# Patient Record
Sex: Female | Born: 1965
Health system: Southern US, Community
[De-identification: ages and names within clinical notes are randomized; demographics above are authoritative.]

## PROBLEM LIST (undated history)

## (undated) DIAGNOSIS — F988 Other specified behavioral and emotional disorders with onset usually occurring in childhood and adolescence: Secondary | ICD-10-CM

## (undated) DIAGNOSIS — E538 Deficiency of other specified B group vitamins: Secondary | ICD-10-CM

## (undated) DIAGNOSIS — C801 Malignant (primary) neoplasm, unspecified: Secondary | ICD-10-CM

## (undated) DIAGNOSIS — E039 Hypothyroidism, unspecified: Secondary | ICD-10-CM

## (undated) HISTORY — DX: Other specified behavioral and emotional disorders with onset usually occurring in childhood and adolescence: F98.8

## (undated) HISTORY — DX: Deficiency of other specified B group vitamins: E53.8

## (undated) HISTORY — PX: ANAL FISSURE REPAIR: SHX2312

## (undated) HISTORY — DX: Malignant (primary) neoplasm, unspecified: C80.1

## (undated) HISTORY — DX: Hypothyroidism, unspecified: E03.9

## (undated) HISTORY — PX: OTHER SURGICAL HISTORY: SHX169

---

## 2003-12-08 ENCOUNTER — Encounter: Admission: RE | Admit: 2003-12-08 | Discharge: 2003-12-08 | Payer: Self-pay | Admitting: Family Medicine

## 2004-01-22 ENCOUNTER — Ambulatory Visit (HOSPITAL_COMMUNITY): Admission: RE | Admit: 2004-01-22 | Discharge: 2004-01-22 | Payer: Self-pay | Admitting: Family Medicine

## 2004-04-24 ENCOUNTER — Other Ambulatory Visit: Admission: RE | Admit: 2004-04-24 | Discharge: 2004-04-24 | Payer: Self-pay | Admitting: Obstetrics and Gynecology

## 2004-08-02 ENCOUNTER — Encounter: Admission: RE | Admit: 2004-08-02 | Discharge: 2004-08-02 | Payer: Self-pay | Admitting: Family Medicine

## 2004-09-01 ENCOUNTER — Ambulatory Visit: Payer: Self-pay | Admitting: Family Medicine

## 2004-12-27 ENCOUNTER — Ambulatory Visit: Payer: Self-pay | Admitting: Family Medicine

## 2005-07-10 ENCOUNTER — Ambulatory Visit: Payer: Self-pay | Admitting: Family Medicine

## 2005-07-13 ENCOUNTER — Ambulatory Visit: Payer: Self-pay | Admitting: Family Medicine

## 2005-07-18 ENCOUNTER — Ambulatory Visit: Payer: Self-pay | Admitting: Family Medicine

## 2005-08-01 ENCOUNTER — Other Ambulatory Visit: Admission: RE | Admit: 2005-08-01 | Discharge: 2005-08-01 | Payer: Self-pay | Admitting: Obstetrics and Gynecology

## 2005-12-27 ENCOUNTER — Ambulatory Visit: Payer: Self-pay | Admitting: Family Medicine

## 2006-01-11 ENCOUNTER — Ambulatory Visit: Payer: Self-pay | Admitting: Family Medicine

## 2006-01-31 ENCOUNTER — Ambulatory Visit (HOSPITAL_COMMUNITY): Admission: RE | Admit: 2006-01-31 | Discharge: 2006-01-31 | Payer: Self-pay | Admitting: General Surgery

## 2006-08-07 ENCOUNTER — Ambulatory Visit: Payer: Self-pay | Admitting: Family Medicine

## 2006-09-26 ENCOUNTER — Ambulatory Visit: Payer: Self-pay | Admitting: Internal Medicine

## 2007-01-21 DIAGNOSIS — E039 Hypothyroidism, unspecified: Secondary | ICD-10-CM | POA: Insufficient documentation

## 2007-01-21 DIAGNOSIS — F988 Other specified behavioral and emotional disorders with onset usually occurring in childhood and adolescence: Secondary | ICD-10-CM | POA: Insufficient documentation

## 2007-01-21 DIAGNOSIS — J302 Other seasonal allergic rhinitis: Secondary | ICD-10-CM | POA: Insufficient documentation

## 2007-01-21 DIAGNOSIS — J3089 Other allergic rhinitis: Secondary | ICD-10-CM

## 2007-01-21 DIAGNOSIS — Z9889 Other specified postprocedural states: Secondary | ICD-10-CM | POA: Insufficient documentation

## 2007-02-14 ENCOUNTER — Ambulatory Visit: Payer: Self-pay | Admitting: Family Medicine

## 2007-02-14 LAB — CONVERTED CEMR LAB
T3, Free: 2.7 pg/mL (ref 2.3–4.2)
T4, Total: 11.5 ug/dL (ref 5.0–12.5)
TSH: 0.997 microintl units/mL (ref 0.350–5.50)

## 2007-03-04 ENCOUNTER — Telehealth (INDEPENDENT_AMBULATORY_CARE_PROVIDER_SITE_OTHER): Payer: Self-pay | Admitting: *Deleted

## 2007-03-24 ENCOUNTER — Telehealth (INDEPENDENT_AMBULATORY_CARE_PROVIDER_SITE_OTHER): Payer: Self-pay | Admitting: *Deleted

## 2007-04-22 ENCOUNTER — Telehealth (INDEPENDENT_AMBULATORY_CARE_PROVIDER_SITE_OTHER): Payer: Self-pay | Admitting: *Deleted

## 2007-05-23 ENCOUNTER — Telehealth (INDEPENDENT_AMBULATORY_CARE_PROVIDER_SITE_OTHER): Payer: Self-pay | Admitting: *Deleted

## 2007-06-23 ENCOUNTER — Telehealth (INDEPENDENT_AMBULATORY_CARE_PROVIDER_SITE_OTHER): Payer: Self-pay | Admitting: *Deleted

## 2007-07-21 ENCOUNTER — Telehealth (INDEPENDENT_AMBULATORY_CARE_PROVIDER_SITE_OTHER): Payer: Self-pay | Admitting: *Deleted

## 2007-08-20 ENCOUNTER — Telehealth (INDEPENDENT_AMBULATORY_CARE_PROVIDER_SITE_OTHER): Payer: Self-pay | Admitting: *Deleted

## 2007-09-19 ENCOUNTER — Telehealth (INDEPENDENT_AMBULATORY_CARE_PROVIDER_SITE_OTHER): Payer: Self-pay | Admitting: *Deleted

## 2007-10-22 ENCOUNTER — Telehealth (INDEPENDENT_AMBULATORY_CARE_PROVIDER_SITE_OTHER): Payer: Self-pay | Admitting: *Deleted

## 2007-11-06 ENCOUNTER — Ambulatory Visit: Payer: Self-pay | Admitting: Family Medicine

## 2007-11-06 DIAGNOSIS — J4520 Mild intermittent asthma, uncomplicated: Secondary | ICD-10-CM | POA: Insufficient documentation

## 2007-11-20 ENCOUNTER — Telehealth (INDEPENDENT_AMBULATORY_CARE_PROVIDER_SITE_OTHER): Payer: Self-pay | Admitting: *Deleted

## 2007-12-18 ENCOUNTER — Telehealth (INDEPENDENT_AMBULATORY_CARE_PROVIDER_SITE_OTHER): Payer: Self-pay | Admitting: *Deleted

## 2008-01-19 ENCOUNTER — Telehealth (INDEPENDENT_AMBULATORY_CARE_PROVIDER_SITE_OTHER): Payer: Self-pay | Admitting: *Deleted

## 2008-02-17 ENCOUNTER — Telehealth (INDEPENDENT_AMBULATORY_CARE_PROVIDER_SITE_OTHER): Payer: Self-pay | Admitting: *Deleted

## 2008-03-11 ENCOUNTER — Ambulatory Visit: Payer: Self-pay | Admitting: Family Medicine

## 2008-03-12 ENCOUNTER — Encounter (INDEPENDENT_AMBULATORY_CARE_PROVIDER_SITE_OTHER): Payer: Self-pay | Admitting: *Deleted

## 2008-03-12 LAB — CONVERTED CEMR LAB: TSH: 0.31 microintl units/mL — ABNORMAL LOW (ref 0.35–5.50)

## 2008-03-24 ENCOUNTER — Telehealth (INDEPENDENT_AMBULATORY_CARE_PROVIDER_SITE_OTHER): Payer: Self-pay | Admitting: *Deleted

## 2008-03-24 DIAGNOSIS — K219 Gastro-esophageal reflux disease without esophagitis: Secondary | ICD-10-CM | POA: Insufficient documentation

## 2008-03-25 ENCOUNTER — Encounter (INDEPENDENT_AMBULATORY_CARE_PROVIDER_SITE_OTHER): Payer: Self-pay | Admitting: *Deleted

## 2008-04-19 ENCOUNTER — Telehealth (INDEPENDENT_AMBULATORY_CARE_PROVIDER_SITE_OTHER): Payer: Self-pay | Admitting: *Deleted

## 2008-04-20 ENCOUNTER — Ambulatory Visit: Payer: Self-pay | Admitting: Gastroenterology

## 2008-04-29 ENCOUNTER — Ambulatory Visit: Payer: Self-pay | Admitting: Gastroenterology

## 2008-05-07 ENCOUNTER — Ambulatory Visit: Payer: Self-pay | Admitting: Family Medicine

## 2008-05-07 DIAGNOSIS — J029 Acute pharyngitis, unspecified: Secondary | ICD-10-CM | POA: Insufficient documentation

## 2008-05-07 LAB — CONVERTED CEMR LAB: Rapid Strep: NEGATIVE

## 2008-05-08 ENCOUNTER — Encounter: Payer: Self-pay | Admitting: Family Medicine

## 2008-05-10 ENCOUNTER — Encounter (INDEPENDENT_AMBULATORY_CARE_PROVIDER_SITE_OTHER): Payer: Self-pay | Admitting: *Deleted

## 2008-05-10 ENCOUNTER — Telehealth (INDEPENDENT_AMBULATORY_CARE_PROVIDER_SITE_OTHER): Payer: Self-pay | Admitting: *Deleted

## 2008-05-10 ENCOUNTER — Encounter: Payer: Self-pay | Admitting: Family Medicine

## 2008-05-12 ENCOUNTER — Encounter: Payer: Self-pay | Admitting: Family Medicine

## 2008-05-18 ENCOUNTER — Telehealth (INDEPENDENT_AMBULATORY_CARE_PROVIDER_SITE_OTHER): Payer: Self-pay | Admitting: *Deleted

## 2008-06-15 ENCOUNTER — Telehealth (INDEPENDENT_AMBULATORY_CARE_PROVIDER_SITE_OTHER): Payer: Self-pay | Admitting: *Deleted

## 2008-06-16 ENCOUNTER — Ambulatory Visit: Payer: Self-pay | Admitting: Family Medicine

## 2008-06-16 ENCOUNTER — Telehealth (INDEPENDENT_AMBULATORY_CARE_PROVIDER_SITE_OTHER): Payer: Self-pay | Admitting: *Deleted

## 2008-06-16 LAB — CONVERTED CEMR LAB: TSH: 0.38 microintl units/mL (ref 0.35–5.50)

## 2008-06-17 ENCOUNTER — Encounter (INDEPENDENT_AMBULATORY_CARE_PROVIDER_SITE_OTHER): Payer: Self-pay | Admitting: *Deleted

## 2008-07-19 ENCOUNTER — Telehealth (INDEPENDENT_AMBULATORY_CARE_PROVIDER_SITE_OTHER): Payer: Self-pay | Admitting: *Deleted

## 2008-07-28 ENCOUNTER — Ambulatory Visit: Payer: Self-pay | Admitting: Family Medicine

## 2008-08-17 ENCOUNTER — Telehealth (INDEPENDENT_AMBULATORY_CARE_PROVIDER_SITE_OTHER): Payer: Self-pay | Admitting: *Deleted

## 2008-09-16 ENCOUNTER — Telehealth (INDEPENDENT_AMBULATORY_CARE_PROVIDER_SITE_OTHER): Payer: Self-pay | Admitting: *Deleted

## 2008-10-18 ENCOUNTER — Telehealth (INDEPENDENT_AMBULATORY_CARE_PROVIDER_SITE_OTHER): Payer: Self-pay | Admitting: *Deleted

## 2008-11-17 ENCOUNTER — Telehealth (INDEPENDENT_AMBULATORY_CARE_PROVIDER_SITE_OTHER): Payer: Self-pay | Admitting: *Deleted

## 2008-11-29 ENCOUNTER — Ambulatory Visit: Payer: Self-pay | Admitting: Family Medicine

## 2008-12-16 ENCOUNTER — Telehealth (INDEPENDENT_AMBULATORY_CARE_PROVIDER_SITE_OTHER): Payer: Self-pay | Admitting: *Deleted

## 2009-01-17 ENCOUNTER — Telehealth (INDEPENDENT_AMBULATORY_CARE_PROVIDER_SITE_OTHER): Payer: Self-pay | Admitting: *Deleted

## 2009-02-15 ENCOUNTER — Telehealth (INDEPENDENT_AMBULATORY_CARE_PROVIDER_SITE_OTHER): Payer: Self-pay | Admitting: *Deleted

## 2009-03-14 ENCOUNTER — Telehealth (INDEPENDENT_AMBULATORY_CARE_PROVIDER_SITE_OTHER): Payer: Self-pay | Admitting: *Deleted

## 2009-04-14 ENCOUNTER — Telehealth (INDEPENDENT_AMBULATORY_CARE_PROVIDER_SITE_OTHER): Payer: Self-pay | Admitting: *Deleted

## 2009-05-18 ENCOUNTER — Telehealth (INDEPENDENT_AMBULATORY_CARE_PROVIDER_SITE_OTHER): Payer: Self-pay | Admitting: *Deleted

## 2009-06-14 ENCOUNTER — Telehealth (INDEPENDENT_AMBULATORY_CARE_PROVIDER_SITE_OTHER): Payer: Self-pay | Admitting: *Deleted

## 2009-07-11 ENCOUNTER — Ambulatory Visit: Payer: Self-pay | Admitting: Family Medicine

## 2009-07-12 ENCOUNTER — Encounter: Payer: Self-pay | Admitting: Family Medicine

## 2009-07-12 LAB — CONVERTED CEMR LAB
Free T4: 1.2 ng/dL (ref 0.6–1.6)
T3, Free: 2.3 pg/mL (ref 2.3–4.2)
TSH: 0.41 microintl units/mL (ref 0.35–5.50)

## 2009-07-13 ENCOUNTER — Encounter: Payer: Self-pay | Admitting: Family Medicine

## 2009-08-09 ENCOUNTER — Telehealth (INDEPENDENT_AMBULATORY_CARE_PROVIDER_SITE_OTHER): Payer: Self-pay | Admitting: *Deleted

## 2009-08-15 ENCOUNTER — Telehealth (INDEPENDENT_AMBULATORY_CARE_PROVIDER_SITE_OTHER): Payer: Self-pay | Admitting: *Deleted

## 2009-09-12 ENCOUNTER — Telehealth (INDEPENDENT_AMBULATORY_CARE_PROVIDER_SITE_OTHER): Payer: Self-pay | Admitting: *Deleted

## 2009-10-12 ENCOUNTER — Telehealth (INDEPENDENT_AMBULATORY_CARE_PROVIDER_SITE_OTHER): Payer: Self-pay | Admitting: *Deleted

## 2009-11-14 ENCOUNTER — Telehealth (INDEPENDENT_AMBULATORY_CARE_PROVIDER_SITE_OTHER): Payer: Self-pay | Admitting: *Deleted

## 2009-12-12 ENCOUNTER — Telehealth (INDEPENDENT_AMBULATORY_CARE_PROVIDER_SITE_OTHER): Payer: Self-pay | Admitting: *Deleted

## 2010-01-11 ENCOUNTER — Telehealth (INDEPENDENT_AMBULATORY_CARE_PROVIDER_SITE_OTHER): Payer: Self-pay | Admitting: *Deleted

## 2010-02-09 ENCOUNTER — Telehealth (INDEPENDENT_AMBULATORY_CARE_PROVIDER_SITE_OTHER): Payer: Self-pay | Admitting: *Deleted

## 2010-02-14 ENCOUNTER — Ambulatory Visit: Payer: Self-pay | Admitting: Family Medicine

## 2010-03-13 ENCOUNTER — Telehealth (INDEPENDENT_AMBULATORY_CARE_PROVIDER_SITE_OTHER): Payer: Self-pay | Admitting: *Deleted

## 2010-04-05 ENCOUNTER — Telehealth (INDEPENDENT_AMBULATORY_CARE_PROVIDER_SITE_OTHER): Payer: Self-pay | Admitting: *Deleted

## 2010-05-11 ENCOUNTER — Telehealth (INDEPENDENT_AMBULATORY_CARE_PROVIDER_SITE_OTHER): Payer: Self-pay | Admitting: *Deleted

## 2010-06-12 ENCOUNTER — Telehealth (INDEPENDENT_AMBULATORY_CARE_PROVIDER_SITE_OTHER): Payer: Self-pay | Admitting: *Deleted

## 2010-07-11 ENCOUNTER — Telehealth (INDEPENDENT_AMBULATORY_CARE_PROVIDER_SITE_OTHER): Payer: Self-pay | Admitting: *Deleted

## 2010-08-10 ENCOUNTER — Telehealth (INDEPENDENT_AMBULATORY_CARE_PROVIDER_SITE_OTHER): Payer: Self-pay | Admitting: *Deleted

## 2010-08-18 ENCOUNTER — Ambulatory Visit: Payer: Self-pay | Admitting: Family Medicine

## 2010-08-21 ENCOUNTER — Telehealth (INDEPENDENT_AMBULATORY_CARE_PROVIDER_SITE_OTHER): Payer: Self-pay | Admitting: *Deleted

## 2010-08-21 LAB — CONVERTED CEMR LAB: TSH: 0.06 microintl units/mL — ABNORMAL LOW (ref 0.35–5.50)

## 2010-09-08 ENCOUNTER — Telehealth (INDEPENDENT_AMBULATORY_CARE_PROVIDER_SITE_OTHER): Payer: Self-pay | Admitting: *Deleted

## 2010-10-10 ENCOUNTER — Telehealth (INDEPENDENT_AMBULATORY_CARE_PROVIDER_SITE_OTHER): Payer: Self-pay | Admitting: *Deleted

## 2010-10-31 NOTE — Progress Notes (Signed)
Summary: refill  Phone Note Refill Request   Refills Requested: Medication #1:  RITALIN 10 MG  TABS 1 by mouth three times a day  Follow-up for Phone Call        left message rx was ready for pick up. Army Fossa CMA  March 13, 2010 1:21 PM     Prescriptions: RITALIN 10 MG  TABS (METHYLPHENIDATE HCL) 1 by mouth three times a day  #90 x 0   Entered by:   Army Fossa CMA   Authorized by:   Loreen Freud DO   Signed by:   Army Fossa CMA on 03/13/2010   Method used:   Print then Give to Patient   RxID:   [id number]

## 2010-10-31 NOTE — Progress Notes (Signed)
  Phone Note Refill Request   Refills Requested: Medication #1:  RITALIN 10 MG  TABS 1 by mouth three times a day  Follow-up for Phone Call        Pt is aware , rx is ready for pick up. Army Fossa CMA  November 14, 2009 11:58 AM     Prescriptions: RITALIN 10 MG  TABS (METHYLPHENIDATE HCL) 1 by mouth three times a day  #90 x 0   Entered by:   Army Fossa CMA   Authorized by:   Loreen Freud DO   Signed by:   Army Fossa CMA on 11/14/2009   Method used:   Print then Give to Patient   RxID:   [id number]

## 2010-10-31 NOTE — Assessment & Plan Note (Signed)
Summary: rto 6 months/cbs   Vital Signs:  Patient profile:   45 year old female Height:      67 inches Weight:      160 pounds BMI:     25.15 Temp:     98.1 degrees F Pulse rate:   76 / minute BP sitting:   110 / 78  (left arm)  Vitals Entered By: Jeremy Johann CMA (August 18, 2010 12:56 PM) CC: 6 month f/u, labs not fasting   Current Medications (verified): 1)  Albuterol-Ipratropium 2.5-0.5 Mg/71ml Soln (Albuterol-Ipratropium) .Marland Kitchen.. 1 Nebulizer Every 6 Hours As Needed 2)  Albuterol 90 Mcg/act  Aers (Albuterol) .... 2 Inh Every4-6 Hours As Needed 3)  Ritalin 10 Mg  Tabs (Methylphenidate Hcl) .Marland Kitchen.. 1 By Mouth Three Times A Day 4)  Omeprazole 20 Mg Cpdr (Omeprazole) .Marland Kitchen.. 1 By Mouth Daily. 5)  Zyrtec Allergy 10 Mg Caps (Cetirizine Hcl) 6)  Synthroid 137 Mcg Tabs (Levothyroxine Sodium) .Marland Kitchen.. 1 By Mouth Once Daily  Allergies (verified): 1)  ! Pcn  Past History:  Past medical, surgical, family and social histories (including risk factors) reviewed for relevance to current acute and chronic problems.  Past Medical History: Reviewed history from 11/06/2007 and no changes required. Allergic rhinitis Hypothyroidism ADD Asthma  Past Surgical History: Reviewed history from 04/20/2008 and no changes required. Surgical procedure anal fissure Surgery on spincture muscle lasic eye surgery  Family History: Reviewed history from 04/20/2008 and no changes required. Family History of Breast Cancer:Paternal grandmother Family History of Diabetes: Maternal Grandfather  Social History: Reviewed history from 04/20/2008 and no changes required. Homemaker Patient has never smoked.  Alcohol Use - yes Daily Caffeine Use 1-2 cups coffee Illicit Drug Use - no Patient gets regular exercise.  Review of Systems      See HPI  Physical Exam  General:  Well-developed,well-nourished,in no acute distress; alert,appropriate and cooperative throughout examination Neck:  No deformities,  masses, or tenderness noted. Lungs:  Normal respiratory effort, chest expands symmetrically. Lungs are clear to auscultation, no crackles or wheezes. Heart:  normal rate and no murmur.  normal rate and no murmur.   Psych:  Cognition and judgment appear intact. Alert and cooperative with normal attention span and concentration. No apparent delusions, illusions, hallucinations   Impression & Recommendations:  Problem # 1:  ADD (ICD-314.00) con't ritalin  rto 6 months  Problem # 2:  HYPOTHYROIDISM (ICD-244.9)  Her updated medication list for this problem includes:    Synthroid 137 Mcg Tabs (Levothyroxine sodium) .Marland Kitchen... 1 by mouth once daily  Orders: Venipuncture (04540) TLB-TSH (Thyroid Stimulating Hormone) (84443-TSH) Specimen Handling (98119)  Labs Reviewed: TSH: 0.41 (07/11/2009)   Free T4: 1.2 (07/11/2009)     Complete Medication List: 1)  Albuterol-ipratropium 2.5-0.5 Mg/4ml Soln (Albuterol-ipratropium) .Marland Kitchen.. 1 nebulizer every 6 hours as needed 2)  Albuterol 90 Mcg/act Aers (Albuterol) .... 2 inh every4-6 hours as needed 3)  Ritalin 10 Mg Tabs (Methylphenidate hcl) .Marland Kitchen.. 1 by mouth three times a day 4)  Omeprazole 20 Mg Cpdr (Omeprazole) .Marland Kitchen.. 1 by mouth daily. 5)  Zyrtec Allergy 10 Mg Caps (Cetirizine hcl) 6)  Synthroid 137 Mcg Tabs (Levothyroxine sodium) .Marland Kitchen.. 1 by mouth once daily  Patient Instructions: 1)  schedule cpe 6 months with fasting labs Prescriptions: SYNTHROID 137 MCG TABS (LEVOTHYROXINE SODIUM) 1 by mouth once daily  #30 Tablet x 11   Entered and Authorized by:   Loreen Freud DO   Signed by:   Loreen Freud DO on 08/18/2010  Method used:   Print then Give to Patient   RxID:   [id number]    Orders Added: 1)  Venipuncture [36415] 2)  TLB-TSH (Thyroid Stimulating Hormone) [84443-TSH] 3)  Specimen Handling [99000] 4)  Est. Patient Level III [14782]

## 2010-10-31 NOTE — Progress Notes (Signed)
Summary: refill  Phone Note Refill Request Call back at Home Phone [phone] Message from:  Patient  Refills Requested: Medication #1:  RITALIN 10 MG  TABS 1 by mouth three times a day patient goiing out of town [address] - will be gone over a week - will pick up 098119 before noon  Initial call taken by: Okey Regal Spring,  April 05, 2010 9:15 AM    Prescriptions: RITALIN 10 MG  TABS (METHYLPHENIDATE HCL) 1 by mouth three times a day  #90 x 0   Entered by:   Jeremy Johann CMA   Authorized by:   Loreen Freud DO   Signed by:   Jeremy Johann CMA on 04/06/2010   Method used:   Print then Give to Patient   RxID:   [id number]

## 2010-10-31 NOTE — Progress Notes (Signed)
Summary: refill  Phone Note Refill Request Call back at Home Phone [phone] Message from:  Patient on May 11, 2010 8:47 AM  Refills Requested: Medication #1:  RITALIN 10 MG  TABS 1 by mouth three times a day call patient when ready - wants to pick up [id number]  Initial call taken by: Okey Regal Spring,  May 11, 2010 8:49 AM    Prescriptions: RITALIN 10 MG  TABS (METHYLPHENIDATE HCL) 1 by mouth three times a day  #90 x 0   Entered by:   Jeremy Johann CMA   Authorized by:   Marga Melnick MD   Signed by:   Jeremy Johann CMA on 05/11/2010   Method used:   Print then Give to Patient   RxID:   [id number]

## 2010-10-31 NOTE — Progress Notes (Signed)
Summary: Ritalin refill--needs by end of day  Phone Note Refill Request Call back at Home Phone [phone] Message from:  Patient on September 08, 2010 9:37 AM  Refills Requested: Medication #1:  RITALIN 10 MG  TABS 1 by mouth three times a day will run out of meds by Sunday night---needs to pickup prescription late this afternoon.  thanks  Next Appointment Scheduled: none Initial call taken by: Jerolyn Shin,  September 08, 2010 9:38 AM  Follow-up for Phone Call        pt aware Rx ready for pick up Follow-up by: Almeta Monas CMA Duncan Dull),  September 08, 2010 10:19 AM    Prescriptions: RITALIN 10 MG  TABS (METHYLPHENIDATE HCL) 1 by mouth three times a day  #90 x 0   Entered by:   Almeta Monas CMA (AAMA)   Authorized by:   Loreen Freud DO   Signed by:   Almeta Monas CMA (AAMA) on 09/08/2010   Method used:   Print then Give to Patient   RxID:   [id number]

## 2010-10-31 NOTE — Progress Notes (Signed)
  Phone Note Refill Request   Refills Requested: Medication #1:  RITALIN 10 MG  TABS 1 by mouth three times a day  Follow-up for Phone Call        pt has appt 02/14/10. Army Fossa CMA  Feb 09, 2010 9:55 AM     Prescriptions: RITALIN 10 MG  TABS (METHYLPHENIDATE HCL) 1 by mouth three times a day  #90 x 0   Entered by:   Army Fossa CMA   Authorized by:   Loreen Freud DO   Signed by:   Army Fossa CMA on 02/09/2010   Method used:   Print then Give to Patient   RxID:   [id number]

## 2010-10-31 NOTE — Progress Notes (Signed)
Summary: 11-21--lab results  Phone Note Outgoing Call   Call placed by: [facility] CMA,  August 21, 2010 3:07 PM Details for Reason: Pt is hyperthyroid----decrease synthroid to 125 micrograms  #0  1 by mouth once daily ,  2 refills---recheck 2 months 244.9  TSH Summary of Call: left message to call office .............Marland KitchenFelecia Deloach CMA  August 21, 2010 3:07 PM   Follow-up for Phone Call        spoke w/ patient aware of labs and prescription sent to pharmacy........Marland KitchenDoristine Devoid CMA  August 21, 2010 3:18 PM     New/Updated Medications: SYNTHROID 125 MCG TABS (LEVOTHYROXINE SODIUM) take one tablet daily Prescriptions: SYNTHROID 125 MCG TABS (LEVOTHYROXINE SODIUM) take one tablet daily  #30 x 2   Entered by:   Doristine Devoid CMA   Authorized by:   Loreen Freud DO   Signed by:   Doristine Devoid CMA on 08/21/2010   Method used:   Electronically to        [facility]  [place] [address] (retail)       [address]       [place], [place]  [address]       Ph: [phone] or [phone]       Fax: [phone]   RxID:   [id number]

## 2010-10-31 NOTE — Progress Notes (Signed)
Summary: RETALIN REFILL  Phone Note Call from Patient Call back at Home Phone [phone]   Caller: Patient Summary of Call: NEEDS PRESCRIPTION FOR RITALIN 10MG  -----TAKES  3X A DAY----ADVISED PT IT WOULD BE READY AFTER 1PM TOMORROW UNLESS WE CALLED HER Initial call taken by: Jerolyn Shin,  July 11, 2010 1:07 PM    Prescriptions: RITALIN 10 MG  TABS (METHYLPHENIDATE HCL) 1 by mouth three times a day  #90 x 0   Entered by:   Almeta Monas CMA (AAMA)   Authorized by:   Loreen Freud DO   Signed by:   Almeta Monas CMA (AAMA) on 07/11/2010   Method used:   Print then Give to Patient   RxID:   [id number]

## 2010-10-31 NOTE — Progress Notes (Signed)
Summary: refill  Phone Note Refill Request Message from:  Patient  Refills Requested: Medication #1:  RITALIN 10 MG  TABS 1 by mouth three times a day patient will pick up tuesday  [id number] around noon  Initial call taken by: Okey Regal Spring,  June 12, 2010 11:30 AM    Prescriptions: RITALIN 10 MG  TABS (METHYLPHENIDATE HCL) 1 by mouth three times a day  #90 x 0   Entered by:   Almeta Monas CMA (AAMA)   Authorized by:   Loreen Freud DO   Signed by:   Almeta Monas CMA (AAMA) on 06/12/2010   Method used:   Print then Give to Patient   RxID:   [id number]

## 2010-10-31 NOTE — Assessment & Plan Note (Signed)
Summary: follow up on meds/drb   Vital Signs:  Patient profile:   45 year old female Weight:      158 pounds Pulse rate:   82 / minute Pulse rhythm:   regular BP sitting:   116 / 76  (left arm) Cuff size:   regular  Vitals Entered By: Army Fossa CMA (Feb 14, 2010 12:43 PM) CC: Pt here for follow up on Ritalin   History of Present Illness: Pt here for f/u ritalin.  No complaints.    Current Medications (verified): 1)  Albuterol-Ipratropium 2.5-0.5 Mg/24ml Soln (Albuterol-Ipratropium) .Marland Kitchen.. 1 Nebulizer Every 6 Hours As Needed 2)  Albuterol 90 Mcg/act  Aers (Albuterol) .... 2 Inh Every4-6 Hours As Needed 3)  Ritalin 10 Mg  Tabs (Methylphenidate Hcl) .Marland Kitchen.. 1 By Mouth Three Times A Day 4)  Nexium 40 Mg Cpdr (Esomeprazole Magnesium) .... Take One Capsule Daily Before Breakfast 5)  Omeprazole 20 Mg Cpdr (Omeprazole) .Marland Kitchen.. 1 By Mouth Daily. 6)  Zyrtec Allergy 10 Mg Caps (Cetirizine Hcl) 7)  Synthroid 137 Mcg Tabs (Levothyroxine Sodium) .Marland Kitchen.. 1 By Mouth Once Daily  Allergies: 1)  ! Pcn  Past History:  Past medical, surgical, family and social histories (including risk factors) reviewed for relevance to current acute and chronic problems.  Past Medical History: Reviewed history from 11/06/2007 and no changes required. Allergic rhinitis Hypothyroidism ADD Asthma  Past Surgical History: Reviewed history from 04/20/2008 and no changes required. Surgical procedure anal fissure Surgery on spincture muscle lasic eye surgery  Family History: Reviewed history from 04/20/2008 and no changes required. Family History of Breast Cancer:Paternal grandmother Family History of Diabetes: Maternal Grandfather  Social History: Reviewed history from 04/20/2008 and no changes required. Homemaker Patient has never smoked.  Alcohol Use - yes Daily Caffeine Use 1-2 cups coffee Illicit Drug Use - no Patient gets regular exercise.  Review of Systems      See HPI  Physical  Exam  General:  Well-developed,well-nourished,in no acute distress; alert,appropriate and cooperative throughout examination Lungs:  Normal respiratory effort, chest expands symmetrically. Lungs are clear to auscultation, no crackles or wheezes. Heart:  normal rate and no murmur.   Psych:  Oriented X3 and normally interactive.     Impression & Recommendations:  Problem # 1:  ADD (ICD-314.00) con't ritalin  rto 6 months  Complete Medication List: 1)  Albuterol-ipratropium 2.5-0.5 Mg/44ml Soln (Albuterol-ipratropium) .Marland Kitchen.. 1 nebulizer every 6 hours as needed 2)  Albuterol 90 Mcg/act Aers (Albuterol) .... 2 inh every4-6 hours as needed 3)  Ritalin 10 Mg Tabs (Methylphenidate hcl) .Marland Kitchen.. 1 by mouth three times a day 4)  Nexium 40 Mg Cpdr (Esomeprazole magnesium) .... Take one capsule daily before breakfast 5)  Omeprazole 20 Mg Cpdr (Omeprazole) .Marland Kitchen.. 1 by mouth daily. 6)  Zyrtec Allergy 10 Mg Caps (Cetirizine hcl) 7)  Synthroid 137 Mcg Tabs (Levothyroxine sodium) .Marland Kitchen.. 1 by mouth once daily  Patient Instructions: 1)  Please schedule a follow-up appointment in 6 months .

## 2010-10-31 NOTE — Progress Notes (Signed)
Summary: Refill  Phone Note Refill Request Call back at Work Phone [phone] Message from:  Patient on October 12, 2009 12:00 PM  Refills Requested: Medication #1:  RITALIN 10 MG  TABS 1 by mouth three times a day  Method Requested: Pick up at Office Next Appointment Scheduled: No future Appointments Initial call taken by: Barnie Mort,  October 12, 2009 12:00 PM  Follow-up for Phone Call        pt aware rx is ready. Army Fossa CMA  October 12, 2009 12:21 PM    Prescriptions: RITALIN 10 MG  TABS (METHYLPHENIDATE HCL) 1 by mouth three times a day  #90 x 0   Entered by:   Army Fossa CMA   Authorized by:   Loreen Freud DO   Signed by:   Army Fossa CMA on 10/12/2009   Method used:   Print then Give to Patient   RxID:   [id number]

## 2010-10-31 NOTE — Progress Notes (Signed)
  Phone Note Refill Request   Refills Requested: Medication #1:  RITALIN 10 MG  TABS 1 by mouth three times a day Pt called requesting refill, refilled this month- will need OV for further refills. Rx will be ready 01/12/10 a.m.Army Fossa CMA  January 11, 2010 1:47 PM      Prescriptions: RITALIN 10 MG  TABS (METHYLPHENIDATE HCL) 1 by mouth three times a day  #90 x 0   Entered by:   Army Fossa CMA   Authorized by:   Loreen Freud DO   Signed by:   Army Fossa CMA on 01/11/2010   Method used:   Print then Give to Patient   RxID:   [id number]

## 2010-10-31 NOTE — Progress Notes (Signed)
Summary: refill  Phone Note Refill Request Message from:  Patient  Refills Requested: Medication #1:  RITALIN 10 MG  TABS 1 by mouth three times a day patient will pick up 111111  Initial call taken by: Okey Regal Spring,  August 10, 2010 11:21 AM    Prescriptions: RITALIN 10 MG  TABS (METHYLPHENIDATE HCL) 1 by mouth three times a day  #90 x 0   Entered by:   Almeta Monas CMA (AAMA)   Authorized by:   Loreen Freud DO   Signed by:   Almeta Monas CMA (AAMA) on 08/10/2010   Method used:   Print then Give to Patient   RxID:   [id number]

## 2010-10-31 NOTE — Progress Notes (Signed)
  Phone Note Refill Request   Refills Requested: Medication #1:  RITALIN 10 MG  TABS 1 by mouth three times a day   Last Refilled: 11/14/2009  Follow-up for Phone Call        Pt is aware rx ready for pick up. Army Fossa CMA  December 12, 2009 3:19 PM     Prescriptions: RITALIN 10 MG  TABS (METHYLPHENIDATE HCL) 1 by mouth three times a day  #90 x 0   Entered by:   Army Fossa CMA   Authorized by:   Loreen Freud DO   Signed by:   Army Fossa CMA on 12/12/2009   Method used:   Print then Give to Patient   RxID:   [id number]

## 2010-11-02 NOTE — Progress Notes (Signed)
Summary: Refill Request  Phone Note Refill Request Call back at Home Phone [phone] Message from:  Pharmacy on October 10, 2010 2:30 PM  Refills Requested: Medication #1:  RITALIN 10 MG  TABS 1 by mouth three times a day   Dosage confirmed as above?Dosage Confirmed   Supply Requested: 1 month   Last Refilled: 09/08/2010 Please call when ready   Method Requested: Pick up at Office Next Appointment Scheduled: none Initial call taken by: Harold Barban,  October 10, 2010 2:30 PM  Follow-up for Phone Call        pt aware Rx ready for pick up.... Almeta Monas CMA Duncan D[place])  October 10, 2010 3:43 PM     Prescriptions: RITALIN 10 MG  TABS (METHYLPHENIDATE HCL) 1 by mouth three times a day  #90 x 0   Entered by:   Almeta Monas CMA (AAMA)   Authorized by:   Loreen Freud DO   Signed by:   Almeta Monas CMA (AAMA) on 10/10/2010   Method used:   Print then Give to Patient   RxID:   [id number]

## 2010-11-10 ENCOUNTER — Telehealth (INDEPENDENT_AMBULATORY_CARE_PROVIDER_SITE_OTHER): Payer: Self-pay | Admitting: *Deleted

## 2010-11-16 NOTE — Progress Notes (Signed)
Summary: RX  Phone Note Call from Patient Call back at Home Phone [phone]   Caller: Patient Reason for Call: Refill Medication Summary of Call: RITALIN 10 MG 3 TIME A DAY. Initial call taken by: Freddy Jaksch,  November 10, 2010 8:12 AM  Follow-up for Phone Call        pt aware Rx ready for pick up..... Almeta Monas CMA Duncan D[place])  November 10, 2010 11:03 AM  Follow-up by: Almeta Monas CMA Duncan [place]),  November 10, 2010 11:03 AM    Prescriptions: RITALIN 10 MG  TABS (METHYLPHENIDATE HCL) 1 by mouth three times a day  #90 x 0   Entered by:   Almeta Monas CMA (AAMA)   Authorized by:   Loreen Freud DO   Signed by:   Almeta Monas CMA (AAMA) on 11/10/2010   Method used:   Print then Give to Patient   RxID:   [id number]

## 2010-12-11 ENCOUNTER — Telehealth (INDEPENDENT_AMBULATORY_CARE_PROVIDER_SITE_OTHER): Payer: Self-pay | Admitting: *Deleted

## 2010-12-18 ENCOUNTER — Encounter (INDEPENDENT_AMBULATORY_CARE_PROVIDER_SITE_OTHER): Payer: Self-pay | Admitting: *Deleted

## 2010-12-19 NOTE — Progress Notes (Signed)
Summary: refill  Phone Note Refill Request Call back at Home Phone [phone] Message from:  Patient  Refills Requested: Medication #1:  RITALIN 10 MG  TABS 1 by mouth three times a day Pt aware Rx ready for pick up  Initial call taken by: Jeremy Johann CMA,  December 11, 2010 8:20 AM    Prescriptions: RITALIN 10 MG  TABS (METHYLPHENIDATE HCL) 1 by mouth three times a day  #90 x 0   Entered by:   Jeremy Johann CMA   Authorized by:   Loreen Freud DO   Signed by:   Jeremy Johann CMA on 12/11/2010   Method used:   Print then Give to Patient   RxID:   [id number]

## 2010-12-28 NOTE — Letter (Signed)
Summary: Primary Care Appointment Letter  [facility] [place]  [address] [place], [place] [address]   Phone: [phone]  Fax: [phone]    12/18/2010 MRN: [id number]     [facility] [place] [address] [place], [place]  [address]      Dear Ms. Mcmannis,   Your Primary Care Physician Loreen Freud DO has indicated that:    ___X____it is time to schedule an appointment for labs---244.9  TSH.    _______you missed your appointment on______ and need to call and          reschedule.    _______you need to have lab work done.    _______you need to schedule an appointment discuss lab or test results.    _______you need to call to reschedule your appointment that is                       scheduled on _________.     Please call our office as soon as possible. Our phone number is [phone]. Please press option 1. Our office is open 8a-5p, Monday through Friday.     Thank you, [place] [facility] Scheduler

## 2011-01-09 ENCOUNTER — Other Ambulatory Visit: Payer: Self-pay | Admitting: *Deleted

## 2011-01-09 MED ORDER — METHYLPHENIDATE HCL 10 MG PO TABS: 10.0000 mg | ORAL_TABLET | Freq: Three times a day (TID) | ORAL | Status: DC

## 2011-01-09 NOTE — Telephone Encounter (Signed)
Left Pt detail message Rx ready for pickup 

## 2011-01-17 ENCOUNTER — Other Ambulatory Visit: Payer: Self-pay | Admitting: Family Medicine

## 2011-01-24 ENCOUNTER — Other Ambulatory Visit: Payer: Self-pay

## 2011-01-24 MED ORDER — OMEPRAZOLE 20 MG PO CPDR: 20.0000 mg | DELAYED_RELEASE_CAPSULE | Freq: Every day | ORAL | Status: DC

## 2011-01-31 ENCOUNTER — Other Ambulatory Visit: Payer: Self-pay

## 2011-01-31 ENCOUNTER — Other Ambulatory Visit (INDEPENDENT_AMBULATORY_CARE_PROVIDER_SITE_OTHER): Payer: 59

## 2011-01-31 DIAGNOSIS — E039 Hypothyroidism, unspecified: Secondary | ICD-10-CM

## 2011-01-31 MED ORDER — LEVOTHYROXINE SODIUM 125 MCG PO TABS: 125.0000 ug | ORAL_TABLET | Freq: Every day | ORAL | Status: DC

## 2011-02-09 ENCOUNTER — Other Ambulatory Visit: Payer: Self-pay | Admitting: *Deleted

## 2011-02-09 MED ORDER — METHYLPHENIDATE HCL 10 MG PO TABS: 10.0000 mg | ORAL_TABLET | Freq: Three times a day (TID) | ORAL | Status: DC

## 2011-02-09 NOTE — Telephone Encounter (Signed)
Pt aware Rx ready for pick up 
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
## 2011-03-03 ENCOUNTER — Other Ambulatory Visit: Payer: Self-pay | Admitting: Family Medicine

## 2011-03-12 ENCOUNTER — Other Ambulatory Visit: Payer: Self-pay | Admitting: *Deleted

## 2011-03-12 MED ORDER — METHYLPHENIDATE HCL 10 MG PO TABS: 10.0000 mg | ORAL_TABLET | Freq: Three times a day (TID) | ORAL | Status: DC

## 2011-03-12 NOTE — Telephone Encounter (Signed)
Last OV 08-18-10, last filled 02-09-11 #90, pt aware Rx ready for pick-up.

## 2011-04-03 ENCOUNTER — Encounter: Payer: Self-pay | Admitting: Family Medicine

## 2011-04-05 ENCOUNTER — Ambulatory Visit (INDEPENDENT_AMBULATORY_CARE_PROVIDER_SITE_OTHER): Payer: 59 | Admitting: Family Medicine

## 2011-04-05 ENCOUNTER — Encounter: Payer: Self-pay | Admitting: Family Medicine

## 2011-04-05 VITALS — BP 112/68 | HR 75 | Temp 98.8°F | Wt 159.2 lb

## 2011-04-05 DIAGNOSIS — F988 Other specified behavioral and emotional disorders with onset usually occurring in childhood and adolescence: Secondary | ICD-10-CM

## 2011-04-05 MED ORDER — METHYLPHENIDATE HCL 10 MG PO TABS: 10.0000 mg | ORAL_TABLET | Freq: Every day | ORAL | Status: DC

## 2011-04-05 MED ORDER — METHYLPHENIDATE HCL 10 MG PO TABS: 10.0000 mg | ORAL_TABLET | Freq: Three times a day (TID) | ORAL | Status: DC

## 2011-04-05 NOTE — Assessment & Plan Note (Signed)
Refill ritalin rto 6 months or sooner prn 

## 2011-04-05 NOTE — Patient Instructions (Signed)
Attention Deficit-Hyperactivity Disorder ADHD Attention deficit-hyperactivity disorder (ADHD) is a problem with behavior issues based on the way the brain functions (neurobehavioral disorder). It is a common reason for behavior and academic problems in school. CAUSES The cause of ADHD is unknown in most cases. It may run in families. It sometimes can be associated with learning disabilities and other behavioral problems. SYMPTOMS There are three types of ADHD. Some of the symptoms include:  Inattentive   Gets bored or distracted easily   Loses or forgets things. Forgets to hand in homework.   Has trouble organizing or completing tasks.   Difficulty staying on task.   An inability to organize daily tasks and school work.   Leaving projects, chores and homework unfinished.   Trouble paying attention or responding to details. Careless mistakes.   Difficulty following directions. Often seems like is not listening.   Dislikes activities that require sustained attention (like chores or homework).   Hyperactive-impulsive   Feels like it is impossible to sit still or stay in a seat. Fidgeting with hands and feet.   Trouble waiting turn.   Talking too much or out of turn. Interruptive.   Speaks or acts impulsively   Aggressive, disruptive behavior   Constantly busy or on the go, noisy.   Combined   Has symptoms of both of the above.  Often children with ADHD feel discouraged about themselves and with school. They often perform well below their abilities in school. These symptoms can cause problems in home, school, and in relationships with peers. As children get older, the excess motor activities can calm down, but the problems with paying attention and staying organized persist. Most children do not outgrow ADHD but with good treatment can learn to cope with the symptoms. DIAGNOSIS When ADHD is suspected, the diagnosis should be made by professionals trained in ADHD.    Diagnosis will include:  Ruling out other reasons for the child's behavior.   The caregivers will check with the child's school and check their medical records.   They will talk to teachers and parents.   Behavior rating scales for the child will be filled out by those dealing with the child on a daily basis.  A diagnosis is made only after all information has been considered. TREATMENT Treatment usually includes behavioral treatment often along with medicines. It may include stimulant medicines. The stimulant medicines decrease impulsivity and hyperactivity and increase attention. Other medicines used include antidepressants and certain blood pressure medicines. Most experts agree that treatment for ADHD should address all aspects of the child's functioning. Treatment should not be limited to the use of medicines alone. Treatment should include structured classroom management. The parents must receive education to address rewarding good behavior, discipline and limit-setting. Tutoring and/or behavioral therapy should be available for the child. If untreated, the disorder can have long term serious effects into adolescence and adulthood. HOMECARE INSTRUCTIONS   Often with ADHD there is a lot of frustration among the family in dealing with the illness. There is often blame and anger that is not warranted. This is a life long illness. There is no way to prevent ADHD. In many cases, because the problem affects the family as a whole, the entire family may need help. A therapist can help the family find better ways to handle the disruptive behaviors and promote change. If the child is young, most of the therapist's work is with the parents. Parents will learn techniques for coping with and improving their child's behavior.   Sometimes only the child with the ADHD needs counseling. Your caregivers can help you make these decisions.   Children with ADHD may need help in organizing. Here are some helpful  tips:   Keep routines the same every day from wake-up time to bedtime. Schedule everything. This includes homework and playtime. This should include outdoor and indoor recreation. Keep the schedule on the refrigerator or a bulletin board where it is frequently seen. Mark schedule changes as far in advance as possible.   Have a place for everything and keep everything in its place. This includes clothing, backpacks, and school supplies.   Encourage writing down assignments and bringing home needed books.   Offer your child a well-balanced diet. Breakfast is especially important for school performance. Children should avoid drinks with caffeine including:   Soft drinks.   Coffee.   Tea.   However, some older children (adolescents) may find these drinks helpful in improving their attention.   Children with ADHD need consistent rules that they can understand and follow. If rules are followed, give small rewards. Children with ADHD often receive, and expect, criticism. Look for good behavior and praise it. Set realistic goals. Give clear instructions. Look for activities that can foster success and self-esteem. Make time for pleasant activities with your child. Give lots of affection.   Parents are their children's greatest advocates. Learn as much as possible about ADHD. This helps you become a stronger and better advocate for your child. It also helps you educate your child's teachers and instructors if they feel inadequate in these areas. Parent support groups are often helpful. A national group with local chapters is called CHADD (Children and Adults with Attention Deficit/Hyperactivity Disorder).  PROGNOSIS  There is no cure for ADHD. Children with the disorder seldom outgrow it. Many find adaptive ways to accommodate the ADHD as they mature. SEEK MEDICAL CARE IF YOUR CHILD HAS:  Repeated muscle twitches, cough or speech outbursts.   Sleep problems.   Marked loss of appetite.    Depression.   New or worsening behavioral problems.   Dizziness.   Racing heart.   Stomach pains.   Headaches.  Document Released: 09/07/2002 Document Re-Released: 06/26/2008 [employer] Patient Information 2011 [employer]. 

## 2011-04-05 NOTE — Progress Notes (Signed)
  Subjective:    Patient ID: Shelby Warren, female    DOB: Jan 04, 1966, 45 y.o.   MRN: [id number]  HPI Pt here to f/u ADD.  No complaints.  Pt doing well with dose of ritalin.    Review of Systems    as above Objective:   Physical Exam  Constitutional: She is oriented to person, place, and time. She appears well-developed and well-nourished.  Cardiovascular: Normal rate and regular rhythm.   No murmur heard. Pulmonary/Chest: Effort normal and breath sounds normal.  Neurological: She is alert and oriented to person, place, and time.  Psychiatric: She has a normal mood and affect. Her behavior is normal. Judgment and thought content normal.          Assessment & Plan:

## 2011-04-16 ENCOUNTER — Telehealth: Payer: Self-pay | Admitting: *Deleted

## 2011-04-16 DIAGNOSIS — F988 Other specified behavioral and emotional disorders with onset usually occurring in childhood and adolescence: Secondary | ICD-10-CM

## 2011-04-16 MED ORDER — METHYLPHENIDATE HCL 10 MG PO TABS: 10.0000 mg | ORAL_TABLET | Freq: Three times a day (TID) | ORAL | Status: DC

## 2011-04-16 NOTE — Telephone Encounter (Signed)
Pt called to report that she received 3 month supply of Rx but direction and quantity was incorrect. Pt advise to bring in Rx for August and July Rx will print the different to make month supply since Pt has already taking Rx to pharmacy. Pt aware Rx will be ready for pick up this afternoon.

## 2011-04-30 ENCOUNTER — Other Ambulatory Visit: Payer: Self-pay | Admitting: Family Medicine

## 2011-06-05 ENCOUNTER — Encounter: Payer: Self-pay | Admitting: Internal Medicine

## 2011-06-05 ENCOUNTER — Ambulatory Visit (INDEPENDENT_AMBULATORY_CARE_PROVIDER_SITE_OTHER): Payer: 59 | Admitting: Internal Medicine

## 2011-06-05 DIAGNOSIS — A491 Streptococcal infection, unspecified site: Secondary | ICD-10-CM

## 2011-06-05 DIAGNOSIS — J039 Acute tonsillitis, unspecified: Secondary | ICD-10-CM

## 2011-06-05 MED ORDER — CLINDAMYCIN HCL 300 MG PO CAPS: 300.0000 mg | ORAL_CAPSULE | Freq: Four times a day (QID) | ORAL | Status: AC

## 2011-06-05 NOTE — Progress Notes (Signed)
  Subjective:    Patient ID: Shelby Warren, female    DOB: 02/04/1966, 45 y.o.   MRN: [id number]  HPI Left sided sore throat for 3 days, worse yesterday. She has noted some white discharge in the left tonsil as well.  Past Medical History  Diagnosis Date  . Allergic rhinitis   . Hypothyroidism   . ADD (attention deficit disorder)   . Asthma      Review of Systems Has not had low-grade fever, yesterday was 100.2. Some chills and fatigue. No cough or chest congestion although has used her inhaler in the last couple of days. No sinus discharge or congestion.     Objective:   Physical Exam  Constitutional: She appears well-developed and well-nourished. No distress.  HENT:  Head: Normocephalic and atraumatic.  Right Ear: External ear normal.  Left Ear: External ear normal.       Both tonsils are normal in size, they are both slightly red, the left side has a few white patches. Uvula midline, throat walls  symmetric. No drooling.  Neck:       Symmetric submandibular lymph nodes, the left one is a slightly tender, no fluctuance   Cardiovascular: Normal rate, regular rhythm and normal heart sounds.   Pulmonary/Chest: Effort normal and breath sounds normal. No respiratory distress. She has no wheezes. She has no rales.  Skin: She is not diaphoretic.          Assessment & Plan:  Tonsillitis: Based on symptoms and physical findings the patient has tonsillitis. She is allergic to amoxicillin, does not recall ever taking Keflex. Will treat with clindamycin, if she is intolerant to it, will go for either Zithromax or Cefzil. See  Instructions.

## 2011-06-05 NOTE — Patient Instructions (Signed)
Rest, fluids , tylenol Take the antibiotic as prescribed: clindamycin Call if side effects Call if no better in few days Call anytime if the symptoms are severe, or if  Persistent white patches or tender glands

## 2011-06-08 ENCOUNTER — Telehealth: Payer: Self-pay | Admitting: *Deleted

## 2011-06-08 NOTE — Telephone Encounter (Signed)
Pt left VM that throat is better but the med Rx is now causing her nausea and now feels as if she is getting congestion in her chest. Pt seen on 06-05-11 and given clindamycin.   Left message to call office to clarify symptoms.

## 2011-06-11 NOTE — Telephone Encounter (Signed)
We can switch her from clindamycin to a Z-Pak, call a prescription

## 2011-06-11 NOTE — Telephone Encounter (Signed)
Discussed with patient and she stated she felt better and she did not need the ABX right now     KP

## 2011-07-11 ENCOUNTER — Other Ambulatory Visit: Payer: Self-pay

## 2011-07-11 DIAGNOSIS — F988 Other specified behavioral and emotional disorders with onset usually occurring in childhood and adolescence: Secondary | ICD-10-CM

## 2011-07-11 MED ORDER — METHYLPHENIDATE HCL 10 MG PO TABS: 10.0000 mg | ORAL_TABLET | Freq: Three times a day (TID) | ORAL | Status: DC

## 2011-07-11 NOTE — Telephone Encounter (Signed)
Patient called and request 3 mo supply of Ritalin.    Rx printed and left at check in patient aware to pick it up tomorrow   KP

## 2011-07-25 ENCOUNTER — Other Ambulatory Visit: Payer: Self-pay | Admitting: Family Medicine

## 2011-10-09 ENCOUNTER — Other Ambulatory Visit: Payer: Self-pay | Admitting: Family Medicine

## 2011-10-09 DIAGNOSIS — F988 Other specified behavioral and emotional disorders with onset usually occurring in childhood and adolescence: Secondary | ICD-10-CM

## 2011-10-09 MED ORDER — METHYLPHENIDATE HCL 10 MG PO TABS: 10.0000 mg | ORAL_TABLET | Freq: Three times a day (TID) | ORAL | Status: DC

## 2011-10-09 NOTE — Telephone Encounter (Signed)
Patient aware Rx ready for pick up.      KP 

## 2011-10-23 ENCOUNTER — Other Ambulatory Visit: Payer: Self-pay | Admitting: Family Medicine

## 2011-10-23 MED ORDER — OMEPRAZOLE 20 MG PO CPDR: 20.0000 mg | DELAYED_RELEASE_CAPSULE | Freq: Every day | ORAL | Status: DC

## 2011-10-23 NOTE — Telephone Encounter (Signed)
Faxed.   KP 

## 2011-10-25 ENCOUNTER — Ambulatory Visit (INDEPENDENT_AMBULATORY_CARE_PROVIDER_SITE_OTHER): Payer: 59 | Admitting: Family Medicine

## 2011-10-25 ENCOUNTER — Ambulatory Visit (HOSPITAL_BASED_OUTPATIENT_CLINIC_OR_DEPARTMENT_OTHER)
Admission: RE | Admit: 2011-10-25 | Discharge: 2011-10-25 | Disposition: A | Discharge status: Home / Self Care | Payer: 59 | Source: Ambulatory Visit | Attending: Family Medicine | Admitting: Family Medicine

## 2011-10-25 ENCOUNTER — Other Ambulatory Visit: Payer: Self-pay | Admitting: Family Medicine

## 2011-10-25 ENCOUNTER — Encounter: Payer: Self-pay | Admitting: Family Medicine

## 2011-10-25 VITALS — BP 124/82 | HR 97 | Temp 99.1°F | Wt 169.4 lb

## 2011-10-25 DIAGNOSIS — R109 Unspecified abdominal pain: Secondary | ICD-10-CM

## 2011-10-25 DIAGNOSIS — M549 Dorsalgia, unspecified: Secondary | ICD-10-CM

## 2011-10-25 DIAGNOSIS — R319 Hematuria, unspecified: Secondary | ICD-10-CM

## 2011-10-25 LAB — POCT URINALYSIS DIPSTICK
Bilirubin, UA: NEGATIVE
Glucose, UA: NEGATIVE
Leukocytes, UA: NEGATIVE
Protein, UA: NEGATIVE
Spec Grav, UA: 1.015
Urobilinogen, UA: 0.2
pH, UA: 6

## 2011-10-25 MED ORDER — CYCLOBENZAPRINE HCL 10 MG PO TABS: 10.0000 mg | ORAL_TABLET | Freq: Three times a day (TID) | ORAL | Status: AC | PRN

## 2011-10-25 MED ORDER — HYDROCODONE-ACETAMINOPHEN 7.5-750 MG PO TABS: 1.0000 | ORAL_TABLET | Freq: Three times a day (TID) | ORAL | Status: AC | PRN

## 2011-10-25 MED ORDER — CIPROFLOXACIN HCL 500 MG PO TABS: 500.0000 mg | ORAL_TABLET | Freq: Two times a day (BID) | ORAL | Status: AC

## 2011-10-25 NOTE — Progress Notes (Signed)
  Subjective:    Shelby Warren is a 46 y.o. female who presents for evaluation of low back pain. The patient has had no prior back problems. Symptoms have been present for 6 days and are gradually worsening.  Onset was related to / precipitated by no known injury. The pain is located in the across the lower back and radiates to the l groin. The pain is described as aching and sharp and occurs all day. She rates her pain as excruciating. Symptoms are exacerbated by nothing in particular. Symptoms are improved by nothing. She has also tried acetaminophen which provided no symptom relief. She has no other symptoms associated with the back pain. The patient has no "red flag" history indicative of complicated back pain.  The following portions of the patient's history were reviewed and updated as appropriate: allergies, current medications, past family history, past medical history, past social history, past surgical history and problem list.  Review of Systems Pertinent items are noted in HPI.    Objective:   Inspection and palpation: inspection of back is normal. Muscle tone and ROM exam: antalgic gait. Straight leg raise: positive at 45 degrees bilaterally. Neurological: normal DTRs, muscle strength and reflexes.   ua-- + blood Assessment:    Nonspecific acute low back pain ----? Renal colic   Plan:    Natural history and expected course discussed. Questions answered. Agricultural engineer distributed. CT scan of the affected area due to presence of hematuria--r/o stones.  cipro  vicodin Strain urine

## 2011-10-25 NOTE — Patient Instructions (Signed)

## 2011-10-26 ENCOUNTER — Telehealth: Payer: Self-pay | Admitting: *Deleted

## 2011-10-26 NOTE — Telephone Encounter (Signed)
Office Message from Date: 10/25/2011 12:00:00 AM Time of Call: [phone] [facility]axed To: [place] [facility] Fax Number: [phone] Facility: [facility] Patient: Shelby Warren, Shelby Warren DOB: 04-28-1966 Phone: [phone] Provider: Message: Jasmine December is calling with results for CT Scan without contrast ordered by Dr. Laury Axon. The results are no renal ureteral or bladder calculi, no hydronephrosis, no evidence of bowel obstruction, normal appendix, no CT findings to account for pt's abdominal pain and were read by Dr. Rito Ehrlich.

## 2011-10-27 LAB — URINE CULTURE: Colony Count: 25000

## 2011-11-08 ENCOUNTER — Other Ambulatory Visit: Payer: Self-pay | Admitting: *Deleted

## 2011-11-08 DIAGNOSIS — F988 Other specified behavioral and emotional disorders with onset usually occurring in childhood and adolescence: Secondary | ICD-10-CM

## 2011-11-08 MED ORDER — METHYLPHENIDATE HCL 10 MG PO TABS: 10.0000 mg | ORAL_TABLET | Freq: Three times a day (TID) | ORAL | Status: DC

## 2011-11-08 NOTE — Telephone Encounter (Signed)
Pt aware Rx ready for pick up 

## 2011-12-06 ENCOUNTER — Encounter: Payer: Self-pay | Admitting: Family Medicine

## 2011-12-06 ENCOUNTER — Ambulatory Visit (INDEPENDENT_AMBULATORY_CARE_PROVIDER_SITE_OTHER): Payer: 59 | Admitting: Family Medicine

## 2011-12-06 VITALS — BP 114/76 | HR 72 | Temp 98.7°F | Wt 170.4 lb

## 2011-12-06 DIAGNOSIS — E039 Hypothyroidism, unspecified: Secondary | ICD-10-CM

## 2011-12-06 DIAGNOSIS — F988 Other specified behavioral and emotional disorders with onset usually occurring in childhood and adolescence: Secondary | ICD-10-CM

## 2011-12-06 LAB — T3, FREE: T3, Free: 2.9 pg/mL (ref 2.3–4.2)

## 2011-12-06 LAB — TSH: TSH: 1.33 u[IU]/mL (ref 0.35–5.50)

## 2011-12-06 LAB — T4, FREE: Free T4: 1.34 ng/dL (ref 0.60–1.60)

## 2011-12-06 MED ORDER — METHYLPHENIDATE HCL 10 MG PO TABS: ORAL_TABLET | ORAL | Status: DC

## 2011-12-06 MED ORDER — METHYLPHENIDATE HCL 10 MG PO TABS: 10.0000 mg | ORAL_TABLET | Freq: Three times a day (TID) | ORAL | Status: DC

## 2011-12-06 NOTE — Assessment & Plan Note (Signed)
Controlled  Refill meds for 3 months

## 2011-12-06 NOTE — Assessment & Plan Note (Signed)
Check labs con't synthroid 

## 2011-12-06 NOTE — Patient Instructions (Signed)
Attention Deficit Hyperactivity Disorder Attention deficit hyperactivity disorder (ADHD) is a problem with behavior issues based on the way the brain functions (neurobehavioral disorder). It is a common reason for behavior and academic problems in school. CAUSES  The cause of ADHD is unknown in most cases. It may run in families. It sometimes can be associated with learning disabilities and other behavioral problems. SYMPTOMS  There are 3 types of ADHD. The 3 types and some of the symptoms include:  Inattentive   Gets bored or distracted easily.   Loses or forgets things. Forgets to hand in homework.   Has trouble organizing or completing tasks.   Difficulty staying on task.   An inability to organize daily tasks and school work.   Leaving projects, chores, or homework unfinished.   Trouble paying attention or responding to details. Careless mistakes.   Difficulty following directions. Often seems like is not listening.   Dislikes activities that require sustained attention (like chores or homework).   Hyperactive-impulsive   Feels like it is impossible to sit still or stay in a seat. Fidgeting with hands and feet.   Trouble waiting turn.   Talking too much or out of turn. Interruptive.   Speaks or acts impulsively.   Aggressive, disruptive behavior.   Constantly busy or on the go, noisy.   Combined   Has symptoms of both of the above.  Often children with ADHD feel discouraged about themselves and with school. They often perform well below their abilities in school. These symptoms can cause problems in home, school, and in relationships with peers. As children get older, the excess motor activities can calm down, but the problems with paying attention and staying organized persist. Most children do not outgrow ADHD but with good treatment can learn to cope with the symptoms. DIAGNOSIS  When ADHD is suspected, the diagnosis should be made by professionals trained in  ADHD.  Diagnosis will include:  Ruling out other reasons for the child's behavior.   The caregivers will check with the child's school and check their medical records.   They will talk to teachers and parents.   Behavior rating scales for the child will be filled out by those dealing with the child on a daily basis.  A diagnosis is made only after all information has been considered. TREATMENT  Treatment usually includes behavioral treatment often along with medicines. It may include stimulant medicines. The stimulant medicines decrease impulsivity and hyperactivity and increase attention. Other medicines used include antidepressants and certain blood pressure medicines. Most experts agree that treatment for ADHD should address all aspects of the child's functioning. Treatment should not be limited to the use of medicines alone. Treatment should include structured classroom management. The parents must receive education to address rewarding good behavior, discipline, and limit-setting. Tutoring or behavioral therapy or both should be available for the child. If untreated, the disorder can have long-term serious effects into adolescence and adulthood. HOME CARE INSTRUCTIONS   Often with ADHD there is a lot of frustration among the family in dealing with the illness. There is often blame and anger that is not warranted. This is a life long illness. There is no way to prevent ADHD. In many cases, because the problem affects the family as a whole, the entire family may need help. A therapist can help the family find better ways to handle the disruptive behaviors and promote change. If the child is young, most of the therapist's work is with the parents. Parents will   learn techniques for coping with and improving their child's behavior. Sometimes only the child with the ADHD needs counseling. Your caregivers can help you make these decisions.   Children with ADHD may need help in organizing. Some  helpful tips include:   Keep routines the same every day from wake-up time to bedtime. Schedule everything. This includes homework and playtime. This should include outdoor and indoor recreation. Keep the schedule on the refrigerator or a bulletin board where it is frequently seen. Mark schedule changes as far in advance as possible.   Have a place for everything and keep everything in its place. This includes clothing, backpacks, and school supplies.   Encourage writing down assignments and bringing home needed books.   Offer your child a well-balanced diet. Breakfast is especially important for school performance. Children should avoid drinks with caffeine including:   Soft drinks.   Coffee.   Tea.   However, some older children (adolescents) may find these drinks helpful in improving their attention.   Children with ADHD need consistent rules that they can understand and follow. If rules are followed, give small rewards. Children with ADHD often receive, and expect, criticism. Look for good behavior and praise it. Set realistic goals. Give clear instructions. Look for activities that can foster success and self-esteem. Make time for pleasant activities with your child. Give lots of affection.   Parents are their children's greatest advocates. Learn as much as possible about ADHD. This helps you become a stronger and better advocate for your child. It also helps you educate your child's teachers and instructors if they feel inadequate in these areas. Parent support groups are often helpful. A national group with local chapters is called CHADD (Children and Adults with Attention Deficit Hyperactivity Disorder).  PROGNOSIS  There is no cure for ADHD. Children with the disorder seldom outgrow it. Many find adaptive ways to accommodate the ADHD as they mature. SEEK MEDICAL CARE IF:  Your child has repeated muscle twitches, cough or speech outbursts.   Your child has sleep problems.   Your  child has a marked loss of appetite.   Your child develops depression.   Your child has new or worsening behavioral problems.   Your child develops dizziness.   Your child has a racing heart.   Your child has stomach pains.   Your child develops headaches.  Document Released: 09/07/2002 Document Revised: 09/06/2011 Document Reviewed: 04/19/2008 [employer] Patient Information 2012 [employer]. 

## 2011-12-06 NOTE — Progress Notes (Signed)
  Subjective:    Patient ID: Shelby Warren, female    DOB: 02-17-1966, 46 y.o.   MRN: [id number]  HPI Pt here for f/u  ADD and check labs for thyroid----no complaints.  Review of Systems As above    Objective:   Physical Exam  Constitutional: She is oriented to person, place, and time. She appears well-developed and well-nourished.  Cardiovascular: Normal rate, regular rhythm and intact distal pulses.   No murmur heard. Pulmonary/Chest: Effort normal and breath sounds normal. No respiratory distress. She has no wheezes. She has no rales. She exhibits no tenderness.  Neurological: She is alert and oriented to person, place, and time.  Psychiatric: She has a normal mood and affect. Her behavior is normal. Judgment and thought content normal.          Assessment & Plan:

## 2011-12-12 ENCOUNTER — Other Ambulatory Visit: Payer: Self-pay | Admitting: Family Medicine

## 2012-01-27 ENCOUNTER — Other Ambulatory Visit: Payer: Self-pay | Admitting: Family Medicine

## 2012-02-21 ENCOUNTER — Telehealth: Payer: Self-pay | Admitting: Family Medicine

## 2012-02-21 MED ORDER — LEVOTHYROXINE SODIUM 125 MCG PO TABS: ORAL_TABLET | ORAL | Status: DC

## 2012-02-21 NOTE — Telephone Encounter (Signed)
Rx sent 

## 2012-02-21 NOTE — Telephone Encounter (Signed)
Refill: Levothyroxine tablet. Take 1 tablet by mouth every day. Qty 30. Last fill 01-23-12

## 2012-03-06 ENCOUNTER — Other Ambulatory Visit: Payer: Self-pay

## 2012-03-06 DIAGNOSIS — F988 Other specified behavioral and emotional disorders with onset usually occurring in childhood and adolescence: Secondary | ICD-10-CM

## 2012-03-06 MED ORDER — METHYLPHENIDATE HCL 10 MG PO TABS: ORAL_TABLET | ORAL | Status: DC

## 2012-03-06 MED ORDER — METHYLPHENIDATE HCL 10 MG PO TABS: 10.0000 mg | ORAL_TABLET | Freq: Three times a day (TID) | ORAL | Status: DC

## 2012-03-06 NOTE — Telephone Encounter (Signed)
Patient aware Rx will be ready for pick up tomorrow.      KP 

## 2012-04-02 ENCOUNTER — Telehealth: Payer: Self-pay | Admitting: *Deleted

## 2012-04-02 NOTE — Telephone Encounter (Signed)
No we can not with controlled substance

## 2012-04-02 NOTE — Telephone Encounter (Signed)
Patient aware and she voiced understanding.     KP 

## 2012-04-02 NOTE — Telephone Encounter (Signed)
Pt states that she lost Rx for July and august. Pt notes that she filled the one for June but is unable to find other Rx. Pt would like to know if these Rx can be replaced. Pt given 3 month supply on 03-06-12, last OV 12-06-11 .Please advise

## 2012-04-04 ENCOUNTER — Telehealth: Payer: Self-pay | Admitting: Family Medicine

## 2012-04-04 DIAGNOSIS — F988 Other specified behavioral and emotional disorders with onset usually occurring in childhood and adolescence: Secondary | ICD-10-CM

## 2012-04-04 NOTE — Telephone Encounter (Signed)
Patient understands this was addressed already, however she stated she went to the pharmacy and they stated there should be no reason we could not void the lost rx for July and give her a new one. She is requesting to speak with Dr. Laury Axon only if possible. I advised patient nothing could happen today it would be Monday afternoon before she would get a return call back patient call back # [phone]

## 2012-04-07 MED ORDER — METHYLPHENIDATE HCL 10 MG PO TABS: 10.0000 mg | ORAL_TABLET | Freq: Three times a day (TID) | ORAL | Status: DC

## 2012-04-07 NOTE — Telephone Encounter (Signed)
Patient Rx will be ready for pick up tomorrow and she voiced understanding.    KP

## 2012-04-07 NOTE — Telephone Encounter (Signed)
We will print 1 month this time and go back to 1 a month unless she can do mail order.   I am aware I CAN print a new rx but it is office policy not to.   We will do it this once. We can void old rx but I was not aware that that meant it could not be filled if found by someone else.

## 2012-04-07 NOTE — Telephone Encounter (Signed)
msg left to call the office     KP 

## 2012-04-25 ENCOUNTER — Telehealth: Payer: Self-pay | Admitting: Family Medicine

## 2012-04-25 MED ORDER — OMEPRAZOLE 20 MG PO CPDR: DELAYED_RELEASE_CAPSULE | ORAL | Status: DC

## 2012-04-25 NOTE — Telephone Encounter (Signed)
Rx sent 

## 2012-04-25 NOTE — Telephone Encounter (Signed)
Refill: Omeprazole dr 20mg  capsule. Take one capsule by mouth every day. Qty 30. Last fill 03-27-12

## 2012-05-12 ENCOUNTER — Other Ambulatory Visit: Payer: Self-pay

## 2012-05-12 DIAGNOSIS — F988 Other specified behavioral and emotional disorders with onset usually occurring in childhood and adolescence: Secondary | ICD-10-CM

## 2012-05-12 MED ORDER — METHYLPHENIDATE HCL 10 MG PO TABS: ORAL_TABLET | ORAL | Status: DC

## 2012-05-12 NOTE — Telephone Encounter (Signed)
VM left advising patient Rx ready for pick up.     KP 

## 2012-06-12 ENCOUNTER — Other Ambulatory Visit: Payer: Self-pay | Admitting: *Deleted

## 2012-06-12 DIAGNOSIS — F988 Other specified behavioral and emotional disorders with onset usually occurring in childhood and adolescence: Secondary | ICD-10-CM

## 2012-06-12 MED ORDER — METHYLPHENIDATE HCL 10 MG PO TABS: 10.0000 mg | ORAL_TABLET | Freq: Three times a day (TID) | ORAL | Status: DC

## 2012-06-12 NOTE — Telephone Encounter (Signed)
Patient aware Rx ready for pick up.      KP 

## 2012-06-12 NOTE — Telephone Encounter (Signed)
Pt called to request refill on her ritalin 10mg  TID per will run out tomorrow, last OV 12-06-11 for ADD, last refill 05-12-12 #90 no refills

## 2012-06-12 NOTE — Telephone Encounter (Signed)
Refill x1---due for ov 

## 2012-06-21 ENCOUNTER — Other Ambulatory Visit: Payer: Self-pay | Admitting: Family Medicine

## 2012-06-23 NOTE — Telephone Encounter (Signed)
Will need labs prior to more refills.

## 2012-07-04 ENCOUNTER — Ambulatory Visit (INDEPENDENT_AMBULATORY_CARE_PROVIDER_SITE_OTHER): Payer: BC Managed Care – PPO | Admitting: Family Medicine

## 2012-07-04 ENCOUNTER — Encounter: Payer: Self-pay | Admitting: Family Medicine

## 2012-07-04 VITALS — BP 110/74 | HR 93 | Temp 97.9°F | Wt 166.8 lb

## 2012-07-04 DIAGNOSIS — Z9109 Other allergy status, other than to drugs and biological substances: Secondary | ICD-10-CM

## 2012-07-04 DIAGNOSIS — R079 Chest pain, unspecified: Secondary | ICD-10-CM

## 2012-07-04 DIAGNOSIS — F438 Other reactions to severe stress: Secondary | ICD-10-CM

## 2012-07-04 DIAGNOSIS — K3189 Other diseases of stomach and duodenum: Secondary | ICD-10-CM

## 2012-07-04 DIAGNOSIS — R1013 Epigastric pain: Secondary | ICD-10-CM

## 2012-07-04 DIAGNOSIS — R82998 Other abnormal findings in urine: Secondary | ICD-10-CM

## 2012-07-04 DIAGNOSIS — J309 Allergic rhinitis, unspecified: Secondary | ICD-10-CM

## 2012-07-04 DIAGNOSIS — F430 Acute stress reaction: Secondary | ICD-10-CM

## 2012-07-04 DIAGNOSIS — L509 Urticaria, unspecified: Secondary | ICD-10-CM

## 2012-07-04 DIAGNOSIS — E039 Hypothyroidism, unspecified: Secondary | ICD-10-CM

## 2012-07-04 LAB — HEPATIC FUNCTION PANEL
ALT: 15 U/L (ref 0–35)
AST: 17 U/L (ref 0–37)
Alkaline Phosphatase: 26 U/L — ABNORMAL LOW (ref 39–117)
Bilirubin, Direct: 0.1 mg/dL (ref 0.0–0.3)
Total Bilirubin: 1 mg/dL (ref 0.3–1.2)

## 2012-07-04 LAB — POCT URINALYSIS DIPSTICK
Bilirubin, UA: NEGATIVE
Glucose, UA: NEGATIVE
Ketones, UA: NEGATIVE
Nitrite, UA: NEGATIVE

## 2012-07-04 LAB — CBC WITH DIFFERENTIAL/PLATELET
Eosinophils Relative: 2.5 % (ref 0.0–5.0)
HCT: 40.9 % (ref 36.0–46.0)
Lymphs Abs: 1.6 10*3/uL (ref 0.7–4.0)
Monocytes Relative: 8.5 % (ref 3.0–12.0)
Platelets: 247 10*3/uL (ref 150.0–400.0)
WBC: 4.3 10*3/uL — ABNORMAL LOW (ref 4.5–10.5)

## 2012-07-04 LAB — BASIC METABOLIC PANEL
BUN: 13 mg/dL (ref 6–23)
GFR: 84.68 mL/min (ref >60.00)
Potassium: 3.8 mEq/L (ref 3.5–5.1)
Sodium: 137 mEq/L (ref 135–145)

## 2012-07-04 LAB — LIPID PANEL
Cholesterol: 200 mg/dL (ref 0–200)
LDL Cholesterol: 123 mg/dL — ABNORMAL HIGH (ref 0–99)
Total CHOL/HDL Ratio: 3

## 2012-07-04 LAB — H. PYLORI ANTIBODY, IGG: H Pylori IgG: NEGATIVE

## 2012-07-04 MED ORDER — LEVOCETIRIZINE DIHYDROCHLORIDE 5 MG PO TABS: 5.0000 mg | ORAL_TABLET | Freq: Every evening | ORAL | Status: DC

## 2012-07-04 MED ORDER — ESOMEPRAZOLE MAGNESIUM 40 MG PO CPDR: 40.0000 mg | DELAYED_RELEASE_CAPSULE | Freq: Every day | ORAL | Status: DC

## 2012-07-04 MED ORDER — LEVOTHYROXINE SODIUM 125 MCG PO TABS: ORAL_TABLET | ORAL | Status: DC

## 2012-07-04 NOTE — Progress Notes (Signed)
  Subjective:    Shelby Warren is a 46 y.o. female who presents for evaluation of chest pain. Onset was several  days ago. Symptoms have been unchanged since that time. The patient describes the pain as burning, sharp and does not radiate. Patient rates pain as a 5/10 in intensity. Associated symptoms are: chest pain and increased pain with stress. Aggravating factors are: emotional stress and large meals. Alleviating factors are: none. Patient's cardiac risk factors are: sedentary lifestyle. Patient's risk factors for DVT/PE: none. Previous cardiac testing: none.  The following portions of the patient's history were reviewed and updated as appropriate: allergies, current medications, past family history, past medical history, past social history, past surgical history and problem list.  Review of Systems Pertinent items are noted in HPI.    Objective:    BP 110/74  Pulse 93  Temp 97.9 F (36.6 C) (Oral)  Wt 166 lb 12.8 oz (75.66 kg)  SpO2 98% General appearance: alert, cooperative, appears stated age and no distress Throat: lips, mucosa, and tongue normal; teeth and gums normal Neck: no adenopathy, supple, symmetrical, trachea midline and thyroid not enlarged, symmetric, no tenderness/mass/nodules Lungs: clear to auscultation bilaterally Heart: S1, S2 normal Extremities: extremities normal, atraumatic, no cyanosis or edema  Cardiographics ECG: normal sinus rhythm, no blocks or conduction defects, no ischemic changes  Imaging Chest x-ray: not indicated    Assessment:    Chest pain, suspected etiology: GERD and possible stricture   hypothyroidism---  Check labs Stress reaction--- consider counseling if things don't settle down Plan:    Patient history and exam consistent with non-cardiac cause of chest pain. Worsening signs and symptoms discussed and patient verbalized understanding. d/c omeprazole -- start nexium -- if no improvement f/u GI

## 2012-07-04 NOTE — Patient Instructions (Addendum)
Diet for Gastroesophageal Reflux Disease, Adult  Reflux (acid reflux) is when acid from your stomach flows up into the esophagus. When acid comes in contact with the esophagus, the acid causes irritation and soreness (inflammation) in the esophagus. When reflux happens often or so severely that it causes damage to the esophagus, it is called gastroesophageal reflux disease (GERD). Nutrition therapy can help ease the discomfort of GERD.  FOODS OR DRINKS TO AVOID OR LIMIT   Smoking or chewing tobacco. Nicotine is one of the most potent stimulants to acid production in the gastrointestinal tract.   Caffeinated and decaffeinated coffee and black tea.   Regular or low-calorie carbonated beverages or energy drinks (caffeine-free carbonated beverages are allowed).    Strong spices, such as black pepper, white pepper, red pepper, cayenne, curry powder, and chili powder.   Peppermint or spearmint.   Chocolate.   High-fat foods, including meats and fried foods. Extra added fats including oils, butter, salad dressings, and nuts. Limit these to less than 8 tsp per day.   Fruits and vegetables if they are not tolerated, such as citrus fruits or tomatoes.   Alcohol.   Any food that seems to aggravate your condition.  If you have questions regarding your diet, call your caregiver or a registered dietitian.  OTHER THINGS THAT MAY HELP GERD INCLUDE:    Eating your meals slowly, in a relaxed setting.   Eating 5 to 6 small meals per day instead of 3 large meals.   Eliminating food for a period of time if it causes distress.   Not lying down until 3 hours after eating a meal.   Keeping the head of your bed raised 6 to 9 inches (15 to 23 cm) by using a foam wedge or blocks under the legs of the bed. Lying flat may make symptoms worse.   Being physically active. Weight loss may be helpful in reducing reflux in overweight or obese adults.   Wear loose fitting clothing  EXAMPLE MEAL PLAN  This meal plan is approximately  2,000 calories based on ChooseMyPlate.gov meal planning guidelines.  Breakfast    cup cooked oatmeal.   1 cup strawberries.   1 cup low-fat milk.   1 oz almonds.  Snack   1 cup cucumber slices.   6 oz yogurt (made from low-fat or fat-free milk).  Lunch   2 slice whole-wheat bread.   2 oz sliced turkey.   2 tsp mayonnaise.   1 cup blueberries.   1 cup snap peas.  Snack   6 whole-wheat crackers.   1 oz string cheese.  Dinner    cup brown rice.   1 cup mixed veggies.   1 tsp olive oil.   3 oz grilled fish.  Document Released: 09/17/2005 Document Revised: 12/10/2011 Document Reviewed: 08/03/2011  Exit[employer] Patient Information 2013 [employer].

## 2012-07-06 LAB — URINE CULTURE: Colony Count: >=100000

## 2012-07-10 ENCOUNTER — Other Ambulatory Visit: Payer: Self-pay | Admitting: Family Medicine

## 2012-07-10 DIAGNOSIS — F988 Other specified behavioral and emotional disorders with onset usually occurring in childhood and adolescence: Secondary | ICD-10-CM

## 2012-07-10 MED ORDER — METHYLPHENIDATE HCL 10 MG PO TABS: 10.0000 mg | ORAL_TABLET | Freq: Three times a day (TID) | ORAL | Status: DC

## 2012-07-11 ENCOUNTER — Ambulatory Visit (INDEPENDENT_AMBULATORY_CARE_PROVIDER_SITE_OTHER): Payer: BC Managed Care – PPO | Admitting: *Deleted

## 2012-07-11 DIAGNOSIS — E538 Deficiency of other specified B group vitamins: Secondary | ICD-10-CM

## 2012-07-11 MED ORDER — CYANOCOBALAMIN 1000 MCG/ML IJ SOLN
1000.0000 ug | Freq: Once | INTRAMUSCULAR | Status: AC
  Administered 2012-07-11: 1000 ug via INTRAMUSCULAR

## 2012-07-16 ENCOUNTER — Telehealth: Payer: Self-pay

## 2012-07-16 NOTE — Telephone Encounter (Signed)
Pt called in and left message on triage line stating pt of Lowne, OV 07/04/12, lab results show needed B12 injections. Pt also states last night hives on face and torso, hives mostly come out at night. Pt states Zyrtec not helping and all this is affecting pt asthma. Pt asked what should she do or is there another med she can get for this problem. Called left a message letting pt know I received her message and would call her back as soon as I get info. Plz advise   MW

## 2012-07-16 NOTE — Telephone Encounter (Signed)
1. Her B12 was  low, her PCP recommended to start injections 2.As far as hives if she is taking a new medication she needs to stop it. Continue with Zyrtec Start over-the-counter ranitidine 75 mg 2 tablets daily If symptoms become severe, lip- tongue swelling---> go to the ER Needs office visit for assessment of hives .

## 2012-07-16 NOTE — Telephone Encounter (Signed)
Pt states she feels funny  Her heart has been racing all day. Pt states she has been taking Nexium, do you still advise to take Ranitidine? Pt states how can she get hives assessed when they come bad at night and go in AM? Plz advise     MW

## 2012-07-17 ENCOUNTER — Encounter: Payer: Self-pay | Admitting: Family Medicine

## 2012-07-17 ENCOUNTER — Ambulatory Visit (INDEPENDENT_AMBULATORY_CARE_PROVIDER_SITE_OTHER): Payer: BC Managed Care – PPO | Admitting: Family Medicine

## 2012-07-17 VITALS — BP 117/74 | HR 85 | Temp 97.8°F | Ht 67.0 in | Wt 170.4 lb

## 2012-07-17 DIAGNOSIS — F411 Generalized anxiety disorder: Secondary | ICD-10-CM

## 2012-07-17 DIAGNOSIS — F419 Anxiety disorder, unspecified: Secondary | ICD-10-CM

## 2012-07-17 DIAGNOSIS — E538 Deficiency of other specified B group vitamins: Secondary | ICD-10-CM

## 2012-07-17 DIAGNOSIS — L509 Urticaria, unspecified: Secondary | ICD-10-CM

## 2012-07-17 MED ORDER — CLONAZEPAM 0.5 MG PO TABS
0.5000 mg | ORAL_TABLET | Freq: Two times a day (BID) | ORAL | Status: DC | PRN
Start: 2012-07-17 — End: 2012-09-02

## 2012-07-17 MED ORDER — MONTELUKAST SODIUM 10 MG PO TABS: 10.0000 mg | ORAL_TABLET | Freq: Every day | ORAL | Status: DC

## 2012-07-17 MED ORDER — CYANOCOBALAMIN 1000 MCG/ML IJ SOLN
1000.0000 ug | Freq: Once | INTRAMUSCULAR | Status: AC
  Administered 2012-07-17: 1000 ug via INTRAMUSCULAR

## 2012-07-17 NOTE — Telephone Encounter (Signed)
Discussed with pt. She is coming in today to see Dr. Beverely Low @ 230p.

## 2012-07-17 NOTE — Assessment & Plan Note (Signed)
New.  Suspect this is contributing to pt's nightly hives.  Start Klonopin prn for sxs relief.  Will need close PCP f/u.

## 2012-07-17 NOTE — Progress Notes (Signed)
  Subjective:    Patient ID: Shelby Warren, female    DOB: 1966/02/04, 46 y.o.   MRN: [id number]  HPI Hives- told PCP that she was breaking out in hives at visit on 10/4. Was switched to Nexium which improved GERD.  Started B12 injxns 6 days ago.  Hives typically appear at night and improve w/ Zyrtec.  Last night hives were on lower back, buttocks, legs.  Having worsening asthma sxs- requiring increased inhaler use.  Pt admits to high stress level.  No hives during the day.  No change in soaps/detergents.  Has not had allergy testing for 'years'.    Review of Systems For ROS see HPI     Objective:   Physical Exam  Vitals reviewed. Constitutional: She appears well-developed and well-nourished. No distress.  Skin: Skin is warm and dry. No rash (no hives or urticarial lesions seen) noted. No erythema.  Psychiatric:       anxious          Assessment & Plan:

## 2012-07-17 NOTE — Assessment & Plan Note (Signed)
New to provider.  Get 2nd shot today.

## 2012-07-17 NOTE — Patient Instructions (Addendum)
Follow up in 1 month to recheck hives/anxiety Start the Singulair daily to control asthma and allergy Continue Zyrtec daily Use the Klonopin as needed for anxiety We'll notify you of your lab results Call with any questions or concerns Hang in there!!

## 2012-07-17 NOTE — Telephone Encounter (Signed)
Needs to be seen---->  UC? At our office in [place]? Unfortunately, my schedule is full

## 2012-07-17 NOTE — Assessment & Plan Note (Signed)
New.  Since these are occuring nightly suspect this is either contact, food trigger, or stress.  Check allergy panel.  Add Singulair to pt's allergy/asthma regimen.  Reviewed supportive care and red flags that should prompt return.  Pt expressed understanding and is in agreement w/ plan.

## 2012-07-18 LAB — ~~LOC~~ ALLERGY PANEL
Bahia Grass: <0.1 kU/L
Bermuda Grass: <0.1 kU/L
Box Elder IgE: <0.1 kU/L
Cockroach: <0.1 kU/L
Dog Dander: 1.35 kU/L — ABNORMAL HIGH
Elm IgE: <0.1 kU/L
Johnson Grass: <0.1 kU/L
Mugwort: <0.1 kU/L
Nettle: <0.1 kU/L
Oak: <0.1 kU/L
Rough Pigweed  IgE: <0.1 kU/L
Sheep Sorrel IgE: <0.1 kU/L
Stemphylium Botryosum: <0.1 kU/L

## 2012-07-21 ENCOUNTER — Ambulatory Visit: Payer: BC Managed Care – PPO

## 2012-07-22 ENCOUNTER — Encounter: Payer: Self-pay | Admitting: Family Medicine

## 2012-07-22 DIAGNOSIS — Z91011 Allergy to milk products: Secondary | ICD-10-CM

## 2012-07-22 MED ORDER — PREDNISONE 10 MG PO TABS: ORAL_TABLET | ORAL | Status: DC

## 2012-07-22 NOTE — Telephone Encounter (Signed)
Please advise 

## 2012-07-22 NOTE — Telephone Encounter (Addendum)
Please advise prednisone dosage and note pt response please Noted MD Tabori directions in MY chart message Prednisone 10 mg- 3 x3 days, 2 x3 days, 1 x3 days, #18 Sent both referral and new RX to pharmacy, pt made aware via my chart message

## 2012-07-28 ENCOUNTER — Ambulatory Visit (INDEPENDENT_AMBULATORY_CARE_PROVIDER_SITE_OTHER): Payer: BC Managed Care – PPO

## 2012-07-28 DIAGNOSIS — E538 Deficiency of other specified B group vitamins: Secondary | ICD-10-CM

## 2012-07-28 MED ORDER — CYANOCOBALAMIN 1000 MCG/ML IJ SOLN
1000.0000 ug | Freq: Once | INTRAMUSCULAR | Status: AC
  Administered 2012-07-28: 1000 ug via INTRAMUSCULAR

## 2012-08-04 ENCOUNTER — Ambulatory Visit (INDEPENDENT_AMBULATORY_CARE_PROVIDER_SITE_OTHER): Payer: BC Managed Care – PPO

## 2012-08-04 DIAGNOSIS — E538 Deficiency of other specified B group vitamins: Secondary | ICD-10-CM

## 2012-08-04 MED ORDER — CYANOCOBALAMIN 1000 MCG/ML IJ SOLN
1000.0000 ug | Freq: Once | INTRAMUSCULAR | Status: AC
  Administered 2012-08-04: 1000 ug via INTRAMUSCULAR

## 2012-08-06 LAB — IGG FOOD PANEL
Beef, IgG: 5.5 ug/mL — ABNORMAL HIGH (ref <2.0)
Corn, IgG: <0.15 ug/mL (ref <0.15)
Egg white, IgG: 2.3
Peanut, IgG: 0.17 ug/mL — ABNORMAL HIGH (ref <0.15)

## 2012-08-08 ENCOUNTER — Encounter: Payer: Self-pay | Admitting: Family Medicine

## 2012-08-08 ENCOUNTER — Ambulatory Visit (INDEPENDENT_AMBULATORY_CARE_PROVIDER_SITE_OTHER): Payer: BC Managed Care – PPO | Admitting: Family Medicine

## 2012-08-08 ENCOUNTER — Other Ambulatory Visit: Payer: BC Managed Care – PPO

## 2012-08-08 VITALS — BP 100/64 | HR 82 | Temp 98.2°F | Wt 165.8 lb

## 2012-08-08 DIAGNOSIS — L509 Urticaria, unspecified: Secondary | ICD-10-CM

## 2012-08-08 DIAGNOSIS — Z889 Allergy status to unspecified drugs, medicaments and biological substances status: Secondary | ICD-10-CM

## 2012-08-08 DIAGNOSIS — Z888 Allergy status to other drugs, medicaments and biological substances status: Secondary | ICD-10-CM

## 2012-08-08 DIAGNOSIS — J45909 Unspecified asthma, uncomplicated: Secondary | ICD-10-CM

## 2012-08-08 DIAGNOSIS — E538 Deficiency of other specified B group vitamins: Secondary | ICD-10-CM

## 2012-08-08 LAB — CBC WITH DIFFERENTIAL/PLATELET
Basophils Relative: 0 % (ref 0.0–3.0)
Eosinophils Absolute: 0.1 10*3/uL (ref 0.0–0.7)
Eosinophils Relative: 1.2 % (ref 0.0–5.0)
Lymphocytes Relative: 25.1 % (ref 12.0–46.0)
MCHC: 33.2 g/dL (ref 30.0–36.0)
MCV: 92.1 fl (ref 78.0–100.0)
Monocytes Absolute: 0.3 10*3/uL (ref 0.1–1.0)
Neutrophils Relative %: 67.1 % (ref 43.0–77.0)
Platelets: 257 10*3/uL (ref 150.0–400.0)
RBC: 4.27 Mil/uL (ref 3.87–5.11)
WBC: 5.1 10*3/uL (ref 4.5–10.5)

## 2012-08-08 NOTE — Assessment & Plan Note (Signed)
Recheck labs 

## 2012-08-08 NOTE — Assessment & Plan Note (Signed)
Pt states they con't to come and go-- she is unable to take 2 zyrtec, 2 allegra and up to 2 hydroxyzine tid ----  Per  Allergy.  It makes her too tired and she can not function She would like a second opinion Refer to [facility]-- Dr Maple Hudson

## 2012-08-08 NOTE — Progress Notes (Signed)
  Subjective:    Patient ID: Shelby Warren, female    DOB: 07-22-1966, 46 y.o.   MRN: [id number]  HPI Pt here c/o hives that con't despite all meds allergist wanted her to take.  Allergy test done there were neg---- but when she eats foods mentioned in allergy test done by Dr Beverely Low she breaks out in hives and she was on a vent as a child after eating fish.    Pt is asking for a second opinion.      Review of Systems As above    Objective:   Physical Exam BP 100/64  Pulse 82  Temp 98.2 F (36.8 C) (Oral)  Wt 165 lb 12.8 oz (75.206 kg)  SpO2 99%  LMP 07/13/2012 General appearance: alert, cooperative, appears stated age and no distress Skin: + hives R knee and R cheek       Assessment & Plan:

## 2012-08-08 NOTE — Patient Instructions (Addendum)

## 2012-08-09 ENCOUNTER — Other Ambulatory Visit: Payer: Self-pay | Admitting: Family Medicine

## 2012-08-09 DIAGNOSIS — E538 Deficiency of other specified B group vitamins: Secondary | ICD-10-CM

## 2012-08-12 ENCOUNTER — Other Ambulatory Visit: Payer: Self-pay

## 2012-08-12 DIAGNOSIS — F988 Other specified behavioral and emotional disorders with onset usually occurring in childhood and adolescence: Secondary | ICD-10-CM

## 2012-08-12 MED ORDER — METHYLPHENIDATE HCL 10 MG PO TABS: 10.0000 mg | ORAL_TABLET | Freq: Three times a day (TID) | ORAL | Status: DC

## 2012-08-12 NOTE — Telephone Encounter (Signed)
OV 08/08/12 Last filled 07/10/12 # 90 no refills   Plz advise    MW

## 2012-09-02 ENCOUNTER — Encounter: Payer: Self-pay | Admitting: Family

## 2012-09-02 ENCOUNTER — Ambulatory Visit (INDEPENDENT_AMBULATORY_CARE_PROVIDER_SITE_OTHER): Payer: BC Managed Care – PPO | Admitting: Family

## 2012-09-02 ENCOUNTER — Ambulatory Visit (INDEPENDENT_AMBULATORY_CARE_PROVIDER_SITE_OTHER): Payer: BC Managed Care – PPO

## 2012-09-02 VITALS — BP 110/78 | HR 74 | Temp 98.0°F | Resp 16 | Ht 67.0 in | Wt 167.1 lb

## 2012-09-02 DIAGNOSIS — K219 Gastro-esophageal reflux disease without esophagitis: Secondary | ICD-10-CM

## 2012-09-02 DIAGNOSIS — L501 Idiopathic urticaria: Secondary | ICD-10-CM

## 2012-09-02 DIAGNOSIS — E538 Deficiency of other specified B group vitamins: Secondary | ICD-10-CM

## 2012-09-02 MED ORDER — CYANOCOBALAMIN 1000 MCG/ML IJ SOLN
1000.0000 ug | Freq: Once | INTRAMUSCULAR | Status: AC
  Administered 2012-09-02: 1000 ug via INTRAMUSCULAR

## 2012-09-02 MED ORDER — METHYLPREDNISOLONE (PAK) 4 MG PO TABS: ORAL_TABLET | ORAL | Status: DC

## 2012-09-02 MED ORDER — FAMOTIDINE 40 MG PO TABS: 40.0000 mg | ORAL_TABLET | Freq: Two times a day (BID) | ORAL | Status: DC

## 2012-09-02 NOTE — Assessment & Plan Note (Signed)
Stop nexium, trial of pepcid for both GERD and possible antihistamine effects in setting of urticaria.

## 2012-09-02 NOTE — Patient Instructions (Addendum)
Please call if symptoms worsen or do not improve.  Go to ER if you develop tongue/lip swelling. Keep upcoming appointment with allergist.  Follow up with Dr. Laury Axon in 6 weeks.

## 2012-09-02 NOTE — Assessment & Plan Note (Addendum)
She is unable to tolerate further upward titration of anti-histamines.  She has GERD hx.  Symptoms started around the time that she was switched from omeprazole to nexium. It is possible that nexium allergy could be culprit.  Will stop nexium, start PPI.  Medrol dose pak.  Keep upcoming appointment with Allergist.

## 2012-09-02 NOTE — Progress Notes (Signed)
Subjective:    Patient ID: Shelby Warren, female    DOB: 1965/11/26, 46 y.o.   MRN: [id number]  HPI  [name] is a 46 yr old female who presents today for follow up of her Urticaria. She reports that she was initially evaluated by Dr. Laury Axon for this in early October. Later in October she was re-evaluated by Dr. Beverely Low. She has met with Dr. Irena Cords who did further allergy testing which was reportedly normal. Since that time, he has been increasing her antihistamine dosages. As a result she feels very drowsy and finds that this is interfering with her quality of life. She is scheduled for an apt for a second opinion with a new allergist on 12/24.    She reports that she continues to have hives.  They are generally located on her trunk and legs and are intermittent. Generally flare up at night.  Intensely pruritic.  Not associated with SOB, tongue/lip swelling.   Only slightly improved since she started antihistamines.  She is very frustrated at this point.   Review of Systems    see HPI  Past Medical History  Diagnosis Date  . Allergic rhinitis   . Hypothyroidism   . ADD (attention deficit disorder)   . Asthma     History   Social History  . Marital Status: Married    Spouse Name: N/A    Number of Children: N/A  . Years of Education: N/A   Occupational History  .  [employer]  . Homemaker    Social History Main Topics  . Smoking status: Never Smoker   . Smokeless tobacco: Not on file  . Alcohol Use: Yes  . Drug Use: No  . Sexually Active: Not on file   Other Topics Concern  . Not on file   Social History Narrative  . No narrative on file    Past Surgical History  Procedure Date  . Anal fissure repair   . Spincture muscle   . Lasic eye surgery     Family History  Problem Relation Age of Onset  . Breast cancer Paternal Grandmother   . Diabetes Maternal Grandfather     Allergies  Allergen Reactions  . Penicillins     Current Outpatient  Prescriptions on File Prior to Visit  Medication Sig Dispense Refill  . Cetirizine HCl (ZYRTEC ALLERGY) 10 MG CAPS Take 20 mg by mouth daily.       . fexofenadine (ALLEGRA) 180 MG tablet Take 2 tablets every morning.      . hydrOXYzine (ATARAX/VISTARIL) 10 MG tablet Take 20 mg by mouth at bedtime.       Marland Kitchen levothyroxine (SYNTHROID, LEVOTHROID) 125 MCG tablet 1 po qd  90 tablet  0  . methylphenidate (RITALIN) 10 MG tablet Take 1 tablet (10 mg total) by mouth 3 (three) times daily.  90 tablet  0  . montelukast (SINGULAIR) 10 MG tablet Take 1 tablet (10 mg total) by mouth at bedtime.  30 tablet  3  . Nutritional Supplements (JUICE PLUS FIBRE PO) Take 5 each by mouth daily. Gummies.       . VENTOLIN HFA 108 (90 BASE) MCG/ACT inhaler USE 2 PUFFS BY MOUTH EVERY 4 TO 6 HOURS AS NEEDED  1 Inhaler  2  . famotidine (PEPCID) 40 MG tablet Take 1 tablet (40 mg total) by mouth 2 (two) times daily.  60 tablet  1  . ipratropium-albuterol (DUONEB) 0.5-2.5 (3) MG/3ML SOLN Take 3 mLs by nebulization  every 6 (six) hours as needed.       . [DISCONTINUED] methylphenidate (RITALIN) 10 MG tablet 1 po tid  90 tablet  0   Current Facility-Administered Medications on File Prior to Visit  Medication Dose Route Frequency Provider Last Rate Last Dose  . [COMPLETED] cyanocobalamin ((VITAMIN B-12)) injection 1,000 mcg  1,000 mcg Intramuscular Once Grayling Congress Lowne, DO   1,000 mcg at 09/02/12 1002    BP 110/78  Pulse 74  Temp 98 F (36.7 C) (Oral)  Resp 16  Ht 5\' 7"  (1.702 m)  Wt 167 lb 1.3 oz (75.787 kg)  BMI 26.17 kg/m2  SpO2 99%    Objective:   Physical Exam  Constitutional: She appears well-developed and well-nourished. No distress.  HENT:  Head: Normocephalic and atraumatic.  Cardiovascular: Normal rate and regular rhythm.   No murmur heard. Pulmonary/Chest: Effort normal and breath sounds normal. No respiratory distress. She has no wheezes. She has no rales. She exhibits no tenderness.  Skin: Skin is warm  and dry.       Urticaria noted on abdomen and back.   Psychiatric: She has a normal mood and affect. Her behavior is normal. Judgment and thought content normal.          Assessment & Plan:  25 minutes spent with pt today.  >50% of this time was spent counseling pt on her urticaria and treatment.

## 2012-09-09 ENCOUNTER — Other Ambulatory Visit: Payer: Self-pay

## 2012-09-09 DIAGNOSIS — F988 Other specified behavioral and emotional disorders with onset usually occurring in childhood and adolescence: Secondary | ICD-10-CM

## 2012-09-09 MED ORDER — METHYLPHENIDATE HCL 10 MG PO TABS: 10.0000 mg | ORAL_TABLET | Freq: Three times a day (TID) | ORAL | Status: DC

## 2012-09-09 NOTE — Telephone Encounter (Signed)
Patient aware Rx ready for pick up.      KP 

## 2012-09-09 NOTE — Telephone Encounter (Signed)
Message copied by Arnette Norris on Tue Sep 09, 2012  5:22 PM ------      Message from: Baldwin [place]      Created: Tue Sep 09, 2012 11:40 AM      Regarding: Ritalin Refill       Pt called need refill for Ritalin, takes 3 tabs qd CB # [phone]

## 2012-09-23 ENCOUNTER — Encounter: Payer: Self-pay | Admitting: Internal Medicine

## 2012-09-23 ENCOUNTER — Other Ambulatory Visit (INDEPENDENT_AMBULATORY_CARE_PROVIDER_SITE_OTHER): Payer: BC Managed Care – PPO

## 2012-09-23 ENCOUNTER — Ambulatory Visit (INDEPENDENT_AMBULATORY_CARE_PROVIDER_SITE_OTHER): Payer: BC Managed Care – PPO | Admitting: Internal Medicine

## 2012-09-23 VITALS — BP 118/80 | HR 79 | Ht 66.5 in | Wt 168.6 lb

## 2012-09-23 DIAGNOSIS — L501 Idiopathic urticaria: Secondary | ICD-10-CM

## 2012-09-23 DIAGNOSIS — L509 Urticaria, unspecified: Secondary | ICD-10-CM

## 2012-09-23 LAB — SEDIMENTATION RATE: Sed Rate: 6 mm/hr (ref 0–22)

## 2012-09-23 NOTE — Progress Notes (Deleted)
  Subjective:    Patient ID: Shelby Warren, female    DOB: 02-Nov-1965, 46 y.o.   MRN: [id number]  HPI    Review of Systems  Constitutional: Negative for fever and unexpected weight change.  HENT: Negative for ear pain, nosebleeds, congestion, sore throat, rhinorrhea, sneezing, trouble swallowing, dental problem, postnasal drip and sinus pressure.   Eyes: Negative for redness and itching.  Respiratory: Positive for shortness of breath. Negative for cough, chest tightness and wheezing.   Cardiovascular: Negative for palpitations and leg swelling.  Gastrointestinal: Negative for nausea and vomiting.  Genitourinary: Negative for dysuria.  Musculoskeletal: Negative for joint swelling.  Skin: Negative for rash.  Neurological: Negative for headaches.  Hematological: Does not bruise/bleed easily.  Psychiatric/Behavioral: Negative for dysphoric mood. The patient is nervous/anxious.        Objective:   Physical Exam        Assessment & Plan:

## 2012-09-23 NOTE — Progress Notes (Signed)
09/23/12- 46 yo F never smoker referred for allergy evaluation courtesy of Dr Laury Axon Ref by Dr. Pecola Lawless hives reaction unsure of what allergen is, has been occuring 2-2 1/2 months since onset in October of 2013. At that time she was under job stress and she had just changed to omeprazole to Nexium. Hives persisted. Nexium was then changed to Pepcid a.c. The hives seem to resolve until 2 days before this visit. She had allergy evaluation with Dr. Madie Reno in October. She has been taking Allegra 180 mg x2 daily, Pepcid AC, Zyrtec and Singulair. At bedtime she has tried hydroxyzine 20 mg. Her last dose of prednisone was completed 3 weeks ago. Today she woke with nasal congestion but nasal symptoms and chest tightness or wheezing have not really been part of this illness up to now. She has known history of seafood allergy triggering hives and angioedema with asthma 20 years ago. She has not recognized problems from cosmetics, soaps or other exposures but is using hypoallergenic cleansers. Her job for [employer] requires in-state travel, but no suspect exposures. Social alcohol. No recognized environmental exposure triggers. Son gets hives from Z-Pak  Lab- thyroid function, chemistry profile and food IgE profiles were negative. CBC from October 31 showed WBC 10,900 without elevated eosinophils. Seafood allergy panel negative. Gen. [place] allergy IgE panel on 07/18/2012 showed elevation to cat and dog. Food IgG elevation for egg white, milk, peanut and wheat.  Prior to Admission medications   Medication Sig Start Date End Date Taking? Authorizing Provider  Cetirizine HCl (ZYRTEC ALLERGY) 10 MG CAPS Take 20 mg by mouth daily.    Yes Historical Provider, MD  famotidine (PEPCID) 40 MG tablet Take 1 tablet (40 mg total) by mouth 2 (two) times daily. 09/02/12  Yes Sandford Craze, NP  fexofenadine (ALLEGRA) 180 MG tablet Take 2 tablets every morning.   Yes Historical Provider, MD  hydrOXYzine  (ATARAX/VISTARIL) 10 MG tablet Take 20 mg by mouth at bedtime.  07/30/12  Yes Historical Provider, MD  levothyroxine (SYNTHROID, LEVOTHROID) 125 MCG tablet 1 po qd 07/04/12  Yes Lelon Perla, DO  methylphenidate (RITALIN) 10 MG tablet Take 1 tablet (10 mg total) by mouth 3 (three) times daily. 09/09/12 10/09/12 Yes Yvonne R Lowne, DO  montelukast (SINGULAIR) 10 MG tablet Take 1 tablet (10 mg total) by mouth at bedtime. 07/17/12  Yes Sheliah Hatch, MD  Nutritional Supplements (JUICE PLUS FIBRE PO) Take 5 each by mouth daily. Gummies.    Yes Historical Provider, MD  UNABLE TO FIND Med Name: Vitamin B 12 injection   Yes Historical Provider, MD  VENTOLIN HFA 108 (90 BASE) MCG/ACT inhaler USE 2 PUFFS BY MOUTH EVERY 4 TO 6 HOURS AS NEEDED 12/12/11  Yes Lelon Perla, DO   Past Medical History  Diagnosis Date  . Allergic rhinitis   . Hypothyroidism   . ADD (attention deficit disorder)   . Asthma    Past Surgical History  Procedure Date  . Anal fissure repair   . Spincture muscle   . Lasic eye surgery    Family History  Problem Relation Age of Onset  . Breast cancer Paternal Grandmother   . Diabetes Maternal Grandfather    History   Social History  . Marital Status: Married    Spouse Name: N/A    Number of Children: 2  . Years of Education: N/A   Occupational History  .  [employer]  . Homemaker    Social History Main  Topics  . Smoking status: Never Smoker   . Smokeless tobacco: Never Used  . Alcohol Use: Yes     Comment: occasional   . Drug Use: No  . Sexually Active: Not on file   Other Topics Concern  . Not on file   Social History Narrative  . No narrative on file   ROS-see HPI Constitutional:   No-   weight loss, night sweats, fevers, chills, fatigue, lassitude. HEENT:   No-  headaches, difficulty swallowing, tooth/dental problems, sore throat,       No-  sneezing, itching, ear ache, nasal congestion, post nasal drip,  CV:  No-   chest pain, orthopnea,  PND, swelling in lower extremities, anasarca, dizziness, palpitations Resp: No-   shortness of breath with exertion or at rest.              No-   productive cough,  No non-productive cough,  No- coughing up of blood.              No-   change in color of mucus.  No- wheezing.   Skin: No- GI:  No-   heartburn, indigestion, abdominal pain, nausea, vomiting, diarrhea,                 change in bowel habits, loss of appetite GU: No-   dysuria, change in color of urine, no urgency or frequency.  No- flank pain. MS:  No-   joint pain or swelling.  No- decreased range of motion.  No- back pain. Neuro-     nothing unusual Psych:  No- change in mood or affect. No depression or anxiety( " not currently").  No memory loss.  OBJ- Physical Exam General- Alert, Oriented, Affect-appropriate, Distress- none acute. Appears well Skin- very minor residual hives on arm Lymphadenopathy- none Head- atraumatic            Eyes- Gross vision intact, PERRLA, conjunctivae and secretions clear            Ears- Hearing, canals-normal            Nose- Clear, no-Septal dev, mucus, polyps, erosion, perforation             Throat- Mallampati II , mucosa clear , drainage- none, tonsils- atrophic Neck- flexible , trachea midline, no stridor , thyroid nl, carotid no bruit Chest - symmetrical excursion , unlabored           Heart/CV- RRR , no murmur , no gallop  , no rub, nl s1 s2                           - JVD- none , edema- none, stasis changes- none, varices- none           Lung- clear to P&A, wheeze- none, cough- none , dullness-none, rub- none           Chest wall-  Abd- tender-no, distended-no, bowel sounds-present, HSM- no Br/ Gen/ Rectal- Not done, not indicated Extrem- cyanosis- none, clubbing, none, atrophy- none, strength- nl Neuro- grossly intact to observation

## 2012-09-23 NOTE — Patient Instructions (Addendum)
Order- lab- ANA, sed rate, Allergy profile    Dx urticaria  Reduce hydroxyzine to one at bedtime

## 2012-09-24 LAB — ALLERGY FULL PROFILE
Alternaria Alternata: <0.1 kU/L
Bahia Grass: <0.1 kU/L
Box Elder IgE: <0.1 kU/L
Candida Albicans: <0.1 kU/L
Cat Dander: 6.44 kU/L — ABNORMAL HIGH
Curvularia lunata: <0.1 kU/L
Elm IgE: <0.1 kU/L
Fescue: <0.1 kU/L
G009 Red Top: <0.1 kU/L
Lamb's Quarters: <0.1 kU/L
Oak: <0.1 kU/L
Sycamore Tree: <0.1 kU/L
Timothy Grass: <0.1 kU/L

## 2012-09-24 LAB — ANA: Anti Nuclear Antibody(ANA): NEGATIVE

## 2012-10-02 NOTE — Progress Notes (Signed)
Quick Note:  Pt aware of results and next OV with CY. ______

## 2012-10-05 NOTE — Assessment & Plan Note (Signed)
She has been heavily treated with antihistamines and is concerned about staying on these doses. Plan-look for systemic inflammation with ANA, sedimentation rate. Try reducing hydroxyzine dose at night since that is the drug that concerns her the most. Allergy profile.

## 2012-10-06 ENCOUNTER — Ambulatory Visit (INDEPENDENT_AMBULATORY_CARE_PROVIDER_SITE_OTHER): Payer: BC Managed Care – PPO

## 2012-10-06 ENCOUNTER — Telehealth: Payer: Self-pay | Admitting: *Deleted

## 2012-10-06 DIAGNOSIS — F988 Other specified behavioral and emotional disorders with onset usually occurring in childhood and adolescence: Secondary | ICD-10-CM

## 2012-10-06 DIAGNOSIS — E538 Deficiency of other specified B group vitamins: Secondary | ICD-10-CM

## 2012-10-06 MED ORDER — METHYLPHENIDATE HCL 10 MG PO TABS: 10.0000 mg | ORAL_TABLET | Freq: Three times a day (TID) | ORAL | Status: DC

## 2012-10-06 MED ORDER — CYANOCOBALAMIN 1000 MCG/ML IJ SOLN: 1000.0000 ug | Freq: Once | INTRAMUSCULAR | Status: DC

## 2012-10-06 NOTE — Telephone Encounter (Signed)
Ok to refill if seen in last 6 months----can get 3 months (3 sep rx) if pt would like.

## 2012-10-06 NOTE — Telephone Encounter (Signed)
VM left advising patient Rx ready for pick up.     KP

## 2012-10-06 NOTE — Addendum Note (Signed)
Addended by: Arnette Norris on: 10/06/2012 01:28 PM   Modules accepted: Orders

## 2012-10-06 NOTE — Telephone Encounter (Signed)
Patient wants refill on Ritalin, last OV 09/02/12 @[place] , last refill 09/09/12 #90. Please advise.

## 2012-10-28 ENCOUNTER — Other Ambulatory Visit: Payer: Self-pay | Admitting: Family Medicine

## 2012-10-30 ENCOUNTER — Ambulatory Visit: Payer: BC Managed Care – PPO | Admitting: Internal Medicine

## 2012-10-31 ENCOUNTER — Other Ambulatory Visit: Payer: Self-pay | Admitting: Family Medicine

## 2012-11-04 ENCOUNTER — Ambulatory Visit (INDEPENDENT_AMBULATORY_CARE_PROVIDER_SITE_OTHER): Payer: BC Managed Care – PPO | Admitting: *Deleted

## 2012-11-04 DIAGNOSIS — E538 Deficiency of other specified B group vitamins: Secondary | ICD-10-CM

## 2012-11-04 MED ORDER — CYANOCOBALAMIN 1000 MCG/ML IJ SOLN
1000.0000 ug | Freq: Once | INTRAMUSCULAR | Status: AC
  Administered 2012-11-04: 1000 ug via INTRAMUSCULAR

## 2012-11-05 ENCOUNTER — Other Ambulatory Visit: Payer: Self-pay | Admitting: Family

## 2012-11-10 ENCOUNTER — Other Ambulatory Visit: Payer: Self-pay | Admitting: *Deleted

## 2012-11-10 ENCOUNTER — Telehealth: Payer: Self-pay | Admitting: *Deleted

## 2012-11-10 DIAGNOSIS — F988 Other specified behavioral and emotional disorders with onset usually occurring in childhood and adolescence: Secondary | ICD-10-CM

## 2012-11-10 MED ORDER — METHYLPHENIDATE HCL 10 MG PO TABS: 10.0000 mg | ORAL_TABLET | Freq: Three times a day (TID) | ORAL | Status: DC

## 2012-11-10 NOTE — Telephone Encounter (Signed)
Message copied by Verdene Rio on Mon Nov 10, 2012  2:24 PM ------      Message from: MCDANIELS, [place] R      Created: Mon Nov 10, 2012  1:28 PM      Contact: [name]       Pt states she returned your call from Friday--pt stated she did get message about RX being ready but was returning your call about something      Cb# [id number] ------

## 2012-11-10 NOTE — Telephone Encounter (Signed)
Ok to take robitussin dm

## 2012-11-10 NOTE — Telephone Encounter (Signed)
Rx ready for pick up left Pt detail message.

## 2012-11-10 NOTE — Telephone Encounter (Signed)
Spoken with patient advised that it is okay to take Robitussin DM for cough per Dr. Laury Axon

## 2012-11-10 NOTE — Telephone Encounter (Signed)
Pt c/o dry cough. Pt would like to know what she can take or if something can be Rx. Pt would like to know if it is ok to do robitussin DM. Pt notes that she take 6 antihistamine daily. Pt denies any other symptoms.Please advise

## 2012-11-18 ENCOUNTER — Other Ambulatory Visit: Payer: BC Managed Care – PPO

## 2012-11-24 ENCOUNTER — Other Ambulatory Visit (INDEPENDENT_AMBULATORY_CARE_PROVIDER_SITE_OTHER): Payer: BC Managed Care – PPO

## 2012-11-24 DIAGNOSIS — E538 Deficiency of other specified B group vitamins: Secondary | ICD-10-CM

## 2012-11-24 LAB — VITAMIN B12: Vitamin B-12: 387 pg/mL (ref 211–911)

## 2012-12-10 ENCOUNTER — Telehealth: Payer: Self-pay | Admitting: Family Medicine

## 2012-12-10 DIAGNOSIS — F988 Other specified behavioral and emotional disorders with onset usually occurring in childhood and adolescence: Secondary | ICD-10-CM

## 2012-12-10 MED ORDER — METHYLPHENIDATE HCL 10 MG PO TABS: 10.0000 mg | ORAL_TABLET | Freq: Three times a day (TID) | ORAL | Status: DC

## 2012-12-10 NOTE — Telephone Encounter (Signed)
msg left making the patient aware Rx ready for pick up.    KP

## 2012-12-10 NOTE — Telephone Encounter (Signed)
refill Methylphenidate HCl (Tab) RITALIN 10 MG Take 1 tablet (10 mg total) by mouth 3 (three) times daily. #90 last fill 2.11.14--Hand wrt notes says please call patient when ready for pick up   NOTE pt has mychart

## 2012-12-11 ENCOUNTER — Encounter: Payer: Self-pay | Admitting: Family Medicine

## 2012-12-12 ENCOUNTER — Ambulatory Visit (INDEPENDENT_AMBULATORY_CARE_PROVIDER_SITE_OTHER): Payer: BC Managed Care – PPO | Admitting: *Deleted

## 2012-12-12 DIAGNOSIS — E538 Deficiency of other specified B group vitamins: Secondary | ICD-10-CM

## 2012-12-12 MED ORDER — CYANOCOBALAMIN 1000 MCG/ML IJ SOLN: 1000.0000 ug | Freq: Once | INTRAMUSCULAR | Status: DC

## 2012-12-19 ENCOUNTER — Other Ambulatory Visit: Payer: Self-pay | Admitting: Family Medicine

## 2012-12-29 ENCOUNTER — Telehealth: Payer: Self-pay | Admitting: Family Medicine

## 2012-12-29 ENCOUNTER — Other Ambulatory Visit: Payer: Self-pay | Admitting: Family Medicine

## 2012-12-29 MED ORDER — FAMOTIDINE 40 MG PO TABS: ORAL_TABLET | ORAL | Status: DC

## 2012-12-29 NOTE — Telephone Encounter (Signed)
Refill: Famotidine 40 mg tablet. Take 1 tablet by mouth twice a day. Qty 60. Last fill 12-02-12

## 2013-01-07 ENCOUNTER — Telehealth: Payer: Self-pay | Admitting: Family Medicine

## 2013-01-07 DIAGNOSIS — F988 Other specified behavioral and emotional disorders with onset usually occurring in childhood and adolescence: Secondary | ICD-10-CM

## 2013-01-07 MED ORDER — METHYLPHENIDATE HCL 10 MG PO TABS: 10.0000 mg | ORAL_TABLET | Freq: Three times a day (TID) | ORAL | Status: DC

## 2013-01-07 NOTE — Telephone Encounter (Signed)
Msg left advising Rx ready for pick up tomorrow     KP

## 2013-01-07 NOTE — Telephone Encounter (Signed)
[facility] called on behalf of the patient to request rx for Ritalin. Call pt at [phone] when ready for pick up.

## 2013-01-09 ENCOUNTER — Ambulatory Visit (INDEPENDENT_AMBULATORY_CARE_PROVIDER_SITE_OTHER): Payer: BC Managed Care – PPO | Admitting: *Deleted

## 2013-01-09 DIAGNOSIS — E538 Deficiency of other specified B group vitamins: Secondary | ICD-10-CM

## 2013-01-09 MED ORDER — CYANOCOBALAMIN 1000 MCG/ML IJ SOLN
1000.0000 ug | Freq: Once | INTRAMUSCULAR | Status: AC
  Administered 2013-01-09: 1000 ug via INTRAMUSCULAR

## 2013-02-06 ENCOUNTER — Ambulatory Visit (INDEPENDENT_AMBULATORY_CARE_PROVIDER_SITE_OTHER): Payer: BC Managed Care – PPO | Admitting: *Deleted

## 2013-02-06 DIAGNOSIS — E538 Deficiency of other specified B group vitamins: Secondary | ICD-10-CM

## 2013-02-06 MED ORDER — CYANOCOBALAMIN 1000 MCG/ML IJ SOLN
1000.0000 ug | Freq: Once | INTRAMUSCULAR | Status: AC
  Administered 2013-02-06: 1000 ug via INTRAMUSCULAR

## 2013-02-09 ENCOUNTER — Telehealth: Payer: Self-pay | Admitting: *Deleted

## 2013-02-09 DIAGNOSIS — F988 Other specified behavioral and emotional disorders with onset usually occurring in childhood and adolescence: Secondary | ICD-10-CM

## 2013-02-09 MED ORDER — METHYLPHENIDATE HCL 10 MG PO TABS: 10.0000 mg | ORAL_TABLET | Freq: Three times a day (TID) | ORAL | Status: DC

## 2013-02-09 NOTE — Telephone Encounter (Signed)
Refill x1 ---pt due for ov

## 2013-02-09 NOTE — Telephone Encounter (Signed)
Last OV 08-08-12, last filled 01-07-13 #90

## 2013-02-09 NOTE — Telephone Encounter (Signed)
VM left advising Office visit due now and Rx ready for pick up.     KP

## 2013-02-25 ENCOUNTER — Encounter: Payer: Self-pay | Admitting: Lab

## 2013-02-26 ENCOUNTER — Ambulatory Visit (INDEPENDENT_AMBULATORY_CARE_PROVIDER_SITE_OTHER): Payer: BC Managed Care – PPO | Admitting: Family Medicine

## 2013-02-26 ENCOUNTER — Encounter: Payer: Self-pay | Admitting: Family Medicine

## 2013-02-26 VITALS — BP 116/74 | HR 74 | Temp 99.2°F | Wt 176.2 lb

## 2013-02-26 DIAGNOSIS — E538 Deficiency of other specified B group vitamins: Secondary | ICD-10-CM

## 2013-02-26 DIAGNOSIS — F988 Other specified behavioral and emotional disorders with onset usually occurring in childhood and adolescence: Secondary | ICD-10-CM

## 2013-02-26 NOTE — Assessment & Plan Note (Signed)
Stable con't meds 

## 2013-02-26 NOTE — Patient Instructions (Addendum)
Attention Deficit Hyperactivity Disorder Attention deficit hyperactivity disorder (ADHD) is a problem with behavior issues based on the way the brain functions (neurobehavioral disorder). It is a common reason for behavior and academic problems in school. CAUSES  The cause of ADHD is unknown in most cases. It may run in families. It sometimes can be associated with learning disabilities and other behavioral problems. SYMPTOMS  There are 3 types of ADHD. The 3 types and some of the symptoms include:  Inattentive  Gets bored or distracted easily.  Loses or forgets things. Forgets to hand in homework.  Has trouble organizing or completing tasks.  Difficulty staying on task.  An inability to organize daily tasks and school work.  Leaving projects, chores, or homework unfinished.  Trouble paying attention or responding to details. Careless mistakes.  Difficulty following directions. Often seems like is not listening.  Dislikes activities that require sustained attention (like chores or homework).  Hyperactive-impulsive  Feels like it is impossible to sit still or stay in a seat. Fidgeting with hands and feet.  Trouble waiting turn.  Talking too much or out of turn. Interruptive.  Speaks or acts impulsively.  Aggressive, disruptive behavior.  Constantly busy or on the go, noisy.  Combined  Has symptoms of both of the above. Often children with ADHD feel discouraged about themselves and with school. They often perform well below their abilities in school. These symptoms can cause problems in home, school, and in relationships with peers. As children get older, the excess motor activities can calm down, but the problems with paying attention and staying organized persist. Most children do not outgrow ADHD but with good treatment can learn to cope with the symptoms. DIAGNOSIS  When ADHD is suspected, the diagnosis should be made by professionals trained in ADHD.  Diagnosis will  include:  Ruling out other reasons for the child's behavior.  The caregivers will check with the child's school and check their medical records.  They will talk to teachers and parents.  Behavior rating scales for the child will be filled out by those dealing with the child on a daily basis. A diagnosis is made only after all information has been considered. TREATMENT  Treatment usually includes behavioral treatment often along with medicines. It may include stimulant medicines. The stimulant medicines decrease impulsivity and hyperactivity and increase attention. Other medicines used include antidepressants and certain blood pressure medicines. Most experts agree that treatment for ADHD should address all aspects of the child's functioning. Treatment should not be limited to the use of medicines alone. Treatment should include structured classroom management. The parents must receive education to address rewarding good behavior, discipline, and limit-setting. Tutoring or behavioral therapy or both should be available for the child. If untreated, the disorder can have long-term serious effects into adolescence and adulthood. HOME CARE INSTRUCTIONS   Often with ADHD there is a lot of frustration among the family in dealing with the illness. There is often blame and anger that is not warranted. This is a life long illness. There is no way to prevent ADHD. In many cases, because the problem affects the family as a whole, the entire family may need help. A therapist can help the family find better ways to handle the disruptive behaviors and promote change. If the child is young, most of the therapist's work is with the parents. Parents will learn techniques for coping with and improving their child's behavior. Sometimes only the child with the ADHD needs counseling. Your caregivers can help   you make these decisions.  Children with ADHD may need help in organizing. Some helpful tips include:  Keep  routines the same every day from wake-up time to bedtime. Schedule everything. This includes homework and playtime. This should include outdoor and indoor recreation. Keep the schedule on the refrigerator or a bulletin board where it is frequently seen. Mark schedule changes as far in advance as possible.  Have a place for everything and keep everything in its place. This includes clothing, backpacks, and school supplies.  Encourage writing down assignments and bringing home needed books.  Offer your child a well-balanced diet. Breakfast is especially important for school performance. Children should avoid drinks with caffeine including:  Soft drinks.  Coffee.  Tea.  However, some older children (adolescents) may find these drinks helpful in improving their attention.  Children with ADHD need consistent rules that they can understand and follow. If rules are followed, give small rewards. Children with ADHD often receive, and expect, criticism. Look for good behavior and praise it. Set realistic goals. Give clear instructions. Look for activities that can foster success and self-esteem. Make time for pleasant activities with your child. Give lots of affection.  Parents are their children's greatest advocates. Learn as much as possible about ADHD. This helps you become a stronger and better advocate for your child. It also helps you educate your child's teachers and instructors if they feel inadequate in these areas. Parent support groups are often helpful. A national group with local chapters is called CHADD (Children and Adults with Attention Deficit Hyperactivity Disorder). PROGNOSIS  There is no cure for ADHD. Children with the disorder seldom outgrow it. Many find adaptive ways to accommodate the ADHD as they mature. SEEK MEDICAL CARE IF:  Your child has repeated muscle twitches, cough or speech outbursts.  Your child has sleep problems.  Your child has a marked loss of  appetite.  Your child develops depression.  Your child has new or worsening behavioral problems.  Your child develops dizziness.  Your child has a racing heart.  Your child has stomach pains.  Your child develops headaches. Document Released: 09/07/2002 Document Revised: 12/10/2011 Document Reviewed: 04/19/2008 Exit[employer] Patient Information 2014 [employer].  

## 2013-02-26 NOTE — Assessment & Plan Note (Signed)
Check lab today

## 2013-02-26 NOTE — Progress Notes (Signed)
  Subjective:    Patient ID: Shelby Warren, female    DOB: Jul 06, 1966, 47 y.o.   MRN: [id number]  HPI Pt here to f/u ADD.  Pt doing well with Ritalin, no complaints.   Pt also here to f/u B12.  She needs to have labs check.  No new complaints.     Review of Systems    as above Objective:   Physical Exam  BP 116/74  Pulse 74  Temp(Src) 99.2 F (37.3 C) (Oral)  Wt 176 lb 3.2 oz (79.924 kg)  BMI 28.02 kg/m2  SpO2 98% General appearance: alert, cooperative, appears stated age and no distress Lungs: clear to auscultation bilaterally Heart: S1, S2 normal Psych-  No anxiety, no depression      Assessment & Plan:

## 2013-03-09 ENCOUNTER — Ambulatory Visit (INDEPENDENT_AMBULATORY_CARE_PROVIDER_SITE_OTHER): Payer: BC Managed Care – PPO | Admitting: General Practice

## 2013-03-09 ENCOUNTER — Other Ambulatory Visit: Payer: Self-pay | Admitting: General Practice

## 2013-03-09 DIAGNOSIS — F988 Other specified behavioral and emotional disorders with onset usually occurring in childhood and adolescence: Secondary | ICD-10-CM

## 2013-03-09 DIAGNOSIS — E538 Deficiency of other specified B group vitamins: Secondary | ICD-10-CM

## 2013-03-09 MED ORDER — CYANOCOBALAMIN 1000 MCG/ML IJ SOLN
1000.0000 ug | Freq: Once | INTRAMUSCULAR | Status: AC
  Administered 2013-03-09: 1000 ug via INTRAMUSCULAR

## 2013-03-09 MED ORDER — METHYLPHENIDATE HCL 10 MG PO TABS: 10.0000 mg | ORAL_TABLET | Freq: Three times a day (TID) | ORAL | Status: DC

## 2013-03-09 NOTE — Telephone Encounter (Signed)
methylphenidate (RITALIN) 10 MG tablet Last OV 02-26-13 Med filled 02-09-13 #90 with 0 refills   Med filled.

## 2013-03-17 ENCOUNTER — Other Ambulatory Visit: Payer: Self-pay | Admitting: Family Medicine

## 2013-03-23 ENCOUNTER — Encounter: Payer: Self-pay | Admitting: Family Medicine

## 2013-04-07 ENCOUNTER — Telehealth: Payer: Self-pay | Admitting: Family Medicine

## 2013-04-07 ENCOUNTER — Other Ambulatory Visit: Payer: Self-pay | Admitting: Family Medicine

## 2013-04-07 DIAGNOSIS — F988 Other specified behavioral and emotional disorders with onset usually occurring in childhood and adolescence: Secondary | ICD-10-CM

## 2013-04-07 MED ORDER — METHYLPHENIDATE HCL 10 MG PO TABS: 10.0000 mg | ORAL_TABLET | Freq: Three times a day (TID) | ORAL | Status: DC

## 2013-04-07 NOTE — Telephone Encounter (Signed)
Patient aware Rx ready for pick up tomorrow.    KP 

## 2013-04-07 NOTE — Telephone Encounter (Signed)
Patient called requesting rx for ritalin. She states she has enough to last her until Sunday at noon, but she is going out of town. Call [phone]

## 2013-04-13 ENCOUNTER — Ambulatory Visit (INDEPENDENT_AMBULATORY_CARE_PROVIDER_SITE_OTHER): Payer: BC Managed Care – PPO | Admitting: *Deleted

## 2013-04-13 DIAGNOSIS — E538 Deficiency of other specified B group vitamins: Secondary | ICD-10-CM

## 2013-04-13 MED ORDER — CYANOCOBALAMIN 1000 MCG/ML IJ SOLN
1000.0000 ug | Freq: Once | INTRAMUSCULAR | Status: AC
  Administered 2013-04-13: 1000 ug via INTRAMUSCULAR

## 2013-04-26 ENCOUNTER — Other Ambulatory Visit: Payer: Self-pay | Admitting: Family Medicine

## 2013-05-06 ENCOUNTER — Telehealth: Payer: Self-pay | Admitting: *Deleted

## 2013-05-06 DIAGNOSIS — F988 Other specified behavioral and emotional disorders with onset usually occurring in childhood and adolescence: Secondary | ICD-10-CM

## 2013-05-06 MED ORDER — METHYLPHENIDATE HCL 10 MG PO TABS: 10.0000 mg | ORAL_TABLET | Freq: Three times a day (TID) | ORAL | Status: DC

## 2013-05-06 NOTE — Telephone Encounter (Signed)
Patient is requesting refill on Ritalin. Last OV 02/26/13 with date of last refill 04/07/13 #90. Controlled substance agreement on file UDs deemed low risk. Okay to refill? Lowne pt.

## 2013-05-06 NOTE — Telephone Encounter (Signed)
Rx printed and pt made aware that it is available for pick up.

## 2013-05-06 NOTE — Telephone Encounter (Signed)
90

## 2013-05-18 ENCOUNTER — Ambulatory Visit (INDEPENDENT_AMBULATORY_CARE_PROVIDER_SITE_OTHER): Payer: BC Managed Care – PPO

## 2013-05-18 DIAGNOSIS — E538 Deficiency of other specified B group vitamins: Secondary | ICD-10-CM

## 2013-05-18 MED ORDER — CYANOCOBALAMIN 1000 MCG/ML IJ SOLN
1000.0000 ug | Freq: Once | INTRAMUSCULAR | Status: AC
  Administered 2013-05-18: 1000 ug via INTRAMUSCULAR

## 2013-06-09 ENCOUNTER — Telehealth: Payer: Self-pay | Admitting: General Practice

## 2013-06-09 DIAGNOSIS — F988 Other specified behavioral and emotional disorders with onset usually occurring in childhood and adolescence: Secondary | ICD-10-CM

## 2013-06-09 MED ORDER — METHYLPHENIDATE HCL 10 MG PO TABS: 10.0000 mg | ORAL_TABLET | Freq: Three times a day (TID) | ORAL | Status: DC

## 2013-06-09 NOTE — Telephone Encounter (Signed)
Ok to refill 3 months if pt would like

## 2013-06-09 NOTE — Telephone Encounter (Signed)
Med filled. Pt notified.  

## 2013-06-09 NOTE — Telephone Encounter (Signed)
Ritalin refill request.  Last OV 02-26-13 Med last filled 05-06-13 #90 with 0 refills  Low risk

## 2013-06-18 ENCOUNTER — Ambulatory Visit (INDEPENDENT_AMBULATORY_CARE_PROVIDER_SITE_OTHER): Payer: BC Managed Care – PPO | Admitting: *Deleted

## 2013-06-18 DIAGNOSIS — E538 Deficiency of other specified B group vitamins: Secondary | ICD-10-CM

## 2013-06-18 MED ORDER — CYANOCOBALAMIN 1000 MCG/ML IJ SOLN
1000.0000 ug | Freq: Once | INTRAMUSCULAR | Status: AC
  Administered 2013-06-18: 1000 ug via INTRAMUSCULAR

## 2013-06-18 MED ORDER — CYANOCOBALAMIN 1000 MCG/ML IJ SOLN: 1000.0000 ug | Freq: Once | INTRAMUSCULAR | Status: DC

## 2013-06-19 ENCOUNTER — Other Ambulatory Visit: Payer: Self-pay | Admitting: Family Medicine

## 2013-06-27 ENCOUNTER — Other Ambulatory Visit: Payer: Self-pay | Admitting: Family Medicine

## 2013-07-04 ENCOUNTER — Other Ambulatory Visit: Payer: Self-pay | Admitting: Family Medicine

## 2013-07-09 ENCOUNTER — Other Ambulatory Visit: Payer: Self-pay

## 2013-07-09 DIAGNOSIS — F988 Other specified behavioral and emotional disorders with onset usually occurring in childhood and adolescence: Secondary | ICD-10-CM

## 2013-07-09 MED ORDER — METHYLPHENIDATE HCL 10 MG PO TABS: 10.0000 mg | ORAL_TABLET | Freq: Three times a day (TID) | ORAL | Status: DC

## 2013-07-17 ENCOUNTER — Ambulatory Visit (INDEPENDENT_AMBULATORY_CARE_PROVIDER_SITE_OTHER): Payer: BC Managed Care – PPO | Admitting: *Deleted

## 2013-07-17 DIAGNOSIS — E538 Deficiency of other specified B group vitamins: Secondary | ICD-10-CM

## 2013-07-17 MED ORDER — CYANOCOBALAMIN 1000 MCG/ML IJ SOLN
1000.0000 ug | Freq: Once | INTRAMUSCULAR | Status: AC
  Administered 2013-07-17: 1000 ug via INTRAMUSCULAR

## 2013-08-06 ENCOUNTER — Other Ambulatory Visit: Payer: Self-pay

## 2013-08-06 DIAGNOSIS — F988 Other specified behavioral and emotional disorders with onset usually occurring in childhood and adolescence: Secondary | ICD-10-CM

## 2013-08-06 MED ORDER — METHYLPHENIDATE HCL 10 MG PO TABS
10.0000 mg | ORAL_TABLET | Freq: Three times a day (TID) | ORAL | Status: DC
Start: 2013-08-06 — End: 2013-09-07

## 2013-08-11 ENCOUNTER — Other Ambulatory Visit: Payer: Self-pay | Admitting: Family Medicine

## 2013-08-11 NOTE — Telephone Encounter (Signed)
[facility] in [place], [place]

## 2013-08-14 ENCOUNTER — Encounter: Payer: Self-pay | Admitting: Family Medicine

## 2013-08-14 ENCOUNTER — Ambulatory Visit (INDEPENDENT_AMBULATORY_CARE_PROVIDER_SITE_OTHER): Payer: BC Managed Care – PPO | Admitting: Family Medicine

## 2013-08-14 VITALS — BP 110/70 | HR 77 | Temp 97.6°F | Wt 180.0 lb

## 2013-08-14 DIAGNOSIS — F988 Other specified behavioral and emotional disorders with onset usually occurring in childhood and adolescence: Secondary | ICD-10-CM

## 2013-08-14 DIAGNOSIS — J209 Acute bronchitis, unspecified: Secondary | ICD-10-CM

## 2013-08-14 DIAGNOSIS — R062 Wheezing: Secondary | ICD-10-CM

## 2013-08-14 DIAGNOSIS — E538 Deficiency of other specified B group vitamins: Secondary | ICD-10-CM

## 2013-08-14 MED ORDER — HYDROCODONE-HOMATROPINE 5-1.5 MG/5ML PO SYRP: 5.0000 mL | ORAL_SOLUTION | Freq: Four times a day (QID) | ORAL | Status: DC | PRN

## 2013-08-14 MED ORDER — METHYLPREDNISOLONE ACETATE 80 MG/ML IJ SUSP
80.0000 mg | Freq: Once | INTRAMUSCULAR | Status: AC
  Administered 2013-08-14: 80 mg via INTRAMUSCULAR

## 2013-08-14 MED ORDER — ALBUTEROL SULFATE (2.5 MG/3ML) 0.083% IN NEBU
2.5000 mg | INHALATION_SOLUTION | Freq: Once | RESPIRATORY_TRACT | Status: AC
  Administered 2013-08-14: 2.5 mg via RESPIRATORY_TRACT

## 2013-08-14 MED ORDER — PREDNISONE 10 MG PO TABS: ORAL_TABLET | ORAL | Status: DC

## 2013-08-14 MED ORDER — AZITHROMYCIN 250 MG PO TABS: ORAL_TABLET | ORAL | Status: DC

## 2013-08-14 MED ORDER — ALBUTEROL SULFATE HFA 108 (90 BASE) MCG/ACT IN AERS: INHALATION_SPRAY | RESPIRATORY_TRACT | Status: DC

## 2013-08-14 MED ORDER — CYANOCOBALAMIN 1000 MCG/ML IJ SOLN
1000.0000 ug | Freq: Once | INTRAMUSCULAR | Status: AC
  Administered 2013-08-14: 1000 ug via INTRAMUSCULAR

## 2013-08-14 NOTE — Assessment & Plan Note (Signed)
b12 injection given 

## 2013-08-14 NOTE — Patient Instructions (Signed)

## 2013-08-14 NOTE — Progress Notes (Signed)
Pre visit review using our clinic review tool, if applicable. No additional management support is needed unless otherwise documented below in the visit note. 

## 2013-08-14 NOTE — Progress Notes (Deleted)
  Subjective:    Patient ID: Shelby Warren, female    DOB: [birth year], 47 y.o.   MRN: [id number]  HPI    Review of Systems     Objective:   Physical Exam        Assessment & Plan:

## 2013-08-14 NOTE — Assessment & Plan Note (Signed)
Refill meds rto 6 months 

## 2013-08-14 NOTE — Addendum Note (Signed)
Addended by: Arnette Norris on: 08/14/2013 11:22 AM   Modules accepted: Orders

## 2013-08-14 NOTE — Progress Notes (Signed)
  Subjective:     Shelby Warren is a 47 y.o. female here for evaluation of a cough. Onset of symptoms was 6 days ago. Symptoms have been gradually worsening since that time. The cough is dry and harsh and is aggravated by infection and reclining position. Associated symptoms include: chills, night sweats, shortness of breath and wheezing. Patient does have a history of asthma. Patient does have a history of environmental allergens. Patient has not traveled recently. Patient does not have a history of smoking. Patient has not had a previous chest x-ray. Patient has not had a PPD done.  Asthma / copd f/u  Medication compliance: yes How often is the rescue inhaler being used? bid Any side effects to meds? no Any night time symptoms? cough Any hospitalizations? no Last PFT/spirometry --?  Pt is also here for f/u ADD and b12 injections.  No other complaints. The following portions of the patient's history were reviewed and updated as appropriate: allergies, current medications, past family history, past medical history, past social history, past surgical history and problem list.  Review of Systems Pertinent items are noted in HPI.    Objective:    Oxygen saturation 97% on room air BP 110/70  Pulse 77  Temp(Src) 97.6 F (36.4 C) (Oral)  Wt 180 lb (81.647 kg)  SpO2 97% General appearance: alert, cooperative, appears stated age and mild distress Ears: normal TM's and external ear canals both ears Nose: Nares normal. Septum midline. Mucosa normal. No drainage or sinus tenderness. Throat: abnormal findings: mild oropharyngeal erythema and pnd Neck: mild anterior cervical adenopathy, supple, symmetrical, trachea midline and thyroid not enlarged, symmetric, no tenderness/mass/nodules Lungs: diminished breath sounds bilaterally and wheezes bibasilar Heart: S1, S2 normal    Assessment:    Acute Bronchitis and Asthma    Plan:    Antibiotics per medication orders. Antitussives per  medication orders. Avoid exposure to tobacco smoke and fumes. B-agonist inhaler. Call if shortness of breath worsens, blood in sputum, change in character of cough, development of fever or chills, inability to maintain nutrition and hydration. Avoid exposure to tobacco smoke and fumes.

## 2013-08-17 ENCOUNTER — Ambulatory Visit: Payer: BC Managed Care – PPO | Admitting: Family Medicine

## 2013-08-17 ENCOUNTER — Encounter: Payer: Self-pay | Admitting: Family Medicine

## 2013-08-17 MED ORDER — MOXIFLOXACIN HCL 400 MG PO TABS: 400.0000 mg | ORAL_TABLET | Freq: Every day | ORAL | Status: DC

## 2013-09-07 ENCOUNTER — Other Ambulatory Visit: Payer: Self-pay | Admitting: Family Medicine

## 2013-09-07 DIAGNOSIS — F988 Other specified behavioral and emotional disorders with onset usually occurring in childhood and adolescence: Secondary | ICD-10-CM

## 2013-09-07 MED ORDER — METHYLPHENIDATE HCL 10 MG PO TABS: 10.0000 mg | ORAL_TABLET | Freq: Three times a day (TID) | ORAL | Status: DC

## 2013-09-11 ENCOUNTER — Ambulatory Visit (INDEPENDENT_AMBULATORY_CARE_PROVIDER_SITE_OTHER): Payer: BC Managed Care – PPO | Admitting: Family Medicine

## 2013-09-11 DIAGNOSIS — E538 Deficiency of other specified B group vitamins: Secondary | ICD-10-CM

## 2013-09-11 MED ORDER — CYANOCOBALAMIN 1000 MCG/ML IJ SOLN
1000.0000 ug | Freq: Once | INTRAMUSCULAR | Status: AC
  Administered 2013-09-11: 1000 ug via INTRAMUSCULAR

## 2013-09-12 ENCOUNTER — Encounter: Payer: Self-pay | Admitting: Family Medicine

## 2013-09-12 NOTE — Progress Notes (Signed)
   Subjective:    Patient ID: Shelby Warren, female    DOB: [birth year], 47 y.o.   MRN: [id number]  HPI  Pt here for b12 only ot Review of Systems     Objective:   Physical Exam        Assessment & Plan:

## 2013-09-30 ENCOUNTER — Other Ambulatory Visit: Payer: Self-pay | Admitting: Family Medicine

## 2013-10-07 ENCOUNTER — Other Ambulatory Visit: Payer: Self-pay | Admitting: Family Medicine

## 2013-10-07 DIAGNOSIS — F988 Other specified behavioral and emotional disorders with onset usually occurring in childhood and adolescence: Secondary | ICD-10-CM

## 2013-10-07 MED ORDER — METHYLPHENIDATE HCL 10 MG PO TABS: 10.0000 mg | ORAL_TABLET | Freq: Three times a day (TID) | ORAL | Status: DC

## 2013-10-09 ENCOUNTER — Ambulatory Visit (INDEPENDENT_AMBULATORY_CARE_PROVIDER_SITE_OTHER): Payer: BC Managed Care – PPO

## 2013-10-09 DIAGNOSIS — E538 Deficiency of other specified B group vitamins: Secondary | ICD-10-CM

## 2013-10-09 MED ORDER — CYANOCOBALAMIN 1000 MCG/ML IJ SOLN
1000.0000 ug | Freq: Once | INTRAMUSCULAR | Status: AC
  Administered 2013-10-09: 1000 ug via INTRAMUSCULAR

## 2013-10-14 ENCOUNTER — Other Ambulatory Visit: Payer: Self-pay | Admitting: Family Medicine

## 2013-11-04 MED ORDER — METHYLPHENIDATE HCL 10 MG PO TABS: 10.0000 mg | ORAL_TABLET | Freq: Three times a day (TID) | ORAL | Status: DC

## 2013-11-04 NOTE — Addendum Note (Signed)
Addended by: Ewing Schlein on: 11/04/2013 09:13 AM   Modules accepted: Orders

## 2013-11-06 ENCOUNTER — Ambulatory Visit (INDEPENDENT_AMBULATORY_CARE_PROVIDER_SITE_OTHER): Payer: BC Managed Care – PPO

## 2013-11-06 DIAGNOSIS — E538 Deficiency of other specified B group vitamins: Secondary | ICD-10-CM

## 2013-11-06 MED ORDER — CYANOCOBALAMIN 1000 MCG/ML IJ SOLN
1000.0000 ug | Freq: Once | INTRAMUSCULAR | Status: AC
  Administered 2013-11-06: 1000 ug via INTRAMUSCULAR

## 2013-11-24 ENCOUNTER — Other Ambulatory Visit: Payer: Self-pay | Admitting: Family Medicine

## 2013-12-02 ENCOUNTER — Telehealth: Payer: Self-pay | Admitting: Family Medicine

## 2013-12-02 DIAGNOSIS — E039 Hypothyroidism, unspecified: Secondary | ICD-10-CM

## 2013-12-02 NOTE — Telephone Encounter (Signed)
Patient called stating she is due for labs per her rx bottle. She is scheduled for Monday 12/07/13. Can you place orders please?

## 2013-12-02 NOTE — Telephone Encounter (Signed)
Orders for TSH placed.  

## 2013-12-04 MED ORDER — METHYLPHENIDATE HCL 10 MG PO TABS: 10.0000 mg | ORAL_TABLET | Freq: Three times a day (TID) | ORAL | Status: DC

## 2013-12-04 NOTE — Addendum Note (Signed)
Addended by: Ewing Schlein on: 12/04/2013 12:16 PM   Modules accepted: Orders

## 2013-12-07 ENCOUNTER — Other Ambulatory Visit (INDEPENDENT_AMBULATORY_CARE_PROVIDER_SITE_OTHER): Payer: BC Managed Care – PPO

## 2013-12-07 ENCOUNTER — Ambulatory Visit (INDEPENDENT_AMBULATORY_CARE_PROVIDER_SITE_OTHER): Payer: BC Managed Care – PPO

## 2013-12-07 DIAGNOSIS — E538 Deficiency of other specified B group vitamins: Secondary | ICD-10-CM

## 2013-12-07 DIAGNOSIS — E039 Hypothyroidism, unspecified: Secondary | ICD-10-CM

## 2013-12-07 MED ORDER — CYANOCOBALAMIN 1000 MCG/ML IJ SOLN
1000.0000 ug | Freq: Once | INTRAMUSCULAR | Status: AC
  Administered 2013-12-07: 1000 ug via INTRAMUSCULAR

## 2013-12-08 ENCOUNTER — Other Ambulatory Visit: Payer: Self-pay | Admitting: Family Medicine

## 2013-12-08 DIAGNOSIS — E039 Hypothyroidism, unspecified: Secondary | ICD-10-CM

## 2013-12-08 LAB — TSH: TSH: 8.37 u[IU]/mL — ABNORMAL HIGH (ref 0.35–5.50)

## 2013-12-09 ENCOUNTER — Other Ambulatory Visit: Payer: Self-pay

## 2013-12-09 MED ORDER — LEVOTHYROXINE SODIUM 137 MCG PO TABS: 137.0000 ug | ORAL_TABLET | Freq: Every day | ORAL | Status: DC

## 2013-12-31 ENCOUNTER — Other Ambulatory Visit: Payer: Self-pay

## 2013-12-31 DIAGNOSIS — F988 Other specified behavioral and emotional disorders with onset usually occurring in childhood and adolescence: Secondary | ICD-10-CM

## 2013-12-31 MED ORDER — METHYLPHENIDATE HCL 10 MG PO TABS: 10.0000 mg | ORAL_TABLET | Freq: Three times a day (TID) | ORAL | Status: DC

## 2014-01-04 ENCOUNTER — Ambulatory Visit (INDEPENDENT_AMBULATORY_CARE_PROVIDER_SITE_OTHER): Payer: BC Managed Care – PPO

## 2014-01-04 DIAGNOSIS — E538 Deficiency of other specified B group vitamins: Secondary | ICD-10-CM

## 2014-01-04 MED ORDER — CYANOCOBALAMIN 1000 MCG/ML IJ SOLN
1000.0000 ug | Freq: Once | INTRAMUSCULAR | Status: AC
  Administered 2014-01-04: 1000 ug via INTRAMUSCULAR

## 2014-01-06 ENCOUNTER — Other Ambulatory Visit: Payer: Self-pay | Admitting: Family Medicine

## 2014-01-26 ENCOUNTER — Telehealth: Payer: Self-pay | Admitting: Family Medicine

## 2014-01-26 DIAGNOSIS — E538 Deficiency of other specified B group vitamins: Secondary | ICD-10-CM

## 2014-01-26 NOTE — Telephone Encounter (Signed)
A user error has taken place.

## 2014-01-26 NOTE — Telephone Encounter (Signed)
Caller name: [place] Relation to pt: Call back [place]:  Reason for call:  Pt is wanting to schedule her monthly b12 injection  02/03/14 @ 10:30.  I went ahead and put her on the RN schedule at this time and date, but I want to make sure that the orders will be put in if needed for visit.

## 2014-01-26 NOTE — Telephone Encounter (Signed)
Patient has appt for B-12 injection on 02/03/14 at 10:30. [place] for B-12 were last drawn on 02/26/13 level at that time was 406, do you want to repeat labs or go ahead and continue injection series without rechecking B-12 level? Please advise.

## 2014-01-26 NOTE — Telephone Encounter (Signed)
Yes check b12 before given injection

## 2014-01-26 NOTE — Telephone Encounter (Signed)
Left detailed message on voice mail that pt needs to have labs drawn prior to proceeding with injections of B-12. Please schedule lab appt, future orders already placed.

## 2014-01-26 NOTE — Telephone Encounter (Signed)
Error

## 2014-02-03 ENCOUNTER — Ambulatory Visit (INDEPENDENT_AMBULATORY_CARE_PROVIDER_SITE_OTHER): Payer: BC Managed Care – PPO | Admitting: *Deleted

## 2014-02-03 ENCOUNTER — Other Ambulatory Visit: Payer: Self-pay

## 2014-02-03 DIAGNOSIS — E039 Hypothyroidism, unspecified: Secondary | ICD-10-CM

## 2014-02-03 DIAGNOSIS — F988 Other specified behavioral and emotional disorders with onset usually occurring in childhood and adolescence: Secondary | ICD-10-CM

## 2014-02-03 DIAGNOSIS — E538 Deficiency of other specified B group vitamins: Secondary | ICD-10-CM

## 2014-02-03 MED ORDER — METHYLPHENIDATE HCL 10 MG PO TABS: 10.0000 mg | ORAL_TABLET | Freq: Three times a day (TID) | ORAL | Status: DC

## 2014-02-03 MED ORDER — CYANOCOBALAMIN 1000 MCG/ML IJ SOLN
1000.0000 ug | Freq: Once | INTRAMUSCULAR | Status: AC
  Administered 2014-02-03: 1000 ug via INTRAMUSCULAR

## 2014-02-15 ENCOUNTER — Other Ambulatory Visit (INDEPENDENT_AMBULATORY_CARE_PROVIDER_SITE_OTHER): Payer: BC Managed Care – PPO

## 2014-02-15 ENCOUNTER — Other Ambulatory Visit: Payer: Self-pay

## 2014-02-15 DIAGNOSIS — E039 Hypothyroidism, unspecified: Secondary | ICD-10-CM

## 2014-02-15 DIAGNOSIS — E538 Deficiency of other specified B group vitamins: Secondary | ICD-10-CM

## 2014-02-15 LAB — VITAMIN B12: VITAMIN B 12: 332 pg/mL (ref 211–911)

## 2014-02-15 LAB — TSH: TSH: 9.85 u[IU]/mL — AB (ref 0.35–4.50)

## 2014-02-15 MED ORDER — LEVOTHYROXINE SODIUM 150 MCG PO TABS: 150.0000 ug | ORAL_TABLET | Freq: Every day | ORAL | Status: DC

## 2014-03-04 ENCOUNTER — Other Ambulatory Visit: Payer: Self-pay

## 2014-03-04 DIAGNOSIS — F988 Other specified behavioral and emotional disorders with onset usually occurring in childhood and adolescence: Secondary | ICD-10-CM

## 2014-03-04 MED ORDER — METHYLPHENIDATE HCL 10 MG PO TABS: 10.0000 mg | ORAL_TABLET | Freq: Three times a day (TID) | ORAL | Status: DC

## 2014-03-05 ENCOUNTER — Ambulatory Visit (INDEPENDENT_AMBULATORY_CARE_PROVIDER_SITE_OTHER): Payer: BC Managed Care – PPO | Admitting: *Deleted

## 2014-03-05 DIAGNOSIS — E538 Deficiency of other specified B group vitamins: Secondary | ICD-10-CM

## 2014-03-05 MED ORDER — CYANOCOBALAMIN 1000 MCG/ML IJ SOLN
1000.0000 ug | Freq: Once | INTRAMUSCULAR | Status: AC
  Administered 2014-03-05: 1000 ug via INTRAMUSCULAR

## 2014-04-01 ENCOUNTER — Encounter: Payer: Self-pay | Admitting: Family Medicine

## 2014-04-01 ENCOUNTER — Ambulatory Visit (INDEPENDENT_AMBULATORY_CARE_PROVIDER_SITE_OTHER): Payer: BC Managed Care – PPO | Admitting: Family Medicine

## 2014-04-01 VITALS — BP 118/74 | HR 70 | Temp 97.8°F | Wt 170.6 lb

## 2014-04-01 DIAGNOSIS — E039 Hypothyroidism, unspecified: Secondary | ICD-10-CM

## 2014-04-01 DIAGNOSIS — Z23 Encounter for immunization: Secondary | ICD-10-CM

## 2014-04-01 DIAGNOSIS — E538 Deficiency of other specified B group vitamins: Secondary | ICD-10-CM

## 2014-04-01 DIAGNOSIS — F988 Other specified behavioral and emotional disorders with onset usually occurring in childhood and adolescence: Secondary | ICD-10-CM

## 2014-04-01 LAB — TSH: TSH: 4.26 u[IU]/mL (ref 0.35–4.50)

## 2014-04-01 MED ORDER — METHYLPHENIDATE HCL 10 MG PO TABS: 10.0000 mg | ORAL_TABLET | Freq: Three times a day (TID) | ORAL | Status: DC

## 2014-04-01 MED ORDER — CYANOCOBALAMIN 1000 MCG/ML IJ SOLN
1000.0000 ug | Freq: Once | INTRAMUSCULAR | Status: AC
  Administered 2014-04-01: 1000 ug via INTRAMUSCULAR

## 2014-04-01 NOTE — Patient Instructions (Signed)
Hypothyroidism The thyroid is a large gland located in the lower front of your neck. The thyroid gland helps control metabolism. Metabolism is how your body handles food. It controls metabolism with the hormone thyroxine. When this gland is underactive (hypothyroid), it produces too little hormone.  CAUSES These include:   Absence or destruction of thyroid tissue.  Goiter due to iodine deficiency.  Goiter due to medications.  Congenital defects (since birth).  Problems with the pituitary. This causes a lack of TSH (thyroid stimulating hormone). This hormone tells the thyroid to turn out more hormone. SYMPTOMS  Lethargy (feeling as though you have no energy)  Cold intolerance  Weight gain (in spite of normal food intake)  Dry skin  Coarse hair  Menstrual irregularity (if severe, may lead to infertility)  Slowing of thought processes Cardiac problems are also caused by insufficient amounts of thyroid hormone. Hypothyroidism in the newborn is cretinism, and is an extreme form. It is important that this form be treated adequately and immediately or it will lead rapidly to retarded physical and mental development. DIAGNOSIS  To prove hypothyroidism, your caregiver may do blood tests and ultrasound tests. Sometimes the signs are hidden. It may be necessary for your caregiver to watch this illness with blood tests either before or after diagnosis and treatment. TREATMENT  Low levels of thyroid hormone are increased by using synthetic thyroid hormone. This is a safe, effective treatment. It usually takes about four weeks to gain the full effects of the medication. After you have the full effect of the medication, it will generally take another four weeks for problems to leave. Your caregiver may start you on low doses. If you have had heart problems the dose may be gradually increased. It is generally not an emergency to get rapidly to normal. HOME CARE INSTRUCTIONS   Take your  medications as your caregiver suggests. Let your caregiver know of any medications you are taking or start taking. Your caregiver will help you with dosage schedules.  As your condition improves, your dosage needs may increase. It will be necessary to have continuing blood tests as suggested by your caregiver.  Report all suspected medication side effects to your caregiver. SEEK MEDICAL CARE IF: Seek medical care if you develop:  Sweating.  Tremulousness (tremors).  Anxiety.  Rapid weight loss.  Heat intolerance.  Emotional swings.  Diarrhea.  Weakness. SEEK IMMEDIATE MEDICAL CARE IF:  You develop chest pain, an irregular heart beat (palpitations), or a rapid heart beat. MAKE SURE YOU:   Understand these instructions.  Will watch your condition.  Will get help right away if you are not doing well or get worse. Document Released: 09/17/2005 Document Revised: 12/10/2011 Document Reviewed: 05/07/2008 Exit[employer] Patient Information 2015 [employer]. This information is not intended to replace advice given to you by your health care provider. Make sure you discuss any questions you have with your health care provider.  

## 2014-04-01 NOTE — Progress Notes (Signed)
   Subjective:    Patient ID: Shelby Warren, female    DOB: Nov 10, 1965, 48 y.o.   MRN: [id number]  HPI Pt here f/u add, thyroid and b12.  No complaints.     Review of Systems As above     Objective:   Physical Exam BP 118/74  Pulse 70  Temp(Src) 97.8 F (36.6 C) (Oral)  Wt 170 lb 9.6 oz (77.384 kg)  SpO2 97% General appearance: alert, cooperative, appears stated age and no distress Neck: no adenopathy, no carotid bruit, no JVD, supple, symmetrical, trachea midline and thyroid not enlarged, symmetric, no tenderness/mass/nodules Lungs: clear to auscultation bilaterally Heart: S1, S2 normal Extremities: extremities normal, atraumatic, no cyanosis or edema       Assessment & Plan:  1. B12 deficiency con''t injections - cyanocobalamin ((VITAMIN B-12)) injection 1,000 mcg; Inject 1 mL (1,000 mcg total) into the muscle once.  2. ADD (attention deficit disorder) Stable, cont' meds - methylphenidate (RITALIN) 10 MG tablet; Take 1 tablet (10 mg total) by mouth 3 (three) times daily.  Dispense: 90 tablet; Refill: 0  3. Unspecified hypothyroidism  check labs today - TSH

## 2014-04-01 NOTE — Addendum Note (Signed)
Addended by: Rudene Anda on: 04/01/2014 09:49 AM   Modules accepted: Orders

## 2014-04-01 NOTE — Progress Notes (Signed)
Pre visit review using our clinic review tool, if applicable. No additional management support is needed unless otherwise documented below in the visit note. 

## 2014-04-30 ENCOUNTER — Ambulatory Visit (INDEPENDENT_AMBULATORY_CARE_PROVIDER_SITE_OTHER): Payer: BC Managed Care – PPO | Admitting: *Deleted

## 2014-04-30 DIAGNOSIS — E538 Deficiency of other specified B group vitamins: Secondary | ICD-10-CM

## 2014-04-30 MED ORDER — CYANOCOBALAMIN 1000 MCG/ML IJ SOLN
1000.0000 ug | Freq: Once | INTRAMUSCULAR | Status: AC
  Administered 2014-04-30: 1000 ug via INTRAMUSCULAR

## 2014-05-04 ENCOUNTER — Other Ambulatory Visit: Payer: Self-pay

## 2014-05-04 DIAGNOSIS — F988 Other specified behavioral and emotional disorders with onset usually occurring in childhood and adolescence: Secondary | ICD-10-CM

## 2014-05-04 MED ORDER — METHYLPHENIDATE HCL 10 MG PO TABS: 10.0000 mg | ORAL_TABLET | Freq: Three times a day (TID) | ORAL | Status: DC

## 2014-05-13 ENCOUNTER — Other Ambulatory Visit: Payer: Self-pay | Admitting: Family Medicine

## 2014-05-24 ENCOUNTER — Telehealth: Payer: Self-pay | Admitting: Family Medicine

## 2014-05-24 DIAGNOSIS — E039 Hypothyroidism, unspecified: Secondary | ICD-10-CM

## 2014-05-24 NOTE — Telephone Encounter (Signed)
Ok to check TSH 244.9

## 2014-05-24 NOTE — Telephone Encounter (Signed)
Pt wanted her thryoid levels check 05/31/14 ?  She will be coming in 05/31/14 for her B12 injection as well.

## 2014-05-24 NOTE — Telephone Encounter (Signed)
TSH done 04/01/14 and was normal. Please advise    KP

## 2014-05-24 NOTE — Telephone Encounter (Signed)
Please schedule.    KP 

## 2014-05-31 ENCOUNTER — Ambulatory Visit: Payer: BC Managed Care – PPO

## 2014-05-31 ENCOUNTER — Other Ambulatory Visit (INDEPENDENT_AMBULATORY_CARE_PROVIDER_SITE_OTHER): Payer: BC Managed Care – PPO

## 2014-05-31 DIAGNOSIS — E039 Hypothyroidism, unspecified: Secondary | ICD-10-CM

## 2014-05-31 DIAGNOSIS — E538 Deficiency of other specified B group vitamins: Secondary | ICD-10-CM

## 2014-05-31 LAB — TSH: TSH: 13.18 u[IU]/mL — ABNORMAL HIGH (ref 0.35–4.50)

## 2014-05-31 MED ORDER — CYANOCOBALAMIN 1000 MCG/ML IJ SOLN
1000.0000 ug | Freq: Once | INTRAMUSCULAR | Status: AC
  Administered 2014-05-31: 1000 ug via INTRAMUSCULAR

## 2014-05-31 NOTE — Progress Notes (Signed)
Pt came in for her monthly Vit B12 injection.  Pt tolerated injection well.  Pt will schedule her next injection before leaving.//AB/CMA

## 2014-06-01 ENCOUNTER — Other Ambulatory Visit: Payer: Self-pay

## 2014-06-01 DIAGNOSIS — E039 Hypothyroidism, unspecified: Secondary | ICD-10-CM

## 2014-06-01 MED ORDER — LEVOTHYROXINE SODIUM 200 MCG PO TABS: ORAL_TABLET | ORAL | Status: DC

## 2014-06-08 ENCOUNTER — Other Ambulatory Visit: Payer: Self-pay

## 2014-06-08 DIAGNOSIS — F988 Other specified behavioral and emotional disorders with onset usually occurring in childhood and adolescence: Secondary | ICD-10-CM

## 2014-06-08 MED ORDER — METHYLPHENIDATE HCL 10 MG PO TABS
10.0000 mg | ORAL_TABLET | Freq: Three times a day (TID) | ORAL | Status: DC
Start: 2014-06-08 — End: 2014-07-09

## 2014-06-10 ENCOUNTER — Other Ambulatory Visit: Payer: Self-pay | Admitting: Family Medicine

## 2014-06-10 ENCOUNTER — Other Ambulatory Visit: Payer: Self-pay

## 2014-06-10 DIAGNOSIS — T7840XA Allergy, unspecified, initial encounter: Secondary | ICD-10-CM

## 2014-06-10 MED ORDER — PREDNISONE 10 MG PO TABS: ORAL_TABLET | ORAL | Status: DC

## 2014-06-21 ENCOUNTER — Encounter: Payer: Self-pay | Admitting: Family Medicine

## 2014-06-21 ENCOUNTER — Ambulatory Visit (INDEPENDENT_AMBULATORY_CARE_PROVIDER_SITE_OTHER): Payer: BC Managed Care – PPO | Admitting: Family Medicine

## 2014-06-21 ENCOUNTER — Telehealth: Payer: Self-pay | Admitting: Family Medicine

## 2014-06-21 VITALS — BP 138/83 | HR 78 | Temp 98.4°F | Wt 175.2 lb

## 2014-06-21 DIAGNOSIS — L508 Other urticaria: Secondary | ICD-10-CM

## 2014-06-21 MED ORDER — PREDNISONE 10 MG PO TABS: 10.0000 mg | ORAL_TABLET | Freq: Every day | ORAL | Status: DC

## 2014-06-21 MED ORDER — PREDNISONE 10 MG PO TABS
10.0000 mg | ORAL_TABLET | Freq: Every day | ORAL | Status: DC
Start: 2014-06-21 — End: 2014-06-21

## 2014-06-21 NOTE — Telephone Encounter (Signed)
Rx sent to the correct pharmacy.     KP

## 2014-06-21 NOTE — Progress Notes (Signed)
Pre visit review using our clinic review tool, if applicable. No additional management support is needed unless otherwise documented below in the visit note. 

## 2014-06-21 NOTE — Patient Instructions (Signed)
Hives Hives are itchy, red, swollen areas of the skin. They can vary in size and location on your body. Hives can come and go for hours or several days (acute hives) or for several weeks (chronic hives). Hives do not spread from person to person (noncontagious). They may get worse with scratching, exercise, and emotional stress. CAUSES   Allergic reaction to food, additives, or drugs.  Infections, including the common cold.  Illness, such as vasculitis, lupus, or thyroid disease.  Exposure to sunlight, heat, or cold.  Exercise.  Stress.  Contact with chemicals. SYMPTOMS   Red or white swollen patches on the skin. The patches may change size, shape, and location quickly and repeatedly.  Itching.  Swelling of the hands, feet, and face. This may occur if hives develop deeper in the skin. DIAGNOSIS  Your caregiver can usually tell what is wrong by performing a physical exam. Skin or blood tests may also be done to determine the cause of your hives. In some cases, the cause cannot be determined. TREATMENT  Mild cases usually get better with medicines such as antihistamines. Severe cases may require an emergency epinephrine injection. If the cause of your hives is known, treatment includes avoiding that trigger.  HOME CARE INSTRUCTIONS   Avoid causes that trigger your hives.  Take antihistamines as directed by your caregiver to reduce the severity of your hives. Non-sedating or low-sedating antihistamines are usually recommended. Do not drive while taking an antihistamine.  Take any other medicines prescribed for itching as directed by your caregiver.  Wear loose-fitting clothing.  Keep all follow-up appointments as directed by your caregiver. SEEK MEDICAL CARE IF:   You have persistent or severe itching that is not relieved with medicine.  You have painful or swollen joints. SEEK IMMEDIATE MEDICAL CARE IF:   You have a fever.  Your tongue or lips are swollen.  You have  trouble breathing or swallowing.  You feel tightness in the throat or chest.  You have abdominal pain. These problems may be the first sign of a life-threatening allergic reaction. Call your local emergency services (911 in [place] MAKE SURE YOU:   Understand these instructions.  Will watch your condition.  Will get help right away if you are not doing well or get worse. Document Released: 09/17/2005 Document Revised: 09/22/2013 Document Reviewed: 12/11/2011 Exit[employer] Patient Information 2015 [employer]. This information is not intended to replace advice given to you by your health care provider. Make sure you discuss any questions you have with your health care provider.  

## 2014-06-21 NOTE — Telephone Encounter (Signed)
Caller name: Claiborne Billings  Relation to pt: self  Call back number: [phone] [facility] [address], [place], [place] [address]  [phone]   Reason for call:   please send predniSONE (DELTASONE) 10 MG tablet  To [facility] [phone]

## 2014-06-21 NOTE — Progress Notes (Signed)
   Subjective:    Patient ID: Shelby Warren, female    DOB: 01-03-1966, 48 y.o.   MRN: [id number]  HPI Pt here c/o chronic urticaria.  It has come back.  Last episode 08/2012.  Occurs every night and gone by late morning. Taking all meds per Dr young--- see allergy ov.   Urticaria already resolved but was miserable last night.  Started taking 10 mg pred daily.     Review of Systems    as above Objective:   Physical Exam BP 138/83  Pulse 78  Temp(Src) 98.4 F (36.9 C) (Oral)  Wt 175 lb 3.2 oz (79.47 kg)  SpO2 100% General appearance: alert, cooperative, appears stated age and no distress Skin: Skin color, texture, turgor normal. No rashes or lesions        Assessment & Plan:  1. Chronic urticaria con't meds Take pred 10 mg daily until appointment made - Ambulatory referral to Allergy

## 2014-06-24 ENCOUNTER — Ambulatory Visit (INDEPENDENT_AMBULATORY_CARE_PROVIDER_SITE_OTHER): Payer: BC Managed Care – PPO | Admitting: Endocrinology

## 2014-06-24 ENCOUNTER — Encounter: Payer: Self-pay | Admitting: Endocrinology

## 2014-06-24 VITALS — BP 132/88 | HR 77 | Temp 98.1°F | Wt 177.0 lb

## 2014-06-24 DIAGNOSIS — E039 Hypothyroidism, unspecified: Secondary | ICD-10-CM

## 2014-06-24 MED ORDER — SYNTHROID 200 MCG PO TABS: 200.0000 ug | ORAL_TABLET | Freq: Every day | ORAL | Status: DC

## 2014-06-24 NOTE — Progress Notes (Signed)
Patient ID: Shelby Warren, female   DOB: [birth year], 48 y.o.   MRN: [id number]    Reason for Appointment:  Hypothyroidism, new visit    History of Present Illness:   The Hyothyroidism was first diagnosed  Soon after her pregnancy about 17 years ago At that time she was having marked fatigue and weakness is also some difficulty with balance. Does not remember other symptoms Also does not know how low her thyroid levels were She felt better with starting thyroid supplements  For several years the patient had been supplemented with levothyroxine 125 mcg daily She has been followed by her PCP about once a year and has not had much changes made in her dosage until this year Since 11/2013 her dosage has been increased because of relatively high TSH levels More recently the patient has had symptoms of fatigue, some cold sensitivity, mild hair loss and dryness of the hair and skin as well as heavier menstrual cycles. She has not had any significant change in her weight. She does think her voice is somewhat more hoarse  Since her TSH was 13 and her dose was increased from 150 up to 200 mcg daily in early 06/2014 With this her energy level is improved although recently has been somewhat more tired because of difficulty sleeping because of hives  She has been very compliant with taking the medication about 30 minutes before breakfast She is not taking any multivitamins, calcium or iron supplements at this time. Previously was taking chewable calcium but about 2 hours after her breakfast  Because of her difficulty getting consistent control of her thyroid levels she is referred here for further management     Lab Results  Component Value Date   FREET4 1.34 12/06/2011   FREET4 1.2 07/11/2009   TSH 13.18* 05/31/2014   TSH 4.26 04/01/2014   TSH 9.85* 02/15/2014    Past Medical History  Diagnosis Date  . Allergic rhinitis   . Hypothyroidism   . ADD (attention deficit disorder)   . Asthma   . B12  deficiency     Past Surgical History  Procedure Laterality Date  . Anal fissure repair    . Spincture muscle    . Lasic eye surgery      Family History  Problem Relation Age of Onset  . Breast cancer Paternal Grandmother   . Diabetes Maternal Grandfather     Social History:  reports that she has never smoked. She has never used smokeless tobacco. She reports that she drinks alcohol. She reports that she does not use illicit drugs.  Allergies:  Allergies  Allergen Reactions  . Penicillins       Medication List       This list is accurate as of: 06/24/14  9:23 AM.  Always use your most recent med list.               ACZONE 5 % topical gel  Generic drug:  Dapsone     famotidine 40 MG tablet  Commonly known as:  PEPCID  TAKE 1 TABLET BY MOUTH TWICE A DAY     fexofenadine 180 MG tablet  Commonly known as:  ALLEGRA  Take 2 tablets every morning.     hydrOXYzine 10 MG tablet  Commonly known as:  ATARAX/VISTARIL  Take 20 mg by mouth at bedtime.     levothyroxine 200 MCG tablet  Commonly known as:  SYNTHROID, LEVOTHROID  TAKE 1 TABLET BY MOUTH DAILY BEFORE BREAKFAST  methylphenidate 10 MG tablet  Commonly known as:  RITALIN  Take 1 tablet (10 mg total) by mouth 3 (three) times daily.     montelukast 10 MG tablet  Commonly known as:  SINGULAIR  TAKE 1 TABLET BY MOUTH AT BEDTIME     predniSONE 10 MG tablet  Commonly known as:  DELTASONE  Take 1 tablet (10 mg total) by mouth daily with breakfast.     VENTOLIN HFA 108 (90 BASE) MCG/ACT inhaler  Generic drug:  albuterol  INHALE 2 PUFFS BY MOUTH EVERY 4 TO 6 HOURS AS NEEDED     ZYRTEC ALLERGY 10 MG Caps  Generic drug:  Cetirizine HCl  Take 20 mg by mouth daily.        Review of Systems:  She is having significant problem with hives which she thinks is from stress. Takes prednisone  No recent weight change, she was as much as 180 pounds last year  CARDIOLOGY: no history of high blood pressure.             GASTROENTEROLOGY:  no Change in bowel habits.       ENDOCRINOLOGY:  no history of Diabetes.           She takes B12 injections for reportedly B12 deficiency  No tingling or numbness in her hands or feet  No muscle aches or cramps   Examination:    BP 132/88  Pulse 77  Temp(Src) 98.1 F (36.7 C) (Oral)  Wt 177 lb (80.287 kg)  SpO2 99%   General Appearance: pleasant, averagely built and nourished          Eyes: No prominence of the eyes or eyelid swelling.          Neck: The thyroid is nonpalpable. There is no lymphadenopathy .    Cardiovascular: Normal apex and heart sounds, no murmur Respiratory:  Lungs clear Gastrointestinal: No hepatosplenomegaly  Neurological: REFLEXES: at biceps are normal.     Skin: moist, warm, no rashes        Assessment:   Hypothyroidism, autoimmune related, since 1998 Appears to be requiring larger doses of thyroxine supplements this year, likely to be related to progression of her hypothyroidism She does not have any issues with noncompliance with her medication or interacting substances that she is taking; has been taking Pepcid for quite some time She is somewhat symptomatic with her hypothyroidism now although subjectively better with increasing her dose Although her dose has been increased by 50 mcg recently she does not appear to have any signs or symptoms of over replacement However may not need as much as 200 mcg  PLAN:     She will continue 200 mcg of levothyroxine for now but change to brand name Synthroid Check TSH level in about 3 weeks Given information on Hashimoto thyroiditis and co-pay card for brand name Synthroid Discussed autoimmune nature of hypothyroidism Discussed separating her calcium supplement from the Synthroid if she starts back on this  Followup in 3 months    Harland Aguiniga 06/24/2014, 9:23 AM

## 2014-06-24 NOTE — Patient Instructions (Signed)
No change 

## 2014-06-28 ENCOUNTER — Telehealth: Payer: Self-pay | Admitting: Internal Medicine

## 2014-06-28 NOTE — Telephone Encounter (Signed)
LMTCB

## 2014-06-28 NOTE — Telephone Encounter (Signed)
Shelby Warren working on time 

## 2014-06-28 NOTE — Telephone Encounter (Signed)
Per CY-patient last seen 09-23-12 and can be seen on Wednesday 06-30-14 at 9:15am-please do not use the other held slots as those are for a consult. Thanks.

## 2014-06-28 NOTE — Telephone Encounter (Signed)
Per referral from Dr. Etter Sjogren pt needs appt with Dr. Annamaria Boots. Pt last saw Dr. Annamaria Boots 06/21/14. Please advise where pt can be worked in thanks

## 2014-06-29 NOTE — Telephone Encounter (Signed)
Called and spoke to pt. Pt unable to make appt on 06/30/14 but appt was made for 07/01/14. Pt verbalized understanding and denied any further questions or concerns at this time.

## 2014-06-30 ENCOUNTER — Ambulatory Visit (INDEPENDENT_AMBULATORY_CARE_PROVIDER_SITE_OTHER): Payer: BC Managed Care – PPO | Admitting: *Deleted

## 2014-06-30 DIAGNOSIS — E538 Deficiency of other specified B group vitamins: Secondary | ICD-10-CM

## 2014-06-30 MED ORDER — CYANOCOBALAMIN 1000 MCG/ML IJ SOLN
1000.0000 ug | Freq: Once | INTRAMUSCULAR | Status: AC
  Administered 2014-06-30: 1000 ug via INTRAMUSCULAR

## 2014-07-01 ENCOUNTER — Other Ambulatory Visit: Payer: BC Managed Care – PPO

## 2014-07-01 ENCOUNTER — Encounter: Payer: Self-pay | Admitting: Internal Medicine

## 2014-07-01 ENCOUNTER — Ambulatory Visit (INDEPENDENT_AMBULATORY_CARE_PROVIDER_SITE_OTHER): Payer: BC Managed Care – PPO | Admitting: Internal Medicine

## 2014-07-01 VITALS — BP 110/82 | HR 87 | Ht 66.5 in | Wt 178.4 lb

## 2014-07-01 DIAGNOSIS — L508 Other urticaria: Secondary | ICD-10-CM

## 2014-07-01 DIAGNOSIS — L501 Idiopathic urticaria: Secondary | ICD-10-CM

## 2014-07-01 DIAGNOSIS — Z23 Encounter for immunization: Secondary | ICD-10-CM

## 2014-07-01 MED ORDER — DOXEPIN HCL 10 MG PO CAPS: ORAL_CAPSULE | ORAL | Status: DC

## 2014-07-01 NOTE — Progress Notes (Signed)
09/23/12- 48 yo F never smoker referred for allergy evaluation courtesy of Dr Etter Sjogren Ref by Dr. Henrietta Hoover hives reaction unsure of what allergen is, has been occuring 2-2 1/2 months since onset in October of 2013. At that time she was under job stress and she had just changed to omeprazole to Nexium. Hives persisted. Nexium was then changed to Pepcid a.c. The hives seem to resolve until 2 days before this visit. She had allergy evaluation with Dr. Fredderick Phenix in October. She has been taking Allegra 180 mg x2 daily, Pepcid AC, Zyrtec and Singulair. At bedtime she has tried hydroxyzine 20 mg. Her last dose of prednisone was completed 3 weeks ago. Today she woke with nasal congestion but nasal symptoms and chest tightness or wheezing have not really been part of this illness up to now. She has known history of seafood allergy triggering hives and angioedema with asthma 20 years ago. She has not recognized problems from cosmetics, soaps or other exposures but is using hypoallergenic cleansers. Her job for [employer] requires in-state travel, but no suspect exposures. Social alcohol. No recognized environmental exposure triggers. Son gets hives from Z-Pak  Lab- thyroid function, chemistry profile and food IgE profiles were negative. CBC from October 31 showed WBC 10,900 without elevated eosinophils. Seafood allergy panel negative. Gen. [place] allergy IgE panel on 07/18/2012 showed elevation to cat and dog. Food IgG elevation for egg white, milk, peanut and wheat.  07/01/14- 74 yo F never smoker followed for urticaria, allergic rhinitis, complicated by hypothyroid, GERD FOLLOWS FOR: per Dr Etter Sjogren; hives x 2 months-has been on prednisone as well. Hives had stopped after being seen in 2013. Began again in August without definite trigger except she questions role of stress. No angioedema in throat or tongue. Pressure spots such as waistbands raise question of latex exposure. No clear association with heat  or sunlight. Hives completely stopped while she was on prednisone until she tapered to 10 mg which is what she is taking now is daily maintenance. Stopped and prednisone for one day resulted in generalized hives again. She continues to take Allegra in the morning, hydroxyzine at bedtime, Zyrtec at 5 PM, Pepcid 40 mg twice daily and Singulair once daily, in addition to the prednisone. Hypothyroidism is being treated  She remembers Dermographism being demonstrated in the past, but I could not demonstrated with a simple scratch on this visit. She is not aware of any chronic infection such as hepatitis, or inflammatory process. Medications were reviewed. She denies pregnancy or use of female hormones.  ROS-see HPI Constitutional:   No-   weight loss, night sweats, fevers, chills, fatigue, lassitude. HEENT:   No-  headaches, difficulty swallowing, tooth/dental problems, sore throat,       No-  sneezing, itching, ear ache, nasal congestion, post nasal drip,  CV:  No-   chest pain, orthopnea, PND, swelling in lower extremities, anasarca, dizziness, palpitations Resp: No-   shortness of breath with exertion or at rest.              No-   productive cough,  No non-productive cough,  No- coughing up of blood.              No-   change in color of mucus.  No- wheezing.   Skin: +HPI GI:  No-   heartburn, indigestion, abdominal pain, nausea, vomiting, GU:  MS:  No-   joint pain or swelling.   Neuro-     nothing unusual Psych:  No-  change in mood or affect. No depression or anxiety(+"stress").  No memory loss.  OBJ- Physical Exam General- Alert, Oriented, Affect-appropriate, Distress- none acute. Appears well Skin- very minor residual hives on arm. +No dermographism Lymphadenopathy- none Head- atraumatic            Eyes- Gross vision intact, PERRLA, conjunctivae and secretions clear            Ears- Hearing, canals-normal            Nose- Clear, no-Septal dev, mucus, polyps, erosion, perforation              Throat- Mallampati II , mucosa clear , drainage- none, tonsils- atrophic Neck- flexible , trachea midline, no stridor , thyroid nl, carotid no bruit Chest - symmetrical excursion , unlabored           Heart/CV- RRR , no murmur , no gallop  , no rub, nl s1 s2                           - JVD- none , edema- none, stasis changes- none, varices- none           Lung- clear to P&A, wheeze- none, cough- none , dullness-none, rub- none           Chest wall-  Abd-  Br/ Gen/ Rectal- Not done, not indicated Extrem- cyanosis- none, clubbing, none, atrophy- none, strength- nl Neuro- grossly intact to observation

## 2014-07-01 NOTE — Patient Instructions (Addendum)
Script sent for doxepin 10 mg, 1-3 at bedtime if needed              Stop the prednisone when you start doxepin, then gradually taper off the other antihistamines as you feel able  Order- lab- latex IgE    Dx Urticaria, chronic

## 2014-07-02 LAB — LATEX, IGE: Latex: <0.1 kU/L

## 2014-07-03 NOTE — Assessment & Plan Note (Signed)
Stress may be the trigger as she suspects although it is hard to control. We discussed Xolair as an option now. We are going to try first with doxepin which is a very strong antihistamine as well as an antidepressant, as discussed. Plan-doxepin

## 2014-07-05 ENCOUNTER — Other Ambulatory Visit: Payer: Self-pay

## 2014-07-05 MED ORDER — MONTELUKAST SODIUM 10 MG PO TABS: ORAL_TABLET | ORAL | Status: DC

## 2014-07-08 ENCOUNTER — Telehealth: Payer: Self-pay

## 2014-07-08 DIAGNOSIS — F988 Other specified behavioral and emotional disorders with onset usually occurring in childhood and adolescence: Secondary | ICD-10-CM

## 2014-07-08 NOTE — Telephone Encounter (Signed)
I will likely write the prescription but I need to know what med?

## 2014-07-08 NOTE — Telephone Encounter (Signed)
OK to print a one month supply of he Ritalin at same strength and same sig

## 2014-07-08 NOTE — Telephone Encounter (Signed)
Ritalin please. Thanks   KP

## 2014-07-08 NOTE — Telephone Encounter (Signed)
Last seen 06/21/14 and filled 06/08/14 #90. UDS 02/26/13 Low Risk   Please advise     KP

## 2014-07-09 MED ORDER — METHYLPHENIDATE HCL 10 MG PO TABS: 10.0000 mg | ORAL_TABLET | Freq: Three times a day (TID) | ORAL | Status: DC

## 2014-07-09 NOTE — Telephone Encounter (Signed)
VM not setup, unable to leave a message.      KP

## 2014-07-15 ENCOUNTER — Other Ambulatory Visit: Payer: BC Managed Care – PPO

## 2014-07-19 ENCOUNTER — Other Ambulatory Visit: Payer: Self-pay

## 2014-07-19 MED ORDER — FAMOTIDINE 40 MG PO TABS: ORAL_TABLET | ORAL | Status: DC

## 2014-07-22 ENCOUNTER — Other Ambulatory Visit (INDEPENDENT_AMBULATORY_CARE_PROVIDER_SITE_OTHER): Payer: BC Managed Care – PPO

## 2014-07-22 ENCOUNTER — Ambulatory Visit: Payer: BC Managed Care – PPO

## 2014-07-22 DIAGNOSIS — E039 Hypothyroidism, unspecified: Secondary | ICD-10-CM

## 2014-07-22 LAB — TSH: TSH: 4.18 u[IU]/mL (ref 0.35–4.50)

## 2014-07-26 NOTE — Progress Notes (Signed)
Quick Note:  Please let patient know that the thyroid result is normal and to continue same dose, will recheck in December at her scheduled appointment ______

## 2014-07-30 ENCOUNTER — Ambulatory Visit (INDEPENDENT_AMBULATORY_CARE_PROVIDER_SITE_OTHER): Payer: BC Managed Care – PPO | Admitting: *Deleted

## 2014-07-30 ENCOUNTER — Encounter: Payer: Self-pay | Admitting: *Deleted

## 2014-07-30 DIAGNOSIS — E538 Deficiency of other specified B group vitamins: Secondary | ICD-10-CM

## 2014-07-30 MED ORDER — CYANOCOBALAMIN 1000 MCG/ML IJ SOLN
1000.0000 ug | Freq: Once | INTRAMUSCULAR | Status: AC
  Administered 2014-07-30: 1000 ug via INTRAMUSCULAR

## 2014-08-06 ENCOUNTER — Other Ambulatory Visit: Payer: Self-pay

## 2014-08-06 MED ORDER — ALBUTEROL SULFATE HFA 108 (90 BASE) MCG/ACT IN AERS: INHALATION_SPRAY | RESPIRATORY_TRACT | Status: DC

## 2014-08-09 ENCOUNTER — Other Ambulatory Visit: Payer: Self-pay

## 2014-08-09 DIAGNOSIS — F988 Other specified behavioral and emotional disorders with onset usually occurring in childhood and adolescence: Secondary | ICD-10-CM

## 2014-08-09 MED ORDER — METHYLPHENIDATE HCL 10 MG PO TABS: 10.0000 mg | ORAL_TABLET | Freq: Three times a day (TID) | ORAL | Status: DC

## 2014-08-12 ENCOUNTER — Encounter: Payer: Self-pay | Admitting: Internal Medicine

## 2014-08-12 ENCOUNTER — Ambulatory Visit (INDEPENDENT_AMBULATORY_CARE_PROVIDER_SITE_OTHER): Payer: BC Managed Care – PPO | Admitting: Internal Medicine

## 2014-08-12 VITALS — BP 115/68 | HR 83 | Ht 67.0 in | Wt 184.0 lb

## 2014-08-12 DIAGNOSIS — J302 Other seasonal allergic rhinitis: Secondary | ICD-10-CM

## 2014-08-12 DIAGNOSIS — J309 Allergic rhinitis, unspecified: Secondary | ICD-10-CM

## 2014-08-12 DIAGNOSIS — L501 Idiopathic urticaria: Secondary | ICD-10-CM

## 2014-08-12 DIAGNOSIS — J3089 Other allergic rhinitis: Secondary | ICD-10-CM

## 2014-08-12 MED ORDER — DOXEPIN HCL 10 MG PO CAPS: ORAL_CAPSULE | ORAL | Status: DC

## 2014-08-12 NOTE — Assessment & Plan Note (Signed)
Well-controlled taking high doses of antihistamines.

## 2014-08-12 NOTE — Assessment & Plan Note (Signed)
Significantly less urticaria. Presumably improvement due to high doses of antihistamines including doxepin. She had felt stress was a trigger for her hives and doxepin might also address that. Medication is well tolerated at this dose. We have discussed alternatives. Plan-she will gradually taper off of her additional antihistamines as tolerated and use the lowest dose of doxepin necessary.

## 2014-08-12 NOTE — Patient Instructions (Signed)
Ok to continue Doxepin, up to 3 daily as needed. If stable, you can gradually come off the other antihistamines.  Please call as needed

## 2014-08-12 NOTE — Progress Notes (Signed)
09/23/12- 48 yo F never smoker referred for allergy evaluation courtesy of Dr Etter Sjogren Ref by Dr. Henrietta Hoover hives reaction unsure of what allergen is, has been occuring 2-2 1/2 months since onset in October of 2013. At that time she was under job stress and she had just changed to omeprazole to Nexium. Hives persisted. Nexium was then changed to Pepcid a.c. The hives seem to resolve until 2 days before this visit. She had allergy evaluation with Dr. Fredderick Phenix in October. She has been taking Allegra 180 mg x2 daily, Pepcid AC, Zyrtec and Singulair. At bedtime she has tried hydroxyzine 20 mg. Her last dose of prednisone was completed 3 weeks ago. Today she woke with nasal congestion but nasal symptoms and chest tightness or wheezing have not really been part of this illness up to now. She has known history of seafood allergy triggering hives and angioedema with asthma 20 years ago. She has not recognized problems from cosmetics, soaps or other exposures but is using hypoallergenic cleansers. Her job for [employer] requires in-state travel, but no suspect exposures. Social alcohol. No recognized environmental exposure triggers. Son gets hives from Z-Pak  Lab- thyroid function, chemistry profile and food IgE profiles were negative. CBC from October 31 showed WBC 10,900 without elevated eosinophils. Seafood allergy panel negative. Gen. [place] allergy IgE panel on 07/18/2012 showed elevation to cat and dog. Food IgG elevation for egg white, milk, peanut and wheat.  07/01/14- 49 yo F never smoker followed for urticaria, allergic rhinitis, complicated by hypothyroid, GERD FOLLOWS FOR: per Dr Etter Sjogren; hives x 2 months-has been on prednisone as well. Hives had stopped after being seen in 2013. Began again in August without definite trigger except she questions role of stress. No angioedema in throat or tongue. Pressure spots such as waistbands raise question of latex exposure. No clear association with heat  or sunlight. Hives completely stopped while she was on prednisone until she tapered to 10 mg which is what she is taking now is daily maintenance. Stopped and prednisone for one day resulted in generalized hives again. She continues to take Allegra in the morning, hydroxyzine at bedtime, Zyrtec at 5 PM, Pepcid 40 mg twice daily and Singulair once daily, in addition to the prednisone. Hypothyroidism is being treated  She remembers Dermographism being demonstrated in the past, but I could not demonstrated with a simple scratch on this visit. She is not aware of any chronic infection such as hepatitis, or inflammatory process. Medications were reviewed. She denies pregnancy or use of female hormones.  08/12/14-  84 yo F never smoker followed for urticaria, allergic rhinitis, complicated by hypothyroid, GERD FOLLOW FOR:  Hives; has not had any hives this week.  no complaints She credits better control of hives to use of doxepin, taking 310 mg daily. She has been able to cut down Allegra and Zyrtec and has stopped hydroxyzine. She denies significant daytime sleepiness but is on long-term Ritalin for ADD. We discussed anxiety also benefiting from doxepin. We discussed Xolair as an alternative.  ROS-see HPI Constitutional:   No-   weight loss, night sweats, fevers, chills, fatigue, lassitude. HEENT:   No-  headaches, difficulty swallowing, tooth/dental problems, sore throat,       No-  sneezing, itching, ear ache, nasal congestion, post nasal drip,  CV:  No-   chest pain, orthopnea, PND, swelling in lower extremities, anasarca, dizziness, palpitations Resp: No-   shortness of breath with exertion or at rest.  No-   productive cough,  No non-productive cough,  No- coughing up of blood.              No-   change in color of mucus.  No- wheezing.   Skin: +HPI GI:  No-   heartburn, indigestion, abdominal pain, nausea, vomiting, GU:  MS:  No-   joint pain or swelling.   Neuro-     nothing  unusual Psych:  No- change in mood or affect. No depression or anxiety(+"stress").  No memory loss.  OBJ- Physical Exam General- Alert, Oriented, Affect-appropriate, Distress- none acute. Appears well Skin- clear Lymphadenopathy- none Head- atraumatic            Eyes- Gross vision intact, PERRLA, conjunctivae and secretions clear            Ears- Hearing, canals-normal            Nose- Clear, no-Septal dev, mucus, polyps, erosion, perforation             Throat- Mallampati II , mucosa clear , drainage- none, tonsils- atrophic Neck- flexible , trachea midline, no stridor , thyroid nl, carotid no bruit Chest - symmetrical excursion , unlabored           Heart/CV- RRR , no murmur , no gallop  , no rub, nl s1 s2                           - JVD- none , edema- none, stasis changes- none, varices- none           Lung- clear to P&A, wheeze- none, cough- none , dullness-none, rub- none           Chest wall-  Abd-  Br/ Gen/ Rectal- Not done, not indicated Extrem- cyanosis- none, clubbing, none, atrophy- none, strength- nl Neuro- grossly intact to observation

## 2014-08-23 ENCOUNTER — Encounter: Payer: Self-pay | Admitting: Family Medicine

## 2014-08-30 ENCOUNTER — Ambulatory Visit (INDEPENDENT_AMBULATORY_CARE_PROVIDER_SITE_OTHER): Payer: BC Managed Care – PPO

## 2014-08-30 DIAGNOSIS — E538 Deficiency of other specified B group vitamins: Secondary | ICD-10-CM

## 2014-08-30 DIAGNOSIS — R7989 Other specified abnormal findings of blood chemistry: Secondary | ICD-10-CM

## 2014-08-30 MED ORDER — CYANOCOBALAMIN 1000 MCG/ML IJ SOLN
1000.0000 ug | Freq: Once | INTRAMUSCULAR | Status: AC
  Administered 2014-08-30: 1000 ug via INTRAMUSCULAR

## 2014-08-30 NOTE — Progress Notes (Signed)
Pre visit review using our clinic review tool, if applicable. No additional management support is needed unless otherwise documented below in the visit note.  Patient tolerated injection well.  

## 2014-09-07 ENCOUNTER — Other Ambulatory Visit: Payer: Self-pay

## 2014-09-07 DIAGNOSIS — F988 Other specified behavioral and emotional disorders with onset usually occurring in childhood and adolescence: Secondary | ICD-10-CM

## 2014-09-07 MED ORDER — METHYLPHENIDATE HCL 10 MG PO TABS: 10.0000 mg | ORAL_TABLET | Freq: Three times a day (TID) | ORAL | Status: DC

## 2014-09-15 ENCOUNTER — Other Ambulatory Visit: Payer: Self-pay | Admitting: Endocrinology

## 2014-09-20 ENCOUNTER — Ambulatory Visit (INDEPENDENT_AMBULATORY_CARE_PROVIDER_SITE_OTHER): Payer: BC Managed Care – PPO | Admitting: Endocrinology

## 2014-09-20 ENCOUNTER — Other Ambulatory Visit: Payer: Self-pay | Admitting: Endocrinology

## 2014-09-20 ENCOUNTER — Encounter: Payer: Self-pay | Admitting: Endocrinology

## 2014-09-20 VITALS — BP 118/83 | HR 75 | Temp 97.9°F | Resp 14 | Ht 67.0 in | Wt 185.6 lb

## 2014-09-20 DIAGNOSIS — E039 Hypothyroidism, unspecified: Secondary | ICD-10-CM

## 2014-09-20 NOTE — Progress Notes (Signed)
Patient ID: Shelby Warren, female   DOB: June 01, 1966, 48 y.o.   MRN: [id number]    Reason for Appointment:  Hypothyroidism, follow-up visit    History of Present Illness:    HYPOTHYROIDISM  was first diagnosed  Soon after her pregnancy about 17 years ago At that time she was having marked fatigue and weakness along with some difficulty with balance Also does not know how low her thyroid levels were initially but she felt better with starting thyroid supplements  For several years the patient had been supplemented with levothyroxine 125 mcg daily She has been followed by her PCP about once a year and has not had much changes made in her dosage until this year Since 11/2013 her dosage has been increased because of relatively high TSH levels Since her TSH was 13 and she was symptomatic with fatigue, some cold sensitivity, mild hair loss and dry skin Her dose was increased from 150 up to 200 mcg daily in early 06/2014  Subsequently TSH was normal in 10/15 More recently the patient has had occasional symptoms of fatigue, some cold sensitivity and a little dry hair  She continues to have heavier menstrual cycles.   She has not had any significant change in her weight.    She has been very compliant with taking the medication about 30 minutes before breakfast She is not taking any multivitamins or iron supplements at this time.  She is taking chewable calcium but about 2 hours after her breakfast      Lab Results  Component Value Date   FREET4 1.34 12/06/2011   FREET4 1.2 07/11/2009   TSH 4.18 07/22/2014   TSH 13.18* 05/31/2014   TSH 4.26 04/01/2014    Past Medical History  Diagnosis Date  . Allergic rhinitis   . Hypothyroidism   . ADD (attention deficit disorder)   . Asthma   . B12 deficiency     Past Surgical History  Procedure Laterality Date  . Anal fissure repair    . Spincture muscle    . Lasic eye surgery      Family History  Problem Relation Age of Onset  .  Breast cancer Paternal Grandmother   . Diabetes Maternal Grandfather   . Thyroid disease Neg Hx     Social History:  reports that she has never smoked. She has never used smokeless tobacco. She reports that she drinks alcohol. She reports that she does not use illicit drugs.  Allergies:  Allergies  Allergen Reactions  . Penicillins       Medication List       This list is accurate as of: 09/20/14 10:41 AM.  Always use your most recent med list.               ACZONE 5 % topical gel  Generic drug:  Dapsone     albuterol 108 (90 BASE) MCG/ACT inhaler  Commonly known as:  VENTOLIN HFA  INHALE 2 PUFFS BY MOUTH EVERY 4 TO 6 HOURS AS NEEDED     doxepin 10 MG capsule  Commonly known as:  SINEQUAN  1-3 at bedtime as needed for hives     famotidine 40 MG tablet  Commonly known as:  PEPCID  TAKE 1 TABLET BY MOUTH TWICE A DAY     fexofenadine 180 MG tablet  Commonly known as:  ALLEGRA  Take 180 mg by mouth daily.     hydrOXYzine 10 MG tablet  Commonly known as:  ATARAX/VISTARIL  Take 20  mg by mouth at bedtime.     methylphenidate 10 MG tablet  Commonly known as:  RITALIN  Take 1 tablet (10 mg total) by mouth 3 (three) times daily.     montelukast 10 MG tablet  Commonly known as:  SINGULAIR  TAKE 1 TABLET BY MOUTH AT BEDTIME     SYNTHROID 200 MCG tablet  Generic drug:  levothyroxine  TAKE 1 TABLET BY MOUTH EVERY DAY BEFORE BREAKFAST     ZYRTEC ALLERGY 10 MG Caps  Generic drug:  Cetirizine HCl  Take 20 mg by mouth daily.        Review of Systems:  She is having significant problem with hives which she thinks is from stress. Takes prednisone         She takes B12 injections for reportedly B12 deficiency and she feels better with this    Examination:    BP 118/83 mmHg  Pulse 75  Temp(Src) 97.9 F (36.6 C)  Resp 14  Ht 5\' 7"  (1.702 m)  Wt 185 lb 9.6 oz (84.188 kg)  BMI 29.06 kg/m2  SpO2 97%   General Appearance: pleasant, looks well    Eyes: No  prominence of the eyes or eyelid swelling.          Neck: The thyroid is nonpalpable.    Neurological: REFLEXES: at biceps are normal.       Assessment:   Hypothyroidism, primary, since 1998 She is requiring larger doses of thyroxine supplements this year, currently on 200 g which is more than expected for her weight She does not have any issues with noncompliance with her medication or interacting substances that she is taking She has only mild nonspecific intermittent fatigue at this time and looks euthyroid TSH is back to normal as of 10/15     PLAN:    Check TSH level today  Followup in 6 months if no changes made    [facility] 09/20/2014, 10:41 AM

## 2014-09-21 LAB — TSH: TSH: 1.063 u[IU]/mL (ref 0.350–4.500)

## 2014-09-28 ENCOUNTER — Other Ambulatory Visit: Payer: BC Managed Care – PPO

## 2014-09-28 ENCOUNTER — Telehealth: Payer: Self-pay | Admitting: Endocrinology

## 2014-09-28 ENCOUNTER — Other Ambulatory Visit: Payer: Self-pay | Admitting: Family Medicine

## 2014-09-28 DIAGNOSIS — E039 Hypothyroidism, unspecified: Secondary | ICD-10-CM

## 2014-09-28 NOTE — Telephone Encounter (Signed)
FYI--please advise. 

## 2014-09-28 NOTE — Telephone Encounter (Signed)
Very unlikely that the thyroid levels will change in 10 days.  She needs to Talk to Dr. Etter Sjogren First

## 2014-09-28 NOTE — Telephone Encounter (Signed)
Noted, left message on patients vm

## 2014-09-28 NOTE — Telephone Encounter (Signed)
Pt would like to have labs done at Dr. Nonda Lou tomorrow for thyroid she feels like her symptoms have increased.

## 2014-09-29 ENCOUNTER — Ambulatory Visit (INDEPENDENT_AMBULATORY_CARE_PROVIDER_SITE_OTHER): Payer: BC Managed Care – PPO

## 2014-09-29 ENCOUNTER — Other Ambulatory Visit (INDEPENDENT_AMBULATORY_CARE_PROVIDER_SITE_OTHER): Payer: BC Managed Care – PPO

## 2014-09-29 DIAGNOSIS — E538 Deficiency of other specified B group vitamins: Secondary | ICD-10-CM

## 2014-09-29 DIAGNOSIS — E039 Hypothyroidism, unspecified: Secondary | ICD-10-CM

## 2014-09-29 LAB — T4, FREE: FREE T4: 1.09 ng/dL (ref 0.60–1.60)

## 2014-09-29 LAB — TSH: TSH: 0.64 u[IU]/mL (ref 0.35–4.50)

## 2014-09-29 LAB — T3, FREE: T3, Free: 3.2 pg/mL (ref 2.3–4.2)

## 2014-09-29 MED ORDER — CYANOCOBALAMIN 1000 MCG/ML IJ SOLN
1000.0000 ug | Freq: Once | INTRAMUSCULAR | Status: AC
  Administered 2014-09-29: 1000 ug via INTRAMUSCULAR

## 2014-09-29 NOTE — Progress Notes (Signed)
Pre visit review using our clinic review tool, if applicable. No additional management support is needed unless otherwise documented below in the visit note. 

## 2014-09-29 NOTE — Progress Notes (Signed)
Pt tolerated injection well

## 2014-10-07 ENCOUNTER — Other Ambulatory Visit: Payer: Self-pay

## 2014-10-07 DIAGNOSIS — F988 Other specified behavioral and emotional disorders with onset usually occurring in childhood and adolescence: Secondary | ICD-10-CM

## 2014-10-07 MED ORDER — METHYLPHENIDATE HCL 10 MG PO TABS: 10.0000 mg | ORAL_TABLET | Freq: Three times a day (TID) | ORAL | Status: DC

## 2014-10-29 ENCOUNTER — Ambulatory Visit (INDEPENDENT_AMBULATORY_CARE_PROVIDER_SITE_OTHER): Payer: BLUE CROSS/BLUE SHIELD | Admitting: *Deleted

## 2014-10-29 DIAGNOSIS — E538 Deficiency of other specified B group vitamins: Secondary | ICD-10-CM

## 2014-10-29 MED ORDER — CYANOCOBALAMIN 1000 MCG/ML IJ SOLN
1000.0000 ug | Freq: Once | INTRAMUSCULAR | Status: AC
  Administered 2014-10-29: 1000 ug via INTRAMUSCULAR

## 2014-10-29 NOTE — Progress Notes (Signed)
Pre visit review using our clinic review tool, if applicable. No additional management support is needed unless otherwise documented below in the visit note.  Patient tolerated injection well.  Next appointment scheduled for 11/26/14.

## 2014-11-08 ENCOUNTER — Other Ambulatory Visit: Payer: Self-pay

## 2014-11-08 DIAGNOSIS — F988 Other specified behavioral and emotional disorders with onset usually occurring in childhood and adolescence: Secondary | ICD-10-CM

## 2014-11-08 MED ORDER — METHYLPHENIDATE HCL 10 MG PO TABS: 10.0000 mg | ORAL_TABLET | Freq: Three times a day (TID) | ORAL | Status: DC

## 2014-11-26 ENCOUNTER — Ambulatory Visit (INDEPENDENT_AMBULATORY_CARE_PROVIDER_SITE_OTHER): Payer: BLUE CROSS/BLUE SHIELD

## 2014-11-26 DIAGNOSIS — E538 Deficiency of other specified B group vitamins: Secondary | ICD-10-CM

## 2014-11-26 MED ORDER — CYANOCOBALAMIN 1000 MCG/ML IJ SOLN
1000.0000 ug | Freq: Once | INTRAMUSCULAR | Status: AC
  Administered 2014-11-26: 1000 ug via INTRAMUSCULAR

## 2014-11-26 NOTE — Progress Notes (Signed)
Pre visit review using our clinic review tool, if applicable. No additional management support is needed unless otherwise documented below in the visit note. 

## 2014-12-09 ENCOUNTER — Other Ambulatory Visit: Payer: Self-pay | Admitting: Endocrinology

## 2014-12-13 ENCOUNTER — Other Ambulatory Visit: Payer: Self-pay

## 2014-12-13 DIAGNOSIS — F988 Other specified behavioral and emotional disorders with onset usually occurring in childhood and adolescence: Secondary | ICD-10-CM

## 2014-12-13 MED ORDER — METHYLPHENIDATE HCL 10 MG PO TABS: 10.0000 mg | ORAL_TABLET | Freq: Three times a day (TID) | ORAL | Status: DC

## 2014-12-28 ENCOUNTER — Ambulatory Visit: Payer: BLUE CROSS/BLUE SHIELD

## 2015-01-07 ENCOUNTER — Encounter: Payer: Self-pay | Admitting: Family Medicine

## 2015-01-07 ENCOUNTER — Ambulatory Visit (INDEPENDENT_AMBULATORY_CARE_PROVIDER_SITE_OTHER): Payer: BLUE CROSS/BLUE SHIELD | Admitting: Family Medicine

## 2015-01-07 VITALS — BP 112/80 | HR 87 | Temp 98.3°F | Wt 186.8 lb

## 2015-01-07 DIAGNOSIS — F988 Other specified behavioral and emotional disorders with onset usually occurring in childhood and adolescence: Secondary | ICD-10-CM

## 2015-01-07 DIAGNOSIS — E538 Deficiency of other specified B group vitamins: Secondary | ICD-10-CM | POA: Diagnosis not present

## 2015-01-07 DIAGNOSIS — F909 Attention-deficit hyperactivity disorder, unspecified type: Secondary | ICD-10-CM

## 2015-01-07 LAB — VITAMIN B12: Vitamin B-12: 399 pg/mL (ref 211–911)

## 2015-01-07 MED ORDER — METHYLPHENIDATE HCL 10 MG PO TABS: 10.0000 mg | ORAL_TABLET | Freq: Three times a day (TID) | ORAL | Status: DC

## 2015-01-07 NOTE — Progress Notes (Signed)
Patient ID: Shelby Warren, female    DOB: November 25, 1965  Age: 49 y.o. MRN: [id number]    Subjective:  Subjective HPI Shelby Warren presents for f/u add and c/o fatigue --her gyn told her to stop the b12.    Review of Systems  Constitutional: Positive for fatigue. Negative for diaphoresis, activity change, appetite change and unexpected weight change.  Eyes: Negative for pain, redness and visual disturbance.  Respiratory: Negative for cough, chest tightness, shortness of breath and wheezing.   Cardiovascular: Negative for chest pain, palpitations and leg swelling.  Endocrine: Negative for cold intolerance, heat intolerance, polydipsia, polyphagia and polyuria.  Genitourinary: Negative for dysuria, frequency and difficulty urinating.  Neurological: Negative for dizziness, light-headedness, numbness and headaches.  Psychiatric/Behavioral: Negative for behavioral problems and dysphoric mood. The patient is not nervous/anxious.     History Past Medical History  Diagnosis Date  . Allergic rhinitis   . Hypothyroidism   . ADD (attention deficit disorder)   . Asthma   . B12 deficiency     She has past surgical history that includes Anal fissure repair; spincture muscle; and lasic eye surgery.   Her family history includes Breast cancer in her paternal grandmother; Diabetes in her maternal grandfather; Thyroid disease in her paternal grandmother.She reports that she has never smoked. She has never used smokeless tobacco. She reports that she drinks alcohol. She reports that she does not use illicit drugs.  Current Outpatient Prescriptions on File Prior to Visit  Medication Sig Dispense Refill  . ACZONE 5 % topical gel     . albuterol (VENTOLIN HFA) 108 (90 BASE) MCG/ACT inhaler INHALE 2 PUFFS BY MOUTH EVERY 4 TO 6 HOURS AS NEEDED 18 each 1  . doxepin (SINEQUAN) 10 MG capsule 1-3 at bedtime as needed for hives 90 capsule 5  . famotidine (PEPCID) 40 MG tablet TAKE 1 TABLET BY MOUTH TWICE A DAY  60 tablet 5  . fexofenadine (ALLEGRA) 180 MG tablet Take 180 mg by mouth daily.    . montelukast (SINGULAIR) 10 MG tablet TAKE 1 TABLET BY MOUTH AT BEDTIME 30 tablet 11  . SYNTHROID 200 MCG tablet TAKE 1 TABLET BY MOUTH EVERY DAY BEFORE BREAKFAST 30 tablet 2   No current facility-administered medications on file prior to visit.     Objective:  Objective Physical Exam  Constitutional: She is oriented to person, place, and time. She appears well-developed and well-nourished. No distress.  HENT:  Right Ear: External ear normal.  Left Ear: External ear normal.  Nose: Nose normal.  Mouth/Throat: Oropharynx is clear and moist.  Eyes: EOM are normal. Pupils are equal, round, and reactive to light.  Neck: Normal range of motion. Neck supple.  Cardiovascular: Normal rate, regular rhythm and normal heart sounds.   No murmur heard. Pulmonary/Chest: Effort normal and breath sounds normal. No respiratory distress. She has no wheezes. She has no rales. She exhibits no tenderness.  Neurological: She is alert and oriented to person, place, and time.  Psychiatric: She has a normal mood and affect. Her behavior is normal. Judgment and thought content normal.   BP 112/80 mmHg  Pulse 87  Temp(Src) 98.3 F (36.8 C) (Oral)  Wt 186 lb 12.8 oz (84.732 kg)  SpO2 97%  LMP 01/02/2015 Wt Readings from Last 3 Encounters:  01/07/15 186 lb 12.8 oz (84.732 kg)  09/20/14 185 lb 9.6 oz (84.188 kg)  08/12/14 184 lb (83.462 kg)     Lab Results  Component Value Date   WBC  5.1 08/08/2012   HGB 13.1 08/08/2012   HCT 39.3 08/08/2012   PLT 257.0 08/08/2012   GLUCOSE 86 07/04/2012   CHOL 200 07/04/2012   TRIG 77.0 07/04/2012   HDL 61.30 07/04/2012   LDLCALC 123* 07/04/2012   ALT 15 07/04/2012   AST 17 07/04/2012   NA 137 07/04/2012   K 3.8 07/04/2012   CL 105 07/04/2012   CREATININE 0.8 07/04/2012   BUN 13 07/04/2012   CO2 25 07/04/2012   TSH 0.64 09/29/2014    Ct Abdomen Pelvis Wo  Contrast  10/25/2011   *RADIOLOGY REPORT*  Clinical Data: Back pain, hematuria  CT ABDOMEN AND PELVIS WITHOUT CONTRAST  Technique:  Multidetector CT imaging of the abdomen and pelvis was performed following the standard protocol without intravenous contrast.  Comparison: None.  Findings: Lung bases are clear.  Unenhanced liver, spleen, pancreas, and adrenal glands within normal limits.  Gallbladder is contracted.  No intrahepatic or extrahepatic ductal dilatation.  Kidneys are within normal limits.  No renal calculi or hydronephrosis.  No evidence of bowel obstruction.  Normal appendix.  No evidence of abdominal aortic aneurysm.  Small volume pelvic ascites, likely physiologic.  No suspicious abdominopelvic lymphadenopathy.  Uterus and bilateral ovaries are unremarkable.  No ureteral or bladder calculi.  Calcified right pelvic phleboliths.  Visualized osseous structures are within normal limits.  IMPRESSION: No renal, ureteral, or bladder calculi.  No hydronephrosis.  No evidence of bowel obstruction.  Normal appendix.  No CT findings to account for the patient's abdominal pain.  Original Report Authenticated By: Julian Hy, M.D.    Assessment & Plan:  Plan I have discontinued [name] Cetirizine HCl and hydrOXYzine. I am also having her start on methylphenidate and methylphenidate. Additionally, I am having her maintain her ACZONE, montelukast, famotidine, albuterol, fexofenadine, doxepin, SYNTHROID, and methylphenidate.  Meds ordered this encounter  Medications  . methylphenidate (RITALIN) 10 MG tablet    Sig: Take 1 tablet (10 mg total) by mouth 3 (three) times daily.    Dispense:  90 tablet    Refill:  0  . methylphenidate (RITALIN) 10 MG tablet    Sig: Take 1 tablet (10 mg total) by mouth 3 (three) times daily with meals.    Dispense:  90 tablet    Refill:  0    Do not fill until Feb 08, 2015  . methylphenidate (RITALIN) 10 MG tablet    Sig: Take 1 tablet (10 mg total) by mouth 3  (three) times daily with meals.    Dispense:  90 tablet    Refill:  0    Do not fill until March 09, 2015    Problem List Items Addressed This Visit    B12 deficiency - Primary   Relevant Orders   Vitamin B12    Other Visit Diagnoses    ADD (attention deficit disorder)        Relevant Medications    methylphenidate (RITALIN) tablet    methylphenidate (RITALIN) tablet    methylphenidate (RITALIN) tablet       Follow-up: Return in about 6 months (around 07/09/2015), or if symptoms worsen or fail to improve, for f/u.  Garnet Koyanagi, DO

## 2015-01-07 NOTE — Progress Notes (Signed)
Pre visit review using our clinic review tool, if applicable. No additional management support is needed unless otherwise documented below in the visit note. 

## 2015-01-07 NOTE — Patient Instructions (Signed)

## 2015-01-09 ENCOUNTER — Other Ambulatory Visit: Payer: Self-pay | Admitting: Family Medicine

## 2015-02-08 ENCOUNTER — Telehealth: Payer: Self-pay

## 2015-02-08 DIAGNOSIS — F988 Other specified behavioral and emotional disorders with onset usually occurring in childhood and adolescence: Secondary | ICD-10-CM

## 2015-02-08 NOTE — Telephone Encounter (Signed)
Detailed message left advising Shelby Warren, Shelby Warren and Shelby Warren's have been given at the last OV.     KP

## 2015-02-08 NOTE — Telephone Encounter (Signed)
 -----   Message -----     From: Lindon Romp     Sent: 02/08/2015  1:11 PM      To: [facility]    Subject: RE: Medication Renewal Request                Please write Rx for Ritalin, I will p/u when ready. Thanks!

## 2015-02-10 ENCOUNTER — Ambulatory Visit (INDEPENDENT_AMBULATORY_CARE_PROVIDER_SITE_OTHER): Payer: BLUE CROSS/BLUE SHIELD | Admitting: Internal Medicine

## 2015-02-10 ENCOUNTER — Encounter: Payer: Self-pay | Admitting: Internal Medicine

## 2015-02-10 VITALS — BP 122/66 | HR 73 | Ht 67.0 in | Wt 187.8 lb

## 2015-02-10 DIAGNOSIS — L501 Idiopathic urticaria: Secondary | ICD-10-CM | POA: Diagnosis not present

## 2015-02-10 DIAGNOSIS — F419 Anxiety disorder, unspecified: Secondary | ICD-10-CM

## 2015-02-10 NOTE — Progress Notes (Signed)
09/23/12- 49 yo F never smoker referred for allergy evaluation courtesy of Dr Etter Sjogren Ref by Dr. Henrietta Hoover hives reaction unsure of what allergen is, has been occuring 2-2 1/2 months since onset in October of 2013. At that time she was under job stress and she had just changed to omeprazole to Nexium. Hives persisted. Nexium was then changed to Pepcid a.c. The hives seem to resolve until 2 days before this visit. She had allergy evaluation with Dr. Fredderick Phenix in October. She has been taking Allegra 180 mg x2 daily, Pepcid AC, Zyrtec and Singulair. At bedtime she has tried hydroxyzine 20 mg. Her last dose of prednisone was completed 3 weeks ago. Today she woke with nasal congestion but nasal symptoms and chest tightness or wheezing have not really been part of this illness up to now. She has known history of seafood allergy triggering hives and angioedema with asthma 20 years ago. She has not recognized problems from cosmetics, soaps or other exposures but is using hypoallergenic cleansers. Her job for [employer] requires in-state travel, but no suspect exposures. Social alcohol. No recognized environmental exposure triggers. Son gets hives from [employer]-Pak  Lab- thyroid function, chemistry profile and food IgE profiles were negative. CBC from October 31 showed WBC 10,900 without elevated eosinophils. Seafood allergy panel negative. Gen. [place] allergy IgE panel on 07/18/2012 showed elevation to cat and dog. Food IgG elevation for egg white, milk, peanut and wheat.  07/01/14- 77 yo F never smoker followed for urticaria, allergic rhinitis, complicated by hypothyroid, GERD FOLLOWS FOR: per Dr Etter Sjogren; hives x 2 months-has been on prednisone as well. Hives had stopped after being seen in 2013. Began again in August without definite trigger except she questions role of stress. No angioedema in throat or tongue. Pressure spots such as waistbands raise question of latex exposure. No clear association with heat  or sunlight. Hives completely stopped while she was on prednisone until she tapered to 10 mg which is what she is taking now is daily maintenance. Stopped and prednisone for one day resulted in generalized hives again. She continues to take Allegra in the morning, hydroxyzine at bedtime, Zyrtec at 5 PM, Pepcid 40 mg twice daily and Singulair once daily, in addition to the prednisone. Hypothyroidism is being treated  She remembers Dermographism being demonstrated in the past, but I could not demonstrated with a simple scratch on this visit. She is not aware of any chronic infection such as hepatitis, or inflammatory process. Medications were reviewed. She denies pregnancy or use of female hormones.  08/12/14-  79 yo F never smoker followed for urticaria, allergic rhinitis, complicated by hypothyroid, GERD FOLLOW FOR:  Hives; has not had any hives this week.  no complaints She credits better control of hives to use of doxepin, taking 310 mg daily. She has been able to cut down Allegra and Zyrtec and has stopped hydroxyzine. She denies significant daytime sleepiness but is on long-term Ritalin for ADD. We discussed anxiety also benefiting from doxepin. We discussed Xolair as an alternative.  02/10/15- 40 yo F never smoker followed for urticaria, allergic rhinitis, complicated by hypothyroid, GERD FOLLOWS KGM:WNUUV well,no hives,allergies good. Doxepin taking 2 or 310 mg every evening as given her significantly better control of urticaria. She was able to stop Zyrtec but continues morning Allegra.  ROS-see HPI Constitutional:   No-   weight loss, night sweats, fevers, chills, fatigue, lassitude. HEENT:   No-  headaches, difficulty swallowing, tooth/dental problems, sore throat,  No-  sneezing, itching, ear ache, nasal congestion, post nasal drip,  CV:  No-   chest pain, orthopnea, PND, swelling in lower extremities, anasarca, dizziness, palpitations Resp: No-   shortness of breath with exertion  or at rest.              No-   productive cough,  No non-productive cough,  No- coughing up of blood.              No-   change in color of mucus.  No- wheezing.   Skin: +HPI GI:  No-   heartburn, indigestion, abdominal pain, nausea, vomiting, GU:  MS:  No-   joint pain or swelling.   Neuro-     nothing unusual Psych:  No- change in mood or affect. No depression or anxiety(+"stress").  No memory loss.  OBJ- Physical Exam General- Alert, Oriented, Affect-appropriate, Distress- none acute. Appears well Skin- clear Lymphadenopathy- none Head- atraumatic            Eyes- Gross vision intact, PERRLA, conjunctivae and secretions clear            Ears- Hearing, canals-normal            Nose- Clear, no-Septal dev, mucus, polyps, erosion, perforation             Throat- Mallampati II , mucosa clear , drainage- none, tonsils- atrophic Neck- flexible , trachea midline, no stridor , thyroid nl, carotid no bruit Chest - symmetrical excursion , unlabored           Heart/CV- RRR , no murmur , no gallop  , no rub, nl s1 s2                           - JVD- none , edema- none, stasis changes- none, varices- none           Lung- clear to P&A, wheeze- none, cough- none , dullness-none, rub- none           Chest wall-  Abd-  Br/ Gen/ Rectal- Not done, not indicated Extrem- cyanosis- none, clubbing, none, atrophy- none, strength- nl Neuro- grossly intact to observation

## 2015-02-10 NOTE — Patient Instructions (Signed)
Ok to continue the doxepin for now. You might next try dropping the allegra to see if you still need it.  Please call as needed

## 2015-02-24 ENCOUNTER — Other Ambulatory Visit: Payer: Self-pay | Admitting: Internal Medicine

## 2015-02-24 NOTE — Telephone Encounter (Signed)
CY Please advise if okay to refill.

## 2015-02-24 NOTE — Telephone Encounter (Signed)
Ok to refill 

## 2015-03-01 NOTE — Telephone Encounter (Signed)
Ok to refill 

## 2015-03-05 NOTE — Assessment & Plan Note (Signed)
She has felt calmer with doxepin.

## 2015-03-05 NOTE — Assessment & Plan Note (Signed)
Doxepin has helped significantly, either as an antihistamine or for help with stress which has seemed to be her trigger Plan-okay to try off Allegra as tolerated

## 2015-03-15 ENCOUNTER — Other Ambulatory Visit (INDEPENDENT_AMBULATORY_CARE_PROVIDER_SITE_OTHER): Payer: BLUE CROSS/BLUE SHIELD

## 2015-03-15 DIAGNOSIS — E039 Hypothyroidism, unspecified: Secondary | ICD-10-CM | POA: Diagnosis not present

## 2015-03-15 LAB — TSH: TSH: 2.94 u[IU]/mL (ref 0.35–4.50)

## 2015-03-15 LAB — T4, FREE: FREE T4: 0.94 ng/dL (ref 0.60–1.60)

## 2015-03-16 ENCOUNTER — Other Ambulatory Visit: Payer: Self-pay | Admitting: Endocrinology

## 2015-03-17 ENCOUNTER — Other Ambulatory Visit: Payer: BC Managed Care – PPO

## 2015-03-22 ENCOUNTER — Ambulatory Visit: Payer: BC Managed Care – PPO | Admitting: Endocrinology

## 2015-03-29 ENCOUNTER — Encounter: Payer: Self-pay | Admitting: Endocrinology

## 2015-03-29 ENCOUNTER — Ambulatory Visit (INDEPENDENT_AMBULATORY_CARE_PROVIDER_SITE_OTHER): Payer: BLUE CROSS/BLUE SHIELD | Admitting: Endocrinology

## 2015-03-29 VITALS — BP 122/70 | HR 72 | Temp 97.7°F | Resp 16 | Ht 67.0 in | Wt 188.4 lb

## 2015-03-29 DIAGNOSIS — R5383 Other fatigue: Secondary | ICD-10-CM

## 2015-03-29 DIAGNOSIS — E039 Hypothyroidism, unspecified: Secondary | ICD-10-CM | POA: Diagnosis not present

## 2015-03-29 NOTE — Progress Notes (Signed)
Patient ID: Shelby Warren, female   DOB: 02/16/1966, 49 y.o.   MRN: [id number]    Reason for Appointment:  Hypothyroidism, follow-up visit    History of Present Illness:    HYPOTHYROIDISM  was first diagnosed  Soon after her pregnancy about 17 years ago At that time she was having marked fatigue and weakness along with some difficulty with balance Also does not know how low her thyroid levels were initially but she felt better with starting thyroid supplements For several years the patient had been supplemented with levothyroxine 125 mcg daily  Since 11/2013 her dosage has been increased because of relatively high TSH levels Since her TSH was 13 in 9/15 and she was symptomatic with fatigue, some cold sensitivity, mild hair loss and dry skin her dose was increased from 150 up to 200 mcg daily  Subsequently TSH has been normal  More recently the patient has had significant symptoms of fatigue, weight gain and she is concerned that her thyroid is not adequately replaced She continues to have heavier menstrual cycles but more for the last couple of months.   She has gained a couple of pounds since last year only  She has been very compliant with taking the medication about 30 minutes before breakfast She is not taking any multivitamins or iron supplements at this time.  She is taking chewable calcium but about 2 hours after her breakfast although has not taken any for the last month     Lab Results  Component Value Date   TSH 2.94 03/15/2015   TSH 0.64 09/29/2014   TSH 1.063 09/20/2014   FREET4 0.94 03/15/2015   FREET4 1.09 09/29/2014   FREET4 1.34 12/06/2011     Past Medical History  Diagnosis Date  . Allergic rhinitis   . Hypothyroidism   . ADD (attention deficit disorder)   . Asthma   . B12 deficiency     Past Surgical History  Procedure Laterality Date  . Anal fissure repair    . Spincture muscle    . Lasic eye surgery      Family History  Problem  Relation Age of Onset  . Breast cancer Paternal Grandmother   . Thyroid disease Paternal Grandmother   . Diabetes Maternal Grandfather     Social History:  reports that she has never smoked. She has never used smokeless tobacco. She reports that she drinks alcohol. She reports that she does not use illicit drugs.  Allergies:  Allergies  Allergen Reactions  . Penicillins       Medication List       This list is accurate as of: 03/29/15  8:06 AM.  Always use your most recent med list.               ACZONE 5 % topical gel  Generic drug:  Dapsone     albuterol 108 (90 BASE) MCG/ACT inhaler  Commonly known as:  VENTOLIN HFA  INHALE 2 PUFFS BY MOUTH EVERY 4 TO 6 HOURS AS NEEDED     doxepin 10 MG capsule  Commonly known as:  SINEQUAN  TAKE 1-3 CAPSULES AT BEDTIME AS NEEDED FOR HIVES     famotidine 40 MG tablet  Commonly known as:  PEPCID  Take 1 tablet (40 mg total) by mouth 2 (two) times daily.     fexofenadine 180 MG tablet  Commonly known as:  ALLEGRA  Take 180 mg by mouth daily.     methylphenidate 10 MG tablet  Commonly known as:  RITALIN  Take 1 tablet (10 mg total) by mouth 3 (three) times daily.     methylphenidate 10 MG tablet  Commonly known as:  RITALIN  Take 1 tablet (10 mg total) by mouth 3 (three) times daily with meals.     montelukast 10 MG tablet  Commonly known as:  SINGULAIR  TAKE 1 TABLET BY MOUTH AT BEDTIME     SYNTHROID 200 MCG tablet  Generic drug:  levothyroxine  TAKE 1 TABLET BY MOUTH EVERY DAY BEFORE BREAKFAST        Review of Systems:       She takes B12  for  B12 deficiency and recently has been changed to sublingual formulation and is awaiting follow-up levels  She wakes up feeling tired but is taking doxepin at night.  She tends to get somewhat sleepy later in the afternoon or evening but not weak   Examination:    BP 122/70 mmHg  Pulse 72  Temp(Src) 97.7 F (36.5 C)  Resp 16  Ht 5\' 7"  (1.702 m)  Wt 188 lb 6.4 oz  (85.458 kg)  BMI 29.50 kg/m2  SpO2 98%   General Appearance: pleasant, looks well with mild facial puffiness    Eyes: No prominence of the eyes          Neck: The thyroid is nonpalpable.    Neurological: REFLEXES: at biceps are normal.     No peripheral edema  Assessment:   HYPOTHYROIDISM, primary, since 1998 Although her TSH is back to normal she is complaining of significant amount of fatigue which is somewhat more a problem with sleepiness and does not have associated typical symptoms of hypothyroidism She thinks she has gained a lot of weight but it is only 3 pounds since last year  She is requiring larger doses of thyroxine supplements since 2015 and is already on 200 g which is more than expected for her weight Discussed that it is unlikely that her symptoms are related to hypothyroid and especially since symptoms are recent in onset and TSH is consistent On exam she looks euthyroid  She does have problems with heavy menstrual bleeding and has not had any evaluation of her iron or CBC, not clear if this was checked by gynecologist     PLAN:    Continue same dose She will discuss her fatigue with PCP and consider iron deficiency anemia, sleep disorder, depression and other issues She may empirically try [place] thyroid but will wait as she is evaluated by PCP Otherwise will see her in 6 months again    [facility] 03/29/2015, 8:06 AM

## 2015-04-11 ENCOUNTER — Other Ambulatory Visit: Payer: Self-pay | Admitting: Family Medicine

## 2015-04-11 ENCOUNTER — Encounter: Payer: Self-pay | Admitting: Family Medicine

## 2015-04-11 DIAGNOSIS — R5382 Chronic fatigue, unspecified: Secondary | ICD-10-CM

## 2015-04-11 DIAGNOSIS — E538 Deficiency of other specified B group vitamins: Secondary | ICD-10-CM

## 2015-04-12 ENCOUNTER — Encounter: Payer: Self-pay | Admitting: Family Medicine

## 2015-04-12 DIAGNOSIS — F988 Other specified behavioral and emotional disorders with onset usually occurring in childhood and adolescence: Secondary | ICD-10-CM

## 2015-04-12 MED ORDER — METHYLPHENIDATE HCL 10 MG PO TABS: 10.0000 mg | ORAL_TABLET | Freq: Three times a day (TID) | ORAL | Status: DC

## 2015-04-13 ENCOUNTER — Other Ambulatory Visit (INDEPENDENT_AMBULATORY_CARE_PROVIDER_SITE_OTHER): Payer: BLUE CROSS/BLUE SHIELD

## 2015-04-13 DIAGNOSIS — R5382 Chronic fatigue, unspecified: Secondary | ICD-10-CM

## 2015-04-13 DIAGNOSIS — E538 Deficiency of other specified B group vitamins: Secondary | ICD-10-CM | POA: Diagnosis not present

## 2015-04-13 DIAGNOSIS — E039 Hypothyroidism, unspecified: Secondary | ICD-10-CM

## 2015-04-13 LAB — CBC WITH DIFFERENTIAL/PLATELET
Basophils Absolute: 0 10*3/uL (ref 0.0–0.1)
Basophils Relative: 0.4 % (ref 0.0–3.0)
EOS PCT: 3.2 % (ref 0.0–5.0)
Eosinophils Absolute: 0.2 10*3/uL (ref 0.0–0.7)
HEMATOCRIT: 39 % (ref 36.0–46.0)
HEMOGLOBIN: 13.4 g/dL (ref 12.0–15.0)
Lymphocytes Relative: 31.3 % (ref 12.0–46.0)
Lymphs Abs: 1.9 10*3/uL (ref 0.7–4.0)
MCHC: 34.2 g/dL (ref 30.0–36.0)
MCV: 90.7 fl (ref 78.0–100.0)
Monocytes Absolute: 0.4 10*3/uL (ref 0.1–1.0)
Monocytes Relative: 5.9 % (ref 3.0–12.0)
Neutro Abs: 3.5 10*3/uL (ref 1.4–7.7)
Neutrophils Relative %: 59.2 % (ref 43.0–77.0)
PLATELETS: 269 10*3/uL (ref 150.0–400.0)
RBC: 4.3 Mil/uL (ref 3.87–5.11)
RDW: 13.2 % (ref 11.5–15.5)
WBC: 6 10*3/uL (ref 4.0–10.5)

## 2015-04-13 LAB — BASIC METABOLIC PANEL
BUN: 16 mg/dL (ref 6–23)
CHLORIDE: 104 meq/L (ref 96–112)
CO2: 27 mEq/L (ref 19–32)
Calcium: 9.3 mg/dL (ref 8.4–10.5)
Creatinine, Ser: 0.99 mg/dL (ref 0.40–1.20)
GFR: 63.54 mL/min (ref >60.00)
Glucose, Bld: 87 mg/dL (ref 70–99)
POTASSIUM: 4.5 meq/L (ref 3.5–5.1)
Sodium: 139 mEq/L (ref 135–145)

## 2015-04-13 LAB — T4, FREE: FREE T4: 0.85 ng/dL (ref 0.60–1.60)

## 2015-04-13 LAB — TSH: TSH: 9.69 u[IU]/mL — ABNORMAL HIGH (ref 0.35–4.50)

## 2015-04-13 LAB — VITAMIN B12: Vitamin B-12: 998 pg/mL — ABNORMAL HIGH (ref 211–911)

## 2015-04-15 ENCOUNTER — Telehealth: Payer: Self-pay | Admitting: Endocrinology

## 2015-04-15 ENCOUNTER — Other Ambulatory Visit: Payer: Self-pay | Admitting: *Deleted

## 2015-04-15 MED ORDER — LEVOTHYROXINE SODIUM 25 MCG PO TABS: 25.0000 ug | ORAL_TABLET | Freq: Every day | ORAL | Status: DC

## 2015-04-15 NOTE — Telephone Encounter (Signed)
Gave pt results from the labs and also made her a 6 week appt with labs 3 days prior to appt

## 2015-04-15 NOTE — Telephone Encounter (Signed)
Pt called returning [facility] phone call, please call pt back

## 2015-04-17 ENCOUNTER — Other Ambulatory Visit: Payer: Self-pay | Admitting: Family Medicine

## 2015-04-21 ENCOUNTER — Encounter: Payer: Self-pay | Admitting: Gastroenterology

## 2015-05-13 ENCOUNTER — Ambulatory Visit (INDEPENDENT_AMBULATORY_CARE_PROVIDER_SITE_OTHER): Payer: BLUE CROSS/BLUE SHIELD | Admitting: Family Medicine

## 2015-05-13 ENCOUNTER — Encounter: Payer: Self-pay | Admitting: Family Medicine

## 2015-05-13 VITALS — BP 120/70 | HR 85 | Temp 98.5°F | Ht 67.0 in | Wt 190.8 lb

## 2015-05-13 DIAGNOSIS — E538 Deficiency of other specified B group vitamins: Secondary | ICD-10-CM | POA: Diagnosis not present

## 2015-05-13 DIAGNOSIS — E038 Other specified hypothyroidism: Secondary | ICD-10-CM

## 2015-05-13 LAB — VITAMIN B12: Vitamin B-12: >1500 pg/mL — ABNORMAL HIGH (ref 211–911)

## 2015-05-13 LAB — T4, FREE: FREE T4: 1.2 ng/dL (ref 0.60–1.60)

## 2015-05-13 LAB — TSH: TSH: 4.98 u[IU]/mL — AB (ref 0.35–4.50)

## 2015-05-13 NOTE — Progress Notes (Signed)
Pre visit review using our clinic review tool, if applicable. No additional management support is needed unless otherwise documented below in the visit note. 

## 2015-05-13 NOTE — Assessment & Plan Note (Signed)
Recheck tsh today US thyroid

## 2015-05-13 NOTE — Assessment & Plan Note (Signed)
Recheck b12 today

## 2015-05-13 NOTE — Progress Notes (Signed)
Patient ID: Shelby Warren, female   DOB: 1966-04-12, 49 y.o.   MRN: [id number]   Subjective:    Patient ID: Shelby Warren, female    DOB: 06-08-1966, 49 y.o.   MRN: [id number]  Chief Complaint  Patient presents with  . Thyroid Problem    Thyroid check    HPI Patient is in today for f/u thyroid.  Pt was having palpitations and felt terrible on 225 of synthroid so she stopped the extra 25 and stayed on 200 mcg.  She is also concerned because her TSH is all over the place.  She was told her thyroid is not working so then "why is the TSH fluctuating ".   She was frustrated when she called endo to switch to a female she was turned down.  It she needs an endo she would like to go somewhere else.    Past Medical History  Diagnosis Date  . Allergic rhinitis   . Hypothyroidism   . ADD (attention deficit disorder)   . Asthma   . B12 deficiency     Past Surgical History  Procedure Laterality Date  . Anal fissure repair    . Spincture muscle    . Lasic eye surgery      Family History  Problem Relation Age of Onset  . Breast cancer Paternal Grandmother   . Thyroid disease Paternal Grandmother   . Diabetes Maternal Grandfather     Social History   Social History  . Marital Status: Married    Spouse Name: N/A  . Number of Children: 2  . Years of Education: N/A   Occupational History  .  [employer] History Main Topics  . Smoking status: Never Smoker   . Smokeless tobacco: Never Used  . Alcohol Use: Yes     Comment: occasional   . Drug Use: No  . Sexual Activity: Not on file   Other Topics Concern  . Not on file   Social History Narrative    Outpatient Prescriptions Prior to Visit  Medication Sig Dispense Refill  . ACZONE 5 % topical gel     . albuterol (VENTOLIN HFA) 108 (90 BASE) MCG/ACT inhaler INHALE 2 PUFFS BY MOUTH EVERY 4 TO 6 HOURS AS NEEDED 18 each 1  . doxepin (SINEQUAN) 10 MG capsule TAKE 1-3 CAPSULES AT BEDTIME AS NEEDED FOR  HIVES 90 capsule 5  . famotidine (PEPCID) 40 MG tablet Take 1 tablet (40 mg total) by mouth 2 (two) times daily. 60 tablet 5  . fexofenadine (ALLEGRA) 180 MG tablet Take 180 mg by mouth daily.    . methylphenidate (RITALIN) 10 MG tablet Take 1 tablet (10 mg total) by mouth 3 (three) times daily with meals. 90 tablet 0  . montelukast (SINGULAIR) 10 MG tablet TAKE 1 TABLET BY MOUTH AT BEDTIME 30 tablet 11  . SYNTHROID 200 MCG tablet TAKE 1 TABLET BY MOUTH EVERY DAY BEFORE BREAKFAST 30 tablet 2  . methylphenidate (RITALIN) 10 MG tablet Take 1 tablet (10 mg total) by mouth 3 (three) times daily with meals. 90 tablet 0  . methylphenidate (RITALIN) 10 MG tablet Take 1 tablet (10 mg total) by mouth 3 (three) times daily. 90 tablet 0  . methylphenidate (RITALIN) 10 MG tablet Take 1 tablet (10 mg total) by mouth 3 (three) times daily with meals. 90 tablet 0   No facility-administered medications prior to visit.    Allergies  Allergen Reactions  .  Penicillins     Review of Systems  Constitutional: Negative for fever and malaise/fatigue.  HENT: Negative for congestion.   Eyes: Negative for discharge.  Respiratory: Negative for shortness of breath.   Cardiovascular: Negative for chest pain, palpitations and leg swelling.  Gastrointestinal: Negative for nausea and abdominal pain.  Genitourinary: Negative for dysuria.  Musculoskeletal: Negative for falls.  Skin: Negative for rash.  Neurological: Negative for loss of consciousness and headaches.  Endo/Heme/Allergies: Negative for environmental allergies.  Psychiatric/Behavioral: Negative for depression. The patient is not nervous/anxious.        Objective:    Physical Exam  Constitutional: She is oriented to person, place, and time. She appears well-developed and well-nourished.  HENT:  Head: Normocephalic and atraumatic.  Eyes: Conjunctivae and EOM are normal.  Neck: Normal range of motion. Neck supple. No JVD present. Carotid bruit is not  present. No thyromegaly present.  Cardiovascular: Normal rate, regular rhythm and normal heart sounds.   No murmur heard. Pulmonary/Chest: Effort normal and breath sounds normal. No respiratory distress. She has no wheezes. She has no rales. She exhibits no tenderness.  Musculoskeletal: She exhibits no edema.  Neurological: She is alert and oriented to person, place, and time.  Psychiatric: She has a normal mood and affect. Her behavior is normal.  Nursing note and vitals reviewed.   BP 120/70 mmHg  Pulse 85  Temp(Src) 98.5 F (36.9 C) (Oral)  Ht 5\' 7"  (1.702 m)  Wt 190 lb 12.8 oz (86.546 kg)  BMI 29.88 kg/m2  SpO2 98%  LMP 04/23/2015 Wt Readings from Last 3 Encounters:  05/13/15 190 lb 12.8 oz (86.546 kg)  03/29/15 188 lb 6.4 oz (85.458 kg)  02/10/15 187 lb 12.8 oz (85.186 kg)     Lab Results  Component Value Date   WBC 6.0 04/13/2015   HGB 13.4 04/13/2015   HCT 39.0 04/13/2015   PLT 269.0 04/13/2015   GLUCOSE 87 04/13/2015   CHOL 200 07/04/2012   TRIG 77.0 07/04/2012   HDL 61.30 07/04/2012   LDLCALC 123* 07/04/2012   ALT 15 07/04/2012   AST 17 07/04/2012   NA 139 04/13/2015   K 4.5 04/13/2015   CL 104 04/13/2015   CREATININE 0.99 04/13/2015   BUN 16 04/13/2015   CO2 27 04/13/2015   TSH 9.69* 04/13/2015    Lab Results  Component Value Date   TSH 9.69* 04/13/2015   Lab Results  Component Value Date   WBC 6.0 04/13/2015   HGB 13.4 04/13/2015   HCT 39.0 04/13/2015   MCV 90.7 04/13/2015   PLT 269.0 04/13/2015   Lab Results  Component Value Date   NA 139 04/13/2015   K 4.5 04/13/2015   CO2 27 04/13/2015   GLUCOSE 87 04/13/2015   BUN 16 04/13/2015   CREATININE 0.99 04/13/2015   BILITOT 1.0 07/04/2012   ALKPHOS 26* 07/04/2012   AST 17 07/04/2012   ALT 15 07/04/2012   PROT 7.4 07/04/2012   ALBUMIN 4.1 07/04/2012   CALCIUM 9.3 04/13/2015   GFR 63.54 04/13/2015   Lab Results  Component Value Date   CHOL 200 07/04/2012   Lab Results  Component  Value Date   HDL 61.30 07/04/2012   Lab Results  Component Value Date   LDLCALC 123* 07/04/2012   Lab Results  Component Value Date   TRIG 77.0 07/04/2012   Lab Results  Component Value Date   CHOLHDL 3 07/04/2012   No results found for: HGBA1C     Assessment &  Plan:   Problem List Items Addressed This Visit    Hypothyroidism    Recheck tsh today US thyroid      Relevant Orders   T4, free   TSH   US Soft Tissue Head/Neck   B12 deficiency - Primary    Recheck b12 today      Relevant Orders   Vitamin B12      I am having Ms. Whilden maintain her ACZONE, montelukast, albuterol, fexofenadine, famotidine, doxepin, SYNTHROID, and methylphenidate.  No orders of the defined types were placed in this encounter.     Garnet Koyanagi, DO

## 2015-05-13 NOTE — Patient Instructions (Signed)
Hypothyroidism The thyroid is a large gland located in the lower front of your neck. The thyroid gland helps control metabolism. Metabolism is how your body handles food. It controls metabolism with the hormone thyroxine. When this gland is underactive (hypothyroid), it produces too little hormone.  CAUSES These include:   Absence or destruction of thyroid tissue.  Goiter due to iodine deficiency.  Goiter due to medications.  Congenital defects (since birth).  Problems with the pituitary. This causes a lack of TSH (thyroid stimulating hormone). This hormone tells the thyroid to turn out more hormone. SYMPTOMS  Lethargy (feeling as though you have no energy)  Cold intolerance  Weight gain (in spite of normal food intake)  Dry skin  Coarse hair  Menstrual irregularity (if severe, may lead to infertility)  Slowing of thought processes Cardiac problems are also caused by insufficient amounts of thyroid hormone. Hypothyroidism in the newborn is cretinism, and is an extreme form. It is important that this form be treated adequately and immediately or it will lead rapidly to retarded physical and mental development. DIAGNOSIS  To prove hypothyroidism, your caregiver may do blood tests and ultrasound tests. Sometimes the signs are hidden. It may be necessary for your caregiver to watch this illness with blood tests either before or after diagnosis and treatment. TREATMENT  Low levels of thyroid hormone are increased by using synthetic thyroid hormone. This is a safe, effective treatment. It usually takes about four weeks to gain the full effects of the medication. After you have the full effect of the medication, it will generally take another four weeks for problems to leave. Your caregiver may start you on low doses. If you have had heart problems the dose may be gradually increased. It is generally not an emergency to get rapidly to normal. HOME CARE INSTRUCTIONS   Take your  medications as your caregiver suggests. Let your caregiver know of any medications you are taking or start taking. Your caregiver will help you with dosage schedules.  As your condition improves, your dosage needs may increase. It will be necessary to have continuing blood tests as suggested by your caregiver.  Report all suspected medication side effects to your caregiver. SEEK MEDICAL CARE IF: Seek medical care if you develop:  Sweating.  Tremulousness (tremors).  Anxiety.  Rapid weight loss.  Heat intolerance.  Emotional swings.  Diarrhea.  Weakness. SEEK IMMEDIATE MEDICAL CARE IF:  You develop chest pain, an irregular heart beat (palpitations), or a rapid heart beat. MAKE SURE YOU:   Understand these instructions.  Will watch your condition.  Will get help right away if you are not doing well or get worse. Document Released: 09/17/2005 Document Revised: 12/10/2011 Document Reviewed: 05/07/2008 Exit[employer] Patient Information 2015 [employer]. This information is not intended to replace advice given to you by your health care provider. Make sure you discuss any questions you have with your health care provider.  

## 2015-05-14 ENCOUNTER — Other Ambulatory Visit: Payer: Self-pay | Admitting: Endocrinology

## 2015-05-14 ENCOUNTER — Other Ambulatory Visit: Payer: Self-pay | Admitting: Family Medicine

## 2015-05-18 ENCOUNTER — Ambulatory Visit (HOSPITAL_BASED_OUTPATIENT_CLINIC_OR_DEPARTMENT_OTHER)
Admission: RE | Admit: 2015-05-18 | Discharge: 2015-05-18 | Disposition: A | Discharge status: Home / Self Care | Payer: BLUE CROSS/BLUE SHIELD | Source: Ambulatory Visit | Attending: Family Medicine | Admitting: Family Medicine

## 2015-05-18 DIAGNOSIS — E038 Other specified hypothyroidism: Secondary | ICD-10-CM

## 2015-05-18 DIAGNOSIS — E039 Hypothyroidism, unspecified: Secondary | ICD-10-CM | POA: Diagnosis not present

## 2015-05-30 ENCOUNTER — Other Ambulatory Visit: Payer: BLUE CROSS/BLUE SHIELD

## 2015-06-01 ENCOUNTER — Ambulatory Visit: Payer: BLUE CROSS/BLUE SHIELD | Admitting: Endocrinology

## 2015-07-10 ENCOUNTER — Other Ambulatory Visit: Payer: Self-pay | Admitting: Family Medicine

## 2015-07-12 ENCOUNTER — Encounter: Payer: Self-pay | Admitting: Family Medicine

## 2015-07-12 ENCOUNTER — Other Ambulatory Visit: Payer: Self-pay | Admitting: Family Medicine

## 2015-07-12 DIAGNOSIS — E079 Disorder of thyroid, unspecified: Secondary | ICD-10-CM

## 2015-07-12 MED ORDER — METHYLPHENIDATE HCL 10 MG PO TABS: 10.0000 mg | ORAL_TABLET | Freq: Three times a day (TID) | ORAL | Status: DC

## 2015-07-13 ENCOUNTER — Other Ambulatory Visit: Payer: Self-pay | Admitting: Family Medicine

## 2015-07-13 NOTE — Telephone Encounter (Signed)
Refill sent for Singulair. Patient is due for follow-up with Dr. Etter Sjogren  On 08/13/15. Please call patient to schedule follow-up.

## 2015-07-14 ENCOUNTER — Other Ambulatory Visit (INDEPENDENT_AMBULATORY_CARE_PROVIDER_SITE_OTHER): Payer: BLUE CROSS/BLUE SHIELD

## 2015-07-14 DIAGNOSIS — E079 Disorder of thyroid, unspecified: Secondary | ICD-10-CM | POA: Diagnosis not present

## 2015-07-14 LAB — TSH: TSH: 7.12 u[IU]/mL — ABNORMAL HIGH (ref 0.35–4.50)

## 2015-07-14 NOTE — Telephone Encounter (Signed)
Had appt scheduled 07/18/15 and Maudie Mercury cancelled with note that Dr. Etter Sjogren states pt does not need appt.

## 2015-07-15 ENCOUNTER — Other Ambulatory Visit: Payer: Self-pay

## 2015-07-15 DIAGNOSIS — R946 Abnormal results of thyroid function studies: Secondary | ICD-10-CM

## 2015-07-18 ENCOUNTER — Ambulatory Visit: Payer: BLUE CROSS/BLUE SHIELD | Admitting: Family Medicine

## 2015-07-20 ENCOUNTER — Other Ambulatory Visit: Payer: Self-pay | Admitting: Family Medicine

## 2015-07-20 DIAGNOSIS — E039 Hypothyroidism, unspecified: Secondary | ICD-10-CM

## 2015-08-10 ENCOUNTER — Other Ambulatory Visit: Payer: Self-pay | Admitting: Family Medicine

## 2015-08-11 MED ORDER — METHYLPHENIDATE HCL 10 MG PO TABS: 10.0000 mg | ORAL_TABLET | Freq: Three times a day (TID) | ORAL | Status: DC

## 2015-08-15 ENCOUNTER — Encounter: Payer: Self-pay | Admitting: Internal Medicine

## 2015-08-15 ENCOUNTER — Ambulatory Visit (INDEPENDENT_AMBULATORY_CARE_PROVIDER_SITE_OTHER): Payer: BLUE CROSS/BLUE SHIELD | Admitting: Internal Medicine

## 2015-08-15 VITALS — BP 112/72 | HR 82 | Ht 67.0 in | Wt 191.8 lb

## 2015-08-15 DIAGNOSIS — Z23 Encounter for immunization: Secondary | ICD-10-CM | POA: Diagnosis not present

## 2015-08-15 DIAGNOSIS — L501 Idiopathic urticaria: Secondary | ICD-10-CM

## 2015-08-15 NOTE — Assessment & Plan Note (Signed)
We discussed gradual weaning off of her H1 and H2 antihistamines including doxepin, Pepcid, Allegra, and also Singulair, as tolerated. Her primary physician could maintain these medications if she doesn't need anything else.

## 2015-08-15 NOTE — Progress Notes (Signed)
09/23/12- 49 yo F never smoker referred for allergy evaluation courtesy of Dr Etter Sjogren Ref by Dr. Henrietta Hoover hives reaction unsure of what allergen is, has been occuring 2-2 1/2 months since onset in October of 2013. At that time she was under job stress and she had just changed to omeprazole to Nexium. Hives persisted. Nexium was then changed to Pepcid a.c. The hives seem to resolve until 2 days before this visit. She had allergy evaluation with Dr. Fredderick Phenix in October. She has been taking Allegra 180 mg x2 daily, Pepcid AC, Zyrtec and Singulair. At bedtime she has tried hydroxyzine 20 mg. Her last dose of prednisone was completed 3 weeks ago. Today she woke with nasal congestion but nasal symptoms and chest tightness or wheezing have not really been part of this illness up to now. She has known history of seafood allergy triggering hives and angioedema with asthma 20 years ago. She has not recognized problems from cosmetics, soaps or other exposures but is using hypoallergenic cleansers. Her job for [employer] requires in-state travel, but no suspect exposures. Social alcohol. No recognized environmental exposure triggers. Son gets hives from [employer]-Pak  Lab- thyroid function, chemistry profile and food IgE profiles were negative. CBC from October 31 showed WBC 10,900 without elevated eosinophils. Seafood allergy panel negative. Gen. [place] allergy IgE panel on 07/18/2012 showed elevation to cat and dog. Food IgG elevation for egg white, milk, peanut and wheat.  07/01/14- 14 yo F never smoker followed for urticaria, allergic rhinitis, complicated by hypothyroid, GERD FOLLOWS FOR: per Dr Etter Sjogren; hives x 2 months-has been on prednisone as well. Hives had stopped after being seen in 2013. Began again in August without definite trigger except she questions role of stress. No angioedema in throat or tongue. Pressure spots such as waistbands raise question of latex exposure. No clear association with heat  or sunlight. Hives completely stopped while she was on prednisone until she tapered to 10 mg which is what she is taking now is daily maintenance. Stopped and prednisone for one day resulted in generalized hives again. She continues to take Allegra in the morning, hydroxyzine at bedtime, Zyrtec at 5 PM, Pepcid 40 mg twice daily and Singulair once daily, in addition to the prednisone. Hypothyroidism is being treated  She remembers Dermographism being demonstrated in the past, but I could not demonstrated with a simple scratch on this visit. She is not aware of any chronic infection such as hepatitis, or inflammatory process. Medications were reviewed. She denies pregnancy or use of female hormones.  08/12/14-  49 yo F never smoker followed for urticaria, allergic rhinitis, complicated by hypothyroid, GERD FOLLOW FOR:  Hives; has not had any hives this week.  no complaints She credits better control of hives to use of doxepin, taking 310 mg daily. She has been able to cut down Allegra and Zyrtec and has stopped hydroxyzine. She denies significant daytime sleepiness but is on long-term Ritalin for ADD. We discussed anxiety also benefiting from doxepin. We discussed Xolair as an alternative.  02/10/15- 49 yo F never smoker followed for urticaria, allergic rhinitis, complicated by hypothyroid, GERD FOLLOWS [id number] well,no hives,allergies good. Doxepin taking 2 or 310 mg every evening has given her significantly better control of urticaria. She was able to stop Zyrtec but continues morning Allegra.  08/15/15- 49 yo F never smoker followed for urticaria, allergic rhinitis, complicated by hypothyroid, GERD Follows for urticaria and allergies. Pt states that she has been doing well. Pt denies any hives, nasal  congestion or asthma flares.  No hives in the past year. Using doxepin 20 mg in the evening along with Pepcid, Allegra, Singulair. She accepts a certain amount of daytime tiredness related partly to  doxepin and may be partly to thyroid which is being adjusted.  ROS-see HPI Constitutional:   No-   weight loss, night sweats, fevers, chills, fatigue, lassitude. HEENT:   No-  headaches, difficulty swallowing, tooth/dental problems, sore throat,       No-  sneezing, itching, ear ache, nasal congestion, post nasal drip,  CV:  No-   chest pain, orthopnea, PND, swelling in lower extremities, anasarca, dizziness, palpitations Resp: No-   shortness of breath with exertion or at rest.              No-   productive cough,  No non-productive cough,  No- coughing up of blood.              No-   change in color of mucus.  No- wheezing.   Skin: +HPI GI:  No-   heartburn, indigestion, abdominal pain, nausea, vomiting, GU:  MS:  No-   joint pain or swelling.   Neuro-     nothing unusual Psych:  No- change in mood or affect. No depression or anxiety(+"stress").  No memory loss.  OBJ- Physical Exam General- Alert, Oriented, Affect-appropriate, Distress- none acute. Appears well Skin- clear Lymphadenopathy- none Head- atraumatic            Eyes- Gross vision intact, PERRLA, conjunctivae and secretions clear            Ears- Hearing, canals-normal            Nose- Clear, no-Septal dev, mucus, polyps, erosion, perforation             Throat- Mallampati II , mucosa clear , drainage- none, tonsils- atrophic Neck- flexible , trachea midline, no stridor , thyroid nl, carotid no bruit Chest - symmetrical excursion , unlabored           Heart/CV- RRR , no murmur , no gallop  , no rub, nl s1 s2                           - JVD- none , edema- none, stasis changes- none, varices- none           Lung- clear to P&A, wheeze- none, cough- none , dullness-none, rub- none           Chest wall-  Abd-  Br/ Gen/ Rectal- Not done, not indicated Extrem- cyanosis- none, clubbing, none, atrophy- none, strength- nl Neuro- grossly intact to observation

## 2015-08-15 NOTE — Patient Instructions (Signed)
Ok to try reducing Doxepin to 1 daily, or alternate 1 with 2 every other day, as needed  Flu vax

## 2015-09-12 ENCOUNTER — Other Ambulatory Visit: Payer: Self-pay | Admitting: Family Medicine

## 2015-09-12 MED ORDER — METHYLPHENIDATE HCL 10 MG PO TABS: 10.0000 mg | ORAL_TABLET | Freq: Three times a day (TID) | ORAL | Status: DC

## 2015-09-23 ENCOUNTER — Other Ambulatory Visit: Payer: BLUE CROSS/BLUE SHIELD

## 2015-09-28 ENCOUNTER — Ambulatory Visit: Payer: BLUE CROSS/BLUE SHIELD | Admitting: Endocrinology

## 2015-10-09 ENCOUNTER — Other Ambulatory Visit: Payer: Self-pay | Admitting: Endocrinology

## 2015-10-11 ENCOUNTER — Other Ambulatory Visit: Payer: Self-pay | Admitting: Family Medicine

## 2015-10-11 ENCOUNTER — Other Ambulatory Visit: Payer: Self-pay | Admitting: Endocrinology

## 2015-10-11 DIAGNOSIS — E039 Hypothyroidism, unspecified: Secondary | ICD-10-CM

## 2015-10-11 MED ORDER — METHYLPHENIDATE HCL 10 MG PO TABS: 10.0000 mg | ORAL_TABLET | Freq: Three times a day (TID) | ORAL | Status: DC

## 2015-10-11 NOTE — Telephone Encounter (Signed)
Rx printed and sent to PCP for signature. [place] printed for patient to sign, UDS ordered, and TSH ordered for f/u of hypothyroid. Pt notified via [place].

## 2015-10-13 ENCOUNTER — Other Ambulatory Visit (INDEPENDENT_AMBULATORY_CARE_PROVIDER_SITE_OTHER): Payer: BLUE CROSS/BLUE SHIELD

## 2015-10-13 DIAGNOSIS — E039 Hypothyroidism, unspecified: Secondary | ICD-10-CM | POA: Diagnosis not present

## 2015-10-13 LAB — TSH: TSH: 3.31 u[IU]/mL (ref 0.35–4.50)

## 2015-10-14 ENCOUNTER — Other Ambulatory Visit: Payer: Self-pay | Admitting: Family Medicine

## 2015-10-17 NOTE — Telephone Encounter (Signed)
30 day supply of levothyroxine sent to pharmacy. Pt last seen by Dr Etter Sjogren 05/2015 and advised a 3 month follow up.  Please call pt to arrange follow up as soon as possible.  Thanks!

## 2015-10-19 NOTE — Telephone Encounter (Signed)
Left VM for patient to call and schedule an appt with Dr. Etter Sjogren

## 2015-11-07 ENCOUNTER — Encounter: Payer: Self-pay | Admitting: Family Medicine

## 2015-11-07 ENCOUNTER — Ambulatory Visit (INDEPENDENT_AMBULATORY_CARE_PROVIDER_SITE_OTHER): Payer: BLUE CROSS/BLUE SHIELD | Admitting: Family Medicine

## 2015-11-07 VITALS — BP 110/70 | HR 82 | Temp 98.3°F | Wt 197.6 lb

## 2015-11-07 DIAGNOSIS — E039 Hypothyroidism, unspecified: Secondary | ICD-10-CM

## 2015-11-07 DIAGNOSIS — F988 Other specified behavioral and emotional disorders with onset usually occurring in childhood and adolescence: Secondary | ICD-10-CM

## 2015-11-07 DIAGNOSIS — F909 Attention-deficit hyperactivity disorder, unspecified type: Secondary | ICD-10-CM

## 2015-11-07 MED ORDER — METHYLPHENIDATE HCL 10 MG PO TABS: 10.0000 mg | ORAL_TABLET | Freq: Three times a day (TID) | ORAL | Status: DC

## 2015-11-07 MED ORDER — SYNTHROID 200 MCG PO TABS: ORAL_TABLET | ORAL | Status: DC

## 2015-11-07 NOTE — Patient Instructions (Signed)

## 2015-11-07 NOTE — Progress Notes (Signed)
Patient ID: Shelby Warren, female    DOB: 1965-11-24  Age: 50 y.o. MRN: [id number]    Subjective:  Subjective HPI Shelby Warren presents for f/u add and thyroid.  No complaints.    Review of Systems  Constitutional: Negative for diaphoresis, appetite change, fatigue and unexpected weight change.  Eyes: Negative for pain, redness and visual disturbance.  Respiratory: Negative for cough, chest tightness, shortness of breath and wheezing.   Cardiovascular: Negative for chest pain, palpitations and leg swelling.  Endocrine: Negative for cold intolerance, heat intolerance, polydipsia, polyphagia and polyuria.  Genitourinary: Negative for dysuria, frequency and difficulty urinating.  Neurological: Negative for dizziness, light-headedness, numbness and headaches.    History Past Medical History  Diagnosis Date  . Allergic rhinitis   . Hypothyroidism   . ADD (attention deficit disorder)   . Asthma   . B12 deficiency     She has past surgical history that includes Anal fissure repair; spincture muscle; and lasic eye surgery.   Her family history includes Breast cancer in her paternal grandmother; Diabetes in her maternal grandfather; Thyroid disease in her paternal grandmother.She reports that she has never smoked. She has never used smokeless tobacco. She reports that she drinks alcohol. She reports that she does not use illicit drugs.  Current Outpatient Prescriptions on File Prior to Visit  Medication Sig Dispense Refill  . ACZONE 5 % topical gel     . albuterol (VENTOLIN HFA) 108 (90 BASE) MCG/ACT inhaler INHALE 2 PUFFS BY MOUTH EVERY 4 TO 6 HOURS AS NEEDED 18 each 1  . doxepin (SINEQUAN) 10 MG capsule TAKE 1-3 CAPSULES AT BEDTIME AS NEEDED FOR HIVES 90 capsule 5  . famotidine (PEPCID) 40 MG tablet TAKE 1 TABLET (40 MG TOTAL) BY MOUTH 2 (TWO) TIMES DAILY. 60 tablet 5  . fexofenadine (ALLEGRA) 180 MG tablet Take 180 mg by mouth daily.    . montelukast (SINGULAIR) 10 MG tablet TAKE 1  TABLET BY MOUTH AT BEDTIME 30 tablet 5   No current facility-administered medications on file prior to visit.     Objective:  Objective Physical Exam  Constitutional: She is oriented to person, place, and time. She appears well-developed and well-nourished.  HENT:  Head: Normocephalic and atraumatic.  Eyes: Conjunctivae and EOM are normal.  Neck: Normal range of motion. Neck supple. No JVD present. Carotid bruit is not present. No thyromegaly present.  Cardiovascular: Normal rate, regular rhythm and normal heart sounds.   No murmur heard. Pulmonary/Chest: Effort normal and breath sounds normal. No respiratory distress. She has no wheezes. She has no rales. She exhibits no tenderness.  Musculoskeletal: She exhibits no edema.  Neurological: She is alert and oriented to person, place, and time.  Psychiatric: She has a normal mood and affect. Her behavior is normal. Judgment and thought content normal.  Nursing note and vitals reviewed.  BP 110/70 mmHg  Pulse 82  Temp(Src) 98.3 F (36.8 C) (Oral)  Wt 197 lb 9.6 oz (89.631 kg)  SpO2 99%  LMP 10/18/2015 Wt Readings from Last 3 Encounters:  11/07/15 197 lb 9.6 oz (89.631 kg)  08/15/15 191 lb 12.8 oz (87 kg)  05/13/15 190 lb 12.8 oz (86.546 kg)     Lab Results  Component Value Date   WBC 6.0 04/13/2015   HGB 13.4 04/13/2015   HCT 39.0 04/13/2015   PLT 269.0 04/13/2015   GLUCOSE 87 04/13/2015   CHOL 200 07/04/2012   TRIG 77.0 07/04/2012   HDL 61.30 07/04/2012  LDLCALC 123* 07/04/2012   ALT 15 07/04/2012   AST 17 07/04/2012   NA 139 04/13/2015   K 4.5 04/13/2015   CL 104 04/13/2015   CREATININE 0.99 04/13/2015   BUN 16 04/13/2015   CO2 27 04/13/2015   TSH 3.31 10/13/2015    US Soft Tissue Head/neck  05/18/2015  CLINICAL DATA:  Hypothyroidism EXAM: THYROID ULTRASOUND TECHNIQUE: Ultrasound examination of the thyroid gland and adjacent soft tissues was performed. COMPARISON:  None. FINDINGS: Right thyroid lobe  Measurements: 2.8 x 1.0 x 0.8 cm. Heterogeneous tissue without focal nodule. Left thyroid lobe Measurements: 2.7 x 0.8 x 1.0 cm. Heterogeneous tissue without focal nodule. Isthmus Thickness: 1 mm.  No nodules visualized. Lymphadenopathy None visualized. IMPRESSION: Heterogeneous gland without focal nodule. The gland is somewhat small. Electronically Signed   By: Marybelle Killings M.D.   On: 05/18/2015 10:26     Assessment & Plan:  Plan I am having Ms. Aloia start on methylphenidate and methylphenidate. I am also having her maintain her ACZONE, albuterol, fexofenadine, doxepin, famotidine, montelukast, SYNTHROID, and methylphenidate.  Meds ordered this encounter  Medications  . SYNTHROID 200 MCG tablet    Sig: TAKE 1 TABLET BY MOUTH EVERY DAY BEFORE BREAKFAST    Dispense:  90 tablet    Refill:  3    PT NEEDS OFFICE VISIT FOR FURTHER REFILLS.  Marland Kitchen methylphenidate (RITALIN) 10 MG tablet    Sig: Take 1 tablet (10 mg total) by mouth 3 (three) times daily with meals.    Dispense:  90 tablet    Refill:  0  . methylphenidate (RITALIN) 10 MG tablet    Sig: Take 1 tablet (10 mg total) by mouth 3 (three) times daily with meals.    Dispense:  90 tablet    Refill:  0    Do not fill until March 10 , 2017  . methylphenidate (RITALIN) 10 MG tablet    Sig: Take 1 tablet (10 mg total) by mouth 3 (three) times daily with meals.    Dispense:  90 tablet    Refill:  0    Do not fill until April 10 , 2017    Problem List Items Addressed This Visit    None    Visit Diagnoses    Hypothyroidism, unspecified hypothyroidism type    -  Primary    Relevant Medications    SYNTHROID 200 MCG tablet    ADD (attention deficit disorder)        Relevant Medications    methylphenidate (RITALIN) 10 MG tablet    methylphenidate (RITALIN) 10 MG tablet    methylphenidate (RITALIN) 10 MG tablet       Follow-up: Return in about 6 months (around 05/06/2016), or if symptoms worsen or fail to improve, for annual exam,  fasting.  Garnet Koyanagi, DO

## 2015-11-07 NOTE — Progress Notes (Signed)
Pre visit review using our clinic review tool, if applicable. No additional management support is needed unless otherwise documented below in the visit note. 

## 2015-11-10 ENCOUNTER — Other Ambulatory Visit: Payer: Self-pay | Admitting: Internal Medicine

## 2015-11-10 ENCOUNTER — Encounter: Payer: Self-pay | Admitting: Family Medicine

## 2015-11-10 NOTE — Telephone Encounter (Signed)
Ok to refill as before 

## 2015-11-10 NOTE — Telephone Encounter (Signed)
Per 08/15/15 OV: Patient Instructions       Ok to try reducing Doxepin to 1 daily, or alternate 1 with 2 every other day, as needed  Flu vax  ---  Please advise Dr. Annamaria Boots on refill thanks

## 2016-01-10 ENCOUNTER — Other Ambulatory Visit: Payer: Self-pay | Admitting: Family Medicine

## 2016-01-10 NOTE — Telephone Encounter (Signed)
Medication filled to pharmacy as requested.   

## 2016-01-27 DIAGNOSIS — Z6830 Body mass index (BMI) 30.0-30.9, adult: Secondary | ICD-10-CM | POA: Diagnosis not present

## 2016-01-27 DIAGNOSIS — Z1231 Encounter for screening mammogram for malignant neoplasm of breast: Secondary | ICD-10-CM | POA: Diagnosis not present

## 2016-01-27 DIAGNOSIS — Z01419 Encounter for gynecological examination (general) (routine) without abnormal findings: Secondary | ICD-10-CM | POA: Diagnosis not present

## 2016-01-27 DIAGNOSIS — N921 Excessive and frequent menstruation with irregular cycle: Secondary | ICD-10-CM | POA: Diagnosis not present

## 2016-02-06 ENCOUNTER — Other Ambulatory Visit: Payer: Self-pay | Admitting: Family Medicine

## 2016-02-06 DIAGNOSIS — F988 Other specified behavioral and emotional disorders with onset usually occurring in childhood and adolescence: Secondary | ICD-10-CM

## 2016-02-06 MED ORDER — METHYLPHENIDATE HCL 10 MG PO TABS: 10.0000 mg | ORAL_TABLET | Freq: Three times a day (TID) | ORAL | Status: DC

## 2016-02-29 DIAGNOSIS — E039 Hypothyroidism, unspecified: Secondary | ICD-10-CM | POA: Diagnosis not present

## 2016-03-12 ENCOUNTER — Other Ambulatory Visit: Payer: Self-pay | Admitting: Family Medicine

## 2016-03-20 DIAGNOSIS — L739 Follicular disorder, unspecified: Secondary | ICD-10-CM | POA: Diagnosis not present

## 2016-03-20 DIAGNOSIS — D239 Other benign neoplasm of skin, unspecified: Secondary | ICD-10-CM | POA: Diagnosis not present

## 2016-03-24 ENCOUNTER — Other Ambulatory Visit: Payer: Self-pay | Admitting: Internal Medicine

## 2016-03-26 ENCOUNTER — Telehealth: Payer: Self-pay | Admitting: Internal Medicine

## 2016-03-26 NOTE — Telephone Encounter (Signed)
lmtcb x1 for pt. 

## 2016-03-27 NOTE — Telephone Encounter (Signed)
Left message for patient to call back  

## 2016-03-28 NOTE — Telephone Encounter (Signed)
Attempted to contact pt. No answer, no option to leave a message. Will try back.  

## 2016-03-29 ENCOUNTER — Other Ambulatory Visit: Payer: Self-pay | Admitting: *Deleted

## 2016-03-29 MED ORDER — DOXEPIN HCL 10 MG PO CAPS: ORAL_CAPSULE | ORAL | Status: DC

## 2016-03-29 NOTE — Telephone Encounter (Signed)
Attempted to contact pt. Unable to LVM as VM is full.

## 2016-03-30 NOTE — Telephone Encounter (Signed)
Attempted to contact pt. Voicemail is full. Will close message per triage protocol.

## 2016-04-11 ENCOUNTER — Other Ambulatory Visit: Payer: Self-pay | Admitting: Family Medicine

## 2016-05-08 ENCOUNTER — Other Ambulatory Visit: Payer: Self-pay | Admitting: Family Medicine

## 2016-05-08 ENCOUNTER — Other Ambulatory Visit: Payer: Self-pay

## 2016-05-08 DIAGNOSIS — F988 Other specified behavioral and emotional disorders with onset usually occurring in childhood and adolescence: Secondary | ICD-10-CM

## 2016-05-08 MED ORDER — METHYLPHENIDATE HCL 10 MG PO TABS: 10.0000 mg | ORAL_TABLET | Freq: Three times a day (TID) | ORAL | 0 refills | Status: DC

## 2016-06-08 ENCOUNTER — Ambulatory Visit (INDEPENDENT_AMBULATORY_CARE_PROVIDER_SITE_OTHER): Payer: BLUE CROSS/BLUE SHIELD | Admitting: Family Medicine

## 2016-06-08 ENCOUNTER — Encounter: Payer: Self-pay | Admitting: Family Medicine

## 2016-06-08 DIAGNOSIS — F909 Attention-deficit hyperactivity disorder, unspecified type: Secondary | ICD-10-CM | POA: Diagnosis not present

## 2016-06-08 DIAGNOSIS — F988 Other specified behavioral and emotional disorders with onset usually occurring in childhood and adolescence: Secondary | ICD-10-CM

## 2016-06-08 MED ORDER — METHYLPHENIDATE HCL 10 MG PO TABS: 10.0000 mg | ORAL_TABLET | Freq: Three times a day (TID) | ORAL | 0 refills | Status: DC

## 2016-06-08 NOTE — Patient Instructions (Signed)

## 2016-06-08 NOTE — Progress Notes (Signed)
Patient ID: Shelby Warren, female    DOB: 19-May-1966  Age: 50 y.o. MRN: [id number]    Subjective:  Subjective  HPI Shelby Warren presents for f/u add   She is doing well with the meds-- no complaints.    Review of Systems  Constitutional: Negative for activity change, appetite change, fatigue and unexpected weight change.  Respiratory: Negative for cough and shortness of breath.   Cardiovascular: Negative for chest pain and palpitations.  Psychiatric/Behavioral: Negative for behavioral problems and dysphoric mood. The patient is not nervous/anxious.     History Past Medical History:  Diagnosis Date  . ADD (attention deficit disorder)   . Allergic rhinitis   . Asthma   . B12 deficiency   . Hypothyroidism     She has a past surgical history that includes Anal fissure repair; spincture muscle; and lasic eye surgery.   Her family history includes Breast cancer in her paternal grandmother; Diabetes in her maternal grandfather; Thyroid disease in her paternal grandmother.She reports that she has never smoked. She has never used smokeless tobacco. She reports that she drinks alcohol. She reports that she does not use drugs.  Current Outpatient Prescriptions on File Prior to Visit  Medication Sig Dispense Refill  . ACZONE 5 % topical gel     . albuterol (VENTOLIN HFA) 108 (90 BASE) MCG/ACT inhaler INHALE 2 PUFFS BY MOUTH EVERY 4 TO 6 HOURS AS NEEDED 18 each 1  . doxepin (SINEQUAN) 10 MG capsule TAKE 1 TO 3 CAPSULES BY MOUTH AT BEDTIME AS NEEDED FOR HIVES 90 capsule 0  . famotidine (PEPCID) 40 MG tablet TAKE 1 TABLET BY MOUTH TWICE DAILY 60 tablet 5  . fexofenadine (ALLEGRA) 180 MG tablet Take 180 mg by mouth daily.    . montelukast (SINGULAIR) 10 MG tablet TAKE 1 TABLET BY MOUTH AT BEDTIME 30 tablet 5  . SYNTHROID 200 MCG tablet TAKE 1 TABLET BY MOUTH EVERY DAY BEFORE BREAKFAST 90 tablet 3   No current facility-administered medications on file prior to visit.      Objective:    Objective  Physical Exam  Constitutional: She is oriented to person, place, and time. She appears well-developed and well-nourished.  HENT:  Head: Normocephalic and atraumatic.  Eyes: Conjunctivae and EOM are normal.  Neck: Normal range of motion. Neck supple. No JVD present. Carotid bruit is not present. No thyromegaly present.  Cardiovascular: Normal rate, regular rhythm and normal heart sounds.   No murmur heard. Pulmonary/Chest: Effort normal and breath sounds normal. No respiratory distress. She has no wheezes. She has no rales. She exhibits no tenderness.  Musculoskeletal: She exhibits no edema.  Neurological: She is alert and oriented to person, place, and time.  Psychiatric: She has a normal mood and affect. Her behavior is normal.  Nursing note and vitals reviewed.  BP 130/87 (BP Location: Right Arm, Patient Position: Sitting, Cuff Size: Normal)   Pulse 72   Temp 97.9 F (36.6 C) (Oral)   Ht 5\' 7"  (1.702 m)   Wt 191 lb 9.6 oz (86.9 kg)   LMP 06/01/2016   SpO2 99%   BMI 30.01 kg/m  Wt Readings from Last 3 Encounters:  06/08/16 191 lb 9.6 oz (86.9 kg)  11/07/15 197 lb 9.6 oz (89.6 kg)  08/15/15 191 lb 12.8 oz (87 kg)     Lab Results  Component Value Date   WBC 6.0 04/13/2015   HGB 13.4 04/13/2015   HCT 39.0 04/13/2015   PLT 269.0 04/13/2015  GLUCOSE 87 04/13/2015   CHOL 200 07/04/2012   TRIG 77.0 07/04/2012   HDL 61.30 07/04/2012   LDLCALC 123 (H) 07/04/2012   ALT 15 07/04/2012   AST 17 07/04/2012   NA 139 04/13/2015   K 4.5 04/13/2015   CL 104 04/13/2015   CREATININE 0.99 04/13/2015   BUN 16 04/13/2015   CO2 27 04/13/2015   TSH 3.31 10/13/2015    US Soft Tissue Head/neck  Result Date: 05/18/2015 CLINICAL DATA:  Hypothyroidism EXAM: THYROID ULTRASOUND TECHNIQUE: Ultrasound examination of the thyroid gland and adjacent soft tissues was performed. COMPARISON:  None. FINDINGS: Right thyroid lobe Measurements: 2.8 x 1.0 x 0.8 cm. Heterogeneous tissue  without focal nodule. Left thyroid lobe Measurements: 2.7 x 0.8 x 1.0 cm. Heterogeneous tissue without focal nodule. Isthmus Thickness: 1 mm.  No nodules visualized. Lymphadenopathy None visualized. IMPRESSION: Heterogeneous gland without focal nodule. The gland is somewhat small. Electronically Signed   By: Marybelle Killings M.D.   On: 05/18/2015 10:26     Assessment & Plan:  Plan  I am having Ms. Risinger start on methylphenidate and methylphenidate. I am also having her maintain her ACZONE, albuterol, fexofenadine, SYNTHROID, famotidine, doxepin, montelukast, and methylphenidate.  Meds ordered this encounter  Medications  . methylphenidate (RITALIN) 10 MG tablet    Sig: Take 1 tablet (10 mg total) by mouth 3 (three) times daily with meals.    Dispense:  90 tablet    Refill:  0  . methylphenidate (RITALIN) 10 MG tablet    Sig: Take 1 tablet (10 mg total) by mouth 3 (three) times daily with meals.    Dispense:  90 tablet    Refill:  0    Do not fill until July 08, 2016  . methylphenidate (RITALIN) 10 MG tablet    Sig: Take 1 tablet (10 mg total) by mouth 3 (three) times daily with meals.    Dispense:  90 tablet    Refill:  0    Do not fill until November8, 2017    Problem List Items Addressed This Visit    None    Visit Diagnoses    ADD (attention deficit disorder)       Relevant Medications   methylphenidate (RITALIN) 10 MG tablet   methylphenidate (RITALIN) 10 MG tablet   methylphenidate (RITALIN) 10 MG tablet          Stable-- con't meds Follow-up: Return in about 6 months (around 12/06/2016).  Ann Held, DO

## 2016-06-08 NOTE — Progress Notes (Signed)
Pre visit review using our clinic review tool, if applicable. No additional management support is needed unless otherwise documented below in the visit note. 

## 2016-08-14 ENCOUNTER — Ambulatory Visit (INDEPENDENT_AMBULATORY_CARE_PROVIDER_SITE_OTHER): Payer: BLUE CROSS/BLUE SHIELD | Admitting: Internal Medicine

## 2016-08-14 ENCOUNTER — Encounter: Payer: Self-pay | Admitting: Internal Medicine

## 2016-08-14 ENCOUNTER — Ambulatory Visit: Payer: BLUE CROSS/BLUE SHIELD | Admitting: Internal Medicine

## 2016-08-14 DIAGNOSIS — J302 Other seasonal allergic rhinitis: Secondary | ICD-10-CM | POA: Diagnosis not present

## 2016-08-14 DIAGNOSIS — L501 Idiopathic urticaria: Secondary | ICD-10-CM

## 2016-08-14 DIAGNOSIS — J3089 Other allergic rhinitis: Secondary | ICD-10-CM

## 2016-08-14 NOTE — Progress Notes (Signed)
   02/10/15- 50 yo F never smoker followed for urticaria, allergic rhinitis, complicated by hypothyroid, GERD FOLLOWS [id number] well,no hives,allergies good. Doxepin taking 2 or 310 mg every evening has given her significantly better control of urticaria. She was able to stop Zyrtec but continues morning Allegra.  08/15/15- 39 yo F never smoker followed for urticaria, allergic rhinitis, complicated by hypothyroid, GERD Follows for urticaria and allergies. Pt states that she has been doing well. Pt denies any hives, nasal congestion or asthma flares.  No hives in the past year. Using doxepin 20 mg in the evening along with Pepcid, Allegra, Singulair. She accepts a certain amount of daytime tiredness related partly to doxepin and may be partly to thyroid which is being adjusted.  08/14/2016-50 year old female never smoker followed for urticaria, allergic rhinitis, complicated by hypothyroid, GERD FOLLOWS FOR. pt overall feels good since last ov  She takes Zyrtec only based on allergic rhinitis needs but otherwise continues all of her antihistamine medications: Doxepin once or twice each night, Pepcid, Singulair, Allegra. No breakthrough hives.  ROS-see HPI Constitutional:   No-   weight loss, night sweats, fevers, chills, fatigue, lassitude. HEENT:   No-  headaches, difficulty swallowing, tooth/dental problems, sore throat,       No-  sneezing, itching, ear ache, nasal congestion, post nasal drip,  CV:  No-   chest pain, orthopnea, PND, swelling in lower extremities, anasarca, dizziness, palpitations Resp: No-   shortness of breath with exertion or at rest.              No-   productive cough,  No non-productive cough,  No- coughing up of blood.              No-   change in color of mucus.  No- wheezing.   Skin: +HPI GI:  No-   heartburn, indigestion, abdominal pain, nausea, vomiting, GU:  MS:  No-   joint pain or swelling.   Neuro-     nothing unusual Psych:  No- change in mood or affect. No  depression or anxiety(+"stress").  No memory loss.  OBJ- Physical Exam General- Alert, Oriented, Affect-appropriate, Distress- none acute. Appears well Skin- clear Lymphadenopathy- none Head- atraumatic            Eyes- Gross vision intact, PERRLA, conjunctivae and secretions clear            Ears- Hearing, canals-normal            Nose- Clear, no-Septal dev, mucus, polyps, erosion, perforation             Throat- Mallampati II , mucosa clear , drainage- none, tonsils- atrophic Neck- flexible , trachea midline, no stridor , thyroid nl, carotid no bruit Chest - symmetrical excursion , unlabored           Heart/CV- RRR , no murmur , no gallop  , no rub, nl s1 s2                           - JVD- none , edema- none, stasis changes- none, varices- none           Lung- clear to P&A, wheeze- none, cough- none , dullness-none, rub- none           Chest wall-  Abd-  Br/ Gen/ Rectal- Not done, not indicated Extrem- cyanosis- none, clubbing, none, atrophy- none, strength- nl Neuro- grossly intact to observation

## 2016-08-14 NOTE — Assessment & Plan Note (Signed)
She remains on a great deal of antihistamine. I talked this through again with her and suggested that she try dropping an additional medication every 2-3 weeks as tolerated. If she does have resumption of hives, add back the last medicine she dropped off. She has been reluctant to take a chance.

## 2016-08-14 NOTE — Assessment & Plan Note (Signed)
She has yet another antihistamine-Zyrtec, occasionally based on nasal drainage symptoms. I'm surprised that more antihistamine helps given the amount that she takes every day. Exam is unremarkable today not convinced she really has much rhinitis. She can try saline nasal spray as an alternative.

## 2016-08-14 NOTE — Patient Instructions (Signed)
Ok to gradually try coming off your antihistamine medicines as discussed, to see what you still need. You can always start back on the last medicine you dropped, if you do break out.  Flu vax standard

## 2016-08-28 DIAGNOSIS — E039 Hypothyroidism, unspecified: Secondary | ICD-10-CM | POA: Diagnosis not present

## 2016-09-03 ENCOUNTER — Other Ambulatory Visit: Payer: Self-pay | Admitting: Family Medicine

## 2016-09-03 DIAGNOSIS — F988 Other specified behavioral and emotional disorders with onset usually occurring in childhood and adolescence: Secondary | ICD-10-CM

## 2016-09-07 ENCOUNTER — Encounter: Payer: Self-pay | Admitting: Family Medicine

## 2016-09-07 ENCOUNTER — Other Ambulatory Visit: Payer: Self-pay

## 2016-09-07 DIAGNOSIS — F988 Other specified behavioral and emotional disorders with onset usually occurring in childhood and adolescence: Secondary | ICD-10-CM

## 2016-09-07 MED ORDER — METHYLPHENIDATE HCL 10 MG PO TABS: 10.0000 mg | ORAL_TABLET | Freq: Three times a day (TID) | ORAL | 0 refills | Status: DC

## 2016-09-07 NOTE — Telephone Encounter (Signed)
Last seen 06/08/16 Last filled 06/08/16 Sig take 1 tab po tid w/ meals  Please advise  PC

## 2016-09-25 ENCOUNTER — Other Ambulatory Visit: Payer: Self-pay | Admitting: Internal Medicine

## 2016-10-01 HISTORY — PX: OTHER SURGICAL HISTORY: SHX169

## 2016-10-05 ENCOUNTER — Other Ambulatory Visit: Payer: Self-pay | Admitting: Family Medicine

## 2016-10-08 ENCOUNTER — Encounter: Payer: Self-pay | Admitting: Family Medicine

## 2016-10-08 ENCOUNTER — Telehealth: Payer: Self-pay | Admitting: Family Medicine

## 2016-10-08 DIAGNOSIS — F988 Other specified behavioral and emotional disorders with onset usually occurring in childhood and adolescence: Secondary | ICD-10-CM

## 2016-10-09 MED ORDER — METHYLPHENIDATE HCL 10 MG PO TABS: 10.0000 mg | ORAL_TABLET | Freq: Three times a day (TID) | ORAL | 0 refills | Status: DC

## 2016-10-09 NOTE — Telephone Encounter (Signed)
Last seen 06/08/16  Last filled 06/08/16 for   Sig: Sig: Take 1 tablet (10 mg total) by mouth 3 (three) times daily with meals  Last UDS 08/23/14  Last Contract 11/08/15  Please advise  PC

## 2016-10-09 NOTE — Telephone Encounter (Signed)
Refill for 3 months. 

## 2016-10-09 NOTE — Telephone Encounter (Signed)
Rx's for January, February, and March 2018 printed, awaiting MD signature.

## 2016-10-09 NOTE — Telephone Encounter (Signed)
Pt is requesting refill on Ritalin.  Last OV: 06/08/2016 Last Fill: 06/08/2016 #90 and 0RF (Rx's provided for October, November and December 2017) UDS: 10/13/2015 Low risk  Please advise.

## 2016-10-09 NOTE — Telephone Encounter (Signed)
Pt informed via MyChart that Rx's have been placed at front desk for pick up.  

## 2016-11-05 ENCOUNTER — Other Ambulatory Visit: Payer: Self-pay | Admitting: Family Medicine

## 2016-11-05 DIAGNOSIS — E039 Hypothyroidism, unspecified: Secondary | ICD-10-CM

## 2016-11-06 ENCOUNTER — Other Ambulatory Visit: Payer: Self-pay | Admitting: Family Medicine

## 2016-11-06 NOTE — Telephone Encounter (Signed)
Pt. Needs 6 month follow up with Dr. Etter Sjogren around 12/06/2016. Refill sent to pharmacy.  Please Schedule  Pt for follow up.

## 2016-11-07 NOTE — Telephone Encounter (Signed)
Left message on Voicemail @ [phone] for patient to call the office to schedule follow up

## 2016-12-01 ENCOUNTER — Other Ambulatory Visit: Payer: Self-pay | Admitting: Family

## 2016-12-05 ENCOUNTER — Other Ambulatory Visit: Payer: Self-pay | Admitting: Family Medicine

## 2017-01-03 ENCOUNTER — Other Ambulatory Visit: Payer: Self-pay | Admitting: Family Medicine

## 2017-01-04 ENCOUNTER — Encounter: Payer: Self-pay | Admitting: Family Medicine

## 2017-01-04 ENCOUNTER — Ambulatory Visit (INDEPENDENT_AMBULATORY_CARE_PROVIDER_SITE_OTHER): Payer: BLUE CROSS/BLUE SHIELD | Admitting: Family Medicine

## 2017-01-04 VITALS — BP 122/81 | HR 81 | Temp 97.7°F | Wt 191.2 lb

## 2017-01-04 DIAGNOSIS — F988 Other specified behavioral and emotional disorders with onset usually occurring in childhood and adolescence: Secondary | ICD-10-CM | POA: Diagnosis not present

## 2017-01-04 DIAGNOSIS — L700 Acne vulgaris: Secondary | ICD-10-CM | POA: Diagnosis not present

## 2017-01-04 MED ORDER — DOXYCYCLINE HYCLATE 100 MG PO CAPS: 100.0000 mg | ORAL_CAPSULE | Freq: Two times a day (BID) | ORAL | 2 refills | Status: DC

## 2017-01-04 MED ORDER — METHYLPHENIDATE HCL 10 MG PO TABS: 10.0000 mg | ORAL_TABLET | Freq: Three times a day (TID) | ORAL | 0 refills | Status: DC

## 2017-01-04 NOTE — Patient Instructions (Signed)
Acne Acne is a skin problem that causes pimples. Acne occurs when the pores in the skin get blocked. The pores may become infected with bacteria, or they may become red, sore, and swollen. Acne is a common skin problem, especially for teenagers. Acne usually goes away over time. What are the causes? Each pore contains an oil gland. Oil glands make an oily substance that is called sebum. Acne happens when these glands get plugged with sebum, dead skin cells, and dirt. Then, the bacteria that are normally found in the oil glands multiply and cause inflammation. Acne is commonly triggered by changes in your hormones. These hormonal changes can cause the oil glands to get bigger and to make more sebum. Factors that can make acne worse include:  Hormone changes during:  Adolescence.  Women's menstrual cycles.  Pregnancy.  Oil-based cosmetics and hair products.  Harshly scrubbing the skin.  Strong soaps.  Stress.  Hormone problems that are due to certain diseases.  Long or oily hair rubbing against the skin.  Certain medicines.  Pressure from headbands, backpacks, or shoulder pads.  Exposure to certain oils and chemicals. What increases the risk? This condition is more likely to develop in:  Teenagers.  People who have a family history of acne. What are the signs or symptoms? Acne often occurs on the face, neck, chest, and upper back. Symptoms include:  Small, red bumps (pimples or papules).  Whiteheads.  Blackheads.  Small, pus-filled pimples (pustules).  Big, red pimples or pustules that feel tender. More severe acne can cause:  An infected area that contains a collection of pus (abscess).  Hard, painful, fluid-filled sacs (cysts).  Scars. How is this diagnosed? This condition is diagnosed with a medical history and physical exam. Blood tests may also be done. How is this treated? Treatment for this condition can vary depending on the severity of your acne.  Treatment may include:  Creams and lotions that prevent oil glands from clogging.  Creams and lotions that treat or prevent infections and inflammation.  Antibiotic medicines that are applied to the skin or taken as a pill.  Pills that decrease sebum production.  Birth control pills.  Light or laser treatments.  Surgery.  Injections of medicine into the affected areas.  Chemicals that cause peeling of the skin. Your health care provider will also recommend the best way to take care of your skin. Good skin care is the most important part of treatment. Follow these instructions at home: Skin care  Take care of your skin as told by your health care provider. You may be told to do these things:  Wash your skin gently at least two times each day, as well as:  After you exercise.  Before you go to bed.  Use mild soap.  Apply a water-based skin moisturizer after you wash your skin.  Use a sunscreen or sunblock with SPF 30 or greater. This is especially important if you are using acne medicines.  Choose cosmetics that will not plug your oil glands (are noncomedogenic). Medicines   Take over-the-counter and prescription medicines only as told by your health care provider.  If you were prescribed an antibiotic medicine, apply or take it as told by your health care provider. Do not stop taking the antibiotic even if your condition improves. General instructions   Keep your hair clean and off of your face. If you have oily hair, shampoo your hair regularly or daily.  Avoid leaning your chin or forehead against your  hands.  Avoid wearing tight headbands or hats.  Avoid picking or squeezing your pimples. That can make your acne worse and cause scarring.  Keep all follow-up visits as told by your health care provider. This is important.  Shave gently and only when necessary.  Keep a food journal to figure out if any foods are linked with your acne. Contact a health care  provider if:  Your acne is not better after eight weeks.  Your acne gets worse.  You have a large area of skin that is red or tender.  You think that you are having side effects from any acne medicine. This information is not intended to replace advice given to you by your health care provider. Make sure you discuss any questions you have with your health care provider. Document Released: 09/14/2000 Document Revised: 05/18/2016 Document Reviewed: 11/24/2014 Elsevier Interactive Patient Education  2017 [employer].

## 2017-01-04 NOTE — Progress Notes (Signed)
Pre visit review using our clinic review tool, if applicable. No additional management support is needed unless otherwise documented below in the visit note. 

## 2017-01-04 NOTE — Progress Notes (Signed)
Patient ID: Shelby Warren, female   DOB: 03-30-1966, 51 y.o.   MRN: [id number]   Subjective:   I acted as a Education administrator for [employer], DO. Raiford Noble, [place]  Patient ID: Shelby Warren, female    DOB: 1966/02/06, 51 y.o.   MRN: [id number]  Chief Complaint  Patient presents with  . Follow-up    ADD F/U. Patient states that the medication appears tobe working well with no new cncerns noted at this time.    HPI  Patient is in today for a medication follow up. Patient has a Hx of ADD, GERD, hypothyroidism. Patient states the medication appears to be working well. No acute concerns noted at this time.   Patient Care Team: Ann Held, DO as PCP - General Elayne Snare, MD as Consulting Physician (Endocrinology)   Past Medical History:  Diagnosis Date  . ADD (attention deficit disorder)   . Allergic rhinitis   . Asthma   . B12 deficiency   . Hypothyroidism     Past Surgical History:  Procedure Laterality Date  . ANAL FISSURE REPAIR    . lasic eye surgery    . spincture muscle      Family History  Problem Relation Age of Onset  . Breast cancer Paternal Grandmother   . Thyroid disease Paternal Grandmother   . Diabetes Maternal Grandfather     Social History   Social History  . Marital status: Married    Spouse name: N/A  . Number of children: 2  . Years of education: N/A   Occupational History  .  [place] History Main Topics  . Smoking status: Never Smoker  . Smokeless tobacco: Never Used  . Alcohol use Yes     Comment: occasional   . Drug use: No  . Sexual activity: Not on file   Other Topics Concern  . Not on file   Social History Narrative  . No narrative on file    Outpatient Medications Prior to Visit  Medication Sig Dispense Refill  . ACZONE 5 % topical gel     . albuterol (VENTOLIN HFA) 108 (90 BASE) MCG/ACT inhaler INHALE 2 PUFFS BY MOUTH EVERY 4 TO 6 HOURS AS NEEDED 18 each 1  . doxepin (SINEQUAN) 10 MG  capsule TAKE 1-3 CAPSULES BY MOUTH EVERY NIGHT AT BEDTIME AS NEEDED FOR HIVES 90 capsule 0  . famotidine (PEPCID) 40 MG tablet TAKE 1 TABLET BY MOUTH TWICE DAILY 60 tablet 3  . fexofenadine (ALLEGRA) 180 MG tablet Take 180 mg by mouth daily.    . montelukast (SINGULAIR) 10 MG tablet TAKE 1 TABLET BY MOUTH AT BEDTIME 30 tablet 0  . SYNTHROID 200 MCG tablet TAKE 1 TABLET BY MOUTH EVERY DAY BEFORE BREAKFAST 90 tablet 0  . methylphenidate (RITALIN) 10 MG tablet Take 1 tablet (10 mg total) by mouth 3 (three) times daily with meals. 90 tablet 0  . methylphenidate (RITALIN) 10 MG tablet Take 1 tablet (10 mg total) by mouth 3 (three) times daily with meals. 90 tablet 0  . methylphenidate (RITALIN) 10 MG tablet Take 1 tablet (10 mg total) by mouth 3 (three) times daily with meals. 90 tablet 0   No facility-administered medications prior to visit.     Allergies  Allergen Reactions  . Penicillins     Review of Systems  Constitutional: Negative for chills, fever and malaise/fatigue.  HENT: Negative for congestion and hearing loss.  Eyes: Negative for discharge.  Respiratory: Negative for cough, sputum production and shortness of breath.   Cardiovascular: Negative for chest pain, palpitations and leg swelling.  Gastrointestinal: Negative for abdominal pain, blood in stool, constipation, diarrhea, heartburn, nausea and vomiting.  Genitourinary: Negative for dysuria, frequency, hematuria and urgency.  Musculoskeletal: Negative for back pain, falls and myalgias.  Skin: Negative for rash.  Neurological: Negative for dizziness, sensory change, loss of consciousness, weakness and headaches.  Endo/Heme/Allergies: Negative for environmental allergies. Does not bruise/bleed easily.  Psychiatric/Behavioral: Negative for depression and suicidal ideas. The patient is not nervous/anxious and does not have insomnia.        Objective:    Physical Exam  Constitutional: She is oriented to person, place, and  time. She appears well-developed and well-nourished.  HENT:  Head: Normocephalic and atraumatic.  Eyes: Conjunctivae and EOM are normal.  Neck: Normal range of motion. Neck supple. No JVD present. Carotid bruit is not present. No thyromegaly present.  Cardiovascular: Normal rate, regular rhythm and normal heart sounds.   No murmur heard. Pulmonary/Chest: Effort normal and breath sounds normal. No respiratory distress. She has no wheezes. She has no rales. She exhibits no tenderness.  Musculoskeletal: She exhibits no edema.  Neurological: She is alert and oriented to person, place, and time.  Skin: Rash noted. Rash is papular.     Psychiatric: She has a normal mood and affect.  Nursing note and vitals reviewed.   BP 122/81 (BP Location: Left Arm, Patient Position: Sitting, Cuff Size: Normal)   Pulse 81   Temp 97.7 F (36.5 C) (Oral)   Wt 191 lb 3.2 oz (86.7 kg)   SpO2 100% Comment: RA  BMI 29.95 kg/m  Wt Readings from Last 3 Encounters:  01/04/17 191 lb 3.2 oz (86.7 kg)  08/14/16 193 lb 3.2 oz (87.6 kg)  06/08/16 191 lb 9.6 oz (86.9 kg)   BP Readings from Last 3 Encounters:  01/04/17 122/81  08/14/16 122/70  06/08/16 130/87     Immunization History  Administered Date(s) Administered  . Influenza Whole 07/28/2008  . Influenza,inj,Quad PF,36+ Mos 07/01/2014, 08/15/2015  . Tdap 04/01/2014    Health Maintenance  Topic Date Due  . MAMMOGRAM  12/17/2016  . COLONOSCOPY  12/17/2016  . HIV Screening  06/08/2017 (Originally 12/17/1981)  . INFLUENZA VACCINE  05/01/2017  . PAP SMEAR  12/06/2017  . TETANUS/TDAP  04/01/2024    Lab Results  Component Value Date   WBC 6.0 04/13/2015   HGB 13.4 04/13/2015   HCT 39.0 04/13/2015   PLT 269.0 04/13/2015   GLUCOSE 87 04/13/2015   CHOL 200 07/04/2012   TRIG 77.0 07/04/2012   HDL 61.30 07/04/2012   LDLCALC 123 (H) 07/04/2012   ALT 15 07/04/2012   AST 17 07/04/2012   NA 139 04/13/2015   K 4.5 04/13/2015   CL 104 04/13/2015    CREATININE 0.99 04/13/2015   BUN 16 04/13/2015   CO2 27 04/13/2015   TSH 3.31 10/13/2015    Lab Results  Component Value Date   TSH 3.31 10/13/2015   Lab Results  Component Value Date   WBC 6.0 04/13/2015   HGB 13.4 04/13/2015   HCT 39.0 04/13/2015   MCV 90.7 04/13/2015   PLT 269.0 04/13/2015   Lab Results  Component Value Date   NA 139 04/13/2015   K 4.5 04/13/2015   CO2 27 04/13/2015   GLUCOSE 87 04/13/2015   BUN 16 04/13/2015   CREATININE 0.99 04/13/2015   BILITOT 1.0  07/04/2012   ALKPHOS 26 (L) 07/04/2012   AST 17 07/04/2012   ALT 15 07/04/2012   PROT 7.4 07/04/2012   ALBUMIN 4.1 07/04/2012   CALCIUM 9.3 04/13/2015   GFR 63.54 04/13/2015   Lab Results  Component Value Date   CHOL 200 07/04/2012   Lab Results  Component Value Date   HDL 61.30 07/04/2012   Lab Results  Component Value Date   LDLCALC 123 (H) 07/04/2012   Lab Results  Component Value Date   TRIG 77.0 07/04/2012   Lab Results  Component Value Date   CHOLHDL 3 07/04/2012   No results found for: HGBA1C       Assessment & Plan:   Problem List Items Addressed This Visit      Unprioritized   Acne vulgaris - Primary   Relevant Medications   doxycycline (VIBRAMYCIN) 100 MG capsule    Other Visit Diagnoses    Attention deficit disorder, unspecified hyperactivity presence       Relevant Medications   methylphenidate (RITALIN) 10 MG tablet   methylphenidate (RITALIN) 10 MG tablet   methylphenidate (RITALIN) 10 MG tablet      I am having Ms. Curb start on doxycycline. I am also having her maintain her ACZONE, albuterol, fexofenadine, doxepin, SYNTHROID, famotidine, montelukast, methylphenidate, methylphenidate, and methylphenidate.  Meds ordered this encounter  Medications  . methylphenidate (RITALIN) 10 MG tablet    Sig: Take 1 tablet (10 mg total) by mouth 3 (three) times daily with meals.    Dispense:  90 tablet    Refill:  0    Do not fill until April 2018  .  methylphenidate (RITALIN) 10 MG tablet    Sig: Take 1 tablet (10 mg total) by mouth 3 (three) times daily with meals.    Dispense:  90 tablet    Refill:  0    Do not fill until May 2018  . methylphenidate (RITALIN) 10 MG tablet    Sig: Take 1 tablet (10 mg total) by mouth 3 (three) times daily with meals.    Dispense:  90 tablet    Refill:  0    Do not fill until June 2018  . doxycycline (VIBRAMYCIN) 100 MG capsule    Sig: Take 1 capsule (100 mg total) by mouth 2 (two) times daily.    Dispense:  60 capsule    Refill:  2    CMA served as scribe during this visit. History, Physical and Plan performed by medical provider. Documentation and orders reviewed and attested to.  Ann Held, DO

## 2017-01-11 ENCOUNTER — Telehealth: Payer: Self-pay | Admitting: *Deleted

## 2017-01-11 NOTE — Telephone Encounter (Signed)
Received fax from [place] stating that patient ins approved the Shingrix vaccine.  Left message on machine for patient to call to make her an appointment with the nurse to get vaccine.

## 2017-01-28 ENCOUNTER — Encounter: Payer: Self-pay | Admitting: Family Medicine

## 2017-01-28 ENCOUNTER — Other Ambulatory Visit: Payer: Self-pay | Admitting: Family Medicine

## 2017-01-28 DIAGNOSIS — M25569 Pain in unspecified knee: Secondary | ICD-10-CM

## 2017-01-28 NOTE — Telephone Encounter (Signed)
Ok to put referral in for ortho for knee pain

## 2017-01-29 DIAGNOSIS — S83241A Other tear of medial meniscus, current injury, right knee, initial encounter: Secondary | ICD-10-CM | POA: Diagnosis not present

## 2017-01-31 ENCOUNTER — Other Ambulatory Visit: Payer: Self-pay | Admitting: Family Medicine

## 2017-01-31 DIAGNOSIS — E039 Hypothyroidism, unspecified: Secondary | ICD-10-CM

## 2017-02-04 DIAGNOSIS — M25561 Pain in right knee: Secondary | ICD-10-CM | POA: Diagnosis not present

## 2017-02-08 DIAGNOSIS — M659 Synovitis and tenosynovitis, unspecified: Secondary | ICD-10-CM | POA: Diagnosis not present

## 2017-02-08 DIAGNOSIS — M94261 Chondromalacia, right knee: Secondary | ICD-10-CM | POA: Diagnosis not present

## 2017-02-08 DIAGNOSIS — S83281A Other tear of lateral meniscus, current injury, right knee, initial encounter: Secondary | ICD-10-CM | POA: Diagnosis not present

## 2017-02-08 DIAGNOSIS — S83241A Other tear of medial meniscus, current injury, right knee, initial encounter: Secondary | ICD-10-CM | POA: Diagnosis not present

## 2017-02-08 DIAGNOSIS — G8918 Other acute postprocedural pain: Secondary | ICD-10-CM | POA: Diagnosis not present

## 2017-02-08 DIAGNOSIS — Y998 Other external cause status: Secondary | ICD-10-CM | POA: Diagnosis not present

## 2017-02-14 DIAGNOSIS — S83241D Other tear of medial meniscus, current injury, right knee, subsequent encounter: Secondary | ICD-10-CM | POA: Diagnosis not present

## 2017-02-14 DIAGNOSIS — S83281D Other tear of lateral meniscus, current injury, right knee, subsequent encounter: Secondary | ICD-10-CM | POA: Diagnosis not present

## 2017-03-04 DIAGNOSIS — R319 Hematuria, unspecified: Secondary | ICD-10-CM | POA: Diagnosis not present

## 2017-03-04 DIAGNOSIS — N390 Urinary tract infection, site not specified: Secondary | ICD-10-CM | POA: Diagnosis not present

## 2017-03-04 DIAGNOSIS — Z01419 Encounter for gynecological examination (general) (routine) without abnormal findings: Secondary | ICD-10-CM | POA: Diagnosis not present

## 2017-03-04 DIAGNOSIS — Z1231 Encounter for screening mammogram for malignant neoplasm of breast: Secondary | ICD-10-CM | POA: Diagnosis not present

## 2017-03-04 DIAGNOSIS — Z13820 Encounter for screening for osteoporosis: Secondary | ICD-10-CM | POA: Diagnosis not present

## 2017-03-04 DIAGNOSIS — Z6829 Body mass index (BMI) 29.0-29.9, adult: Secondary | ICD-10-CM | POA: Diagnosis not present

## 2017-03-12 DIAGNOSIS — S83241D Other tear of medial meniscus, current injury, right knee, subsequent encounter: Secondary | ICD-10-CM | POA: Diagnosis not present

## 2017-03-12 DIAGNOSIS — S83281D Other tear of lateral meniscus, current injury, right knee, subsequent encounter: Secondary | ICD-10-CM | POA: Diagnosis not present

## 2017-03-14 DIAGNOSIS — Z1211 Encounter for screening for malignant neoplasm of colon: Secondary | ICD-10-CM | POA: Diagnosis not present

## 2017-03-14 DIAGNOSIS — K5904 Chronic idiopathic constipation: Secondary | ICD-10-CM | POA: Diagnosis not present

## 2017-03-14 DIAGNOSIS — K219 Gastro-esophageal reflux disease without esophagitis: Secondary | ICD-10-CM | POA: Diagnosis not present

## 2017-03-22 ENCOUNTER — Encounter: Payer: Self-pay | Admitting: Family Medicine

## 2017-03-22 DIAGNOSIS — E039 Hypothyroidism, unspecified: Secondary | ICD-10-CM

## 2017-03-22 MED ORDER — SYNTHROID 175 MCG PO TABS
175.0000 ug | ORAL_TABLET | Freq: Every day | ORAL | 2 refills | Status: DC
Start: 2017-03-22 — End: 2017-06-10

## 2017-03-22 NOTE — Telephone Encounter (Signed)
Relation to PT:ELMR Call back number: [phone] (M)  Reason for call:  Patient checking on the status of mychart message please advise patient directly, patient states she's leaving town tomorrow.

## 2017-03-22 NOTE — Telephone Encounter (Signed)
TSH from Dr. Collene Mares received- 03/14/2017- 0.220. Per Dr. Etter Sjogren, needs Synthroid 131mcg daily, Rx sent to [facility] in [place], [place]. Pt informed to call office to schedule labs to be completed in 2 months.

## 2017-03-30 ENCOUNTER — Telehealth: Payer: Self-pay | Admitting: Internal Medicine

## 2017-04-01 NOTE — Telephone Encounter (Signed)
Rx has been sent in per CY. Enough refills have been given until her appointment in November.

## 2017-04-01 NOTE — Telephone Encounter (Signed)
Ok to refill # 90 with 1 refill  Please make sure she has appointment in November as planned

## 2017-04-01 NOTE — Telephone Encounter (Signed)
Received refill request for Doxepin 10mg .  Last filled on 09/25/16 for #90 with 0 refills.  Pt last seen on 08/14/16 and advised to f/u in 1 year.   Dr. Annamaria Boots please advise on refill. Thanks.   Allergies  Allergen Reactions  . Penicillins    Current Outpatient Prescriptions on File Prior to Visit  Medication Sig Dispense Refill  . ACZONE 5 % topical gel     . albuterol (VENTOLIN HFA) 108 (90 BASE) MCG/ACT inhaler INHALE 2 PUFFS BY MOUTH EVERY 4 TO 6 HOURS AS NEEDED 18 each 1  . doxepin (SINEQUAN) 10 MG capsule TAKE 1-3 CAPSULES BY MOUTH EVERY NIGHT AT BEDTIME AS NEEDED FOR HIVES 90 capsule 0  . doxycycline (VIBRAMYCIN) 100 MG capsule Take 1 capsule (100 mg total) by mouth 2 (two) times daily. 60 capsule 2  . famotidine (PEPCID) 40 MG tablet TAKE 1 TABLET BY MOUTH TWICE DAILY 60 tablet 3  . fexofenadine (ALLEGRA) 180 MG tablet Take 180 mg by mouth daily.    . methylphenidate (RITALIN) 10 MG tablet Take 1 tablet (10 mg total) by mouth 3 (three) times daily with meals. 90 tablet 0  . methylphenidate (RITALIN) 10 MG tablet Take 1 tablet (10 mg total) by mouth 3 (three) times daily with meals. 90 tablet 0  . methylphenidate (RITALIN) 10 MG tablet Take 1 tablet (10 mg total) by mouth 3 (three) times daily with meals. 90 tablet 0  . montelukast (SINGULAIR) 10 MG tablet TAKE 1 TABLET BY MOUTH AT BEDTIME 30 tablet 3  . SYNTHROID 175 MCG tablet Take 1 tablet (175 mcg total) by mouth daily before breakfast. 30 tablet 2   No current facility-administered medications on file prior to visit.

## 2017-04-09 DIAGNOSIS — S83241D Other tear of medial meniscus, current injury, right knee, subsequent encounter: Secondary | ICD-10-CM | POA: Diagnosis not present

## 2017-04-09 DIAGNOSIS — S83281D Other tear of lateral meniscus, current injury, right knee, subsequent encounter: Secondary | ICD-10-CM | POA: Diagnosis not present

## 2017-04-10 ENCOUNTER — Other Ambulatory Visit: Payer: Self-pay | Admitting: Family Medicine

## 2017-04-12 ENCOUNTER — Other Ambulatory Visit: Payer: Self-pay | Admitting: Family Medicine

## 2017-04-12 DIAGNOSIS — F988 Other specified behavioral and emotional disorders with onset usually occurring in childhood and adolescence: Secondary | ICD-10-CM

## 2017-04-12 MED ORDER — METHYLPHENIDATE HCL 10 MG PO TABS: 10.0000 mg | ORAL_TABLET | Freq: Three times a day (TID) | ORAL | 0 refills | Status: DC

## 2017-05-09 ENCOUNTER — Other Ambulatory Visit (INDEPENDENT_AMBULATORY_CARE_PROVIDER_SITE_OTHER): Payer: BLUE CROSS/BLUE SHIELD

## 2017-05-09 DIAGNOSIS — E039 Hypothyroidism, unspecified: Secondary | ICD-10-CM

## 2017-05-09 LAB — TSH: TSH: 0.4 u[IU]/mL (ref 0.35–4.50)

## 2017-05-20 DIAGNOSIS — R3121 Asymptomatic microscopic hematuria: Secondary | ICD-10-CM | POA: Diagnosis not present

## 2017-05-29 ENCOUNTER — Encounter: Payer: Self-pay | Admitting: Family Medicine

## 2017-05-29 DIAGNOSIS — D125 Benign neoplasm of sigmoid colon: Secondary | ICD-10-CM | POA: Diagnosis not present

## 2017-05-29 DIAGNOSIS — K635 Polyp of colon: Secondary | ICD-10-CM | POA: Diagnosis not present

## 2017-05-29 DIAGNOSIS — K6389 Other specified diseases of intestine: Secondary | ICD-10-CM | POA: Diagnosis not present

## 2017-05-29 DIAGNOSIS — Z1211 Encounter for screening for malignant neoplasm of colon: Secondary | ICD-10-CM | POA: Diagnosis not present

## 2017-06-06 ENCOUNTER — Telehealth: Payer: Self-pay | Admitting: *Deleted

## 2017-06-06 NOTE — Telephone Encounter (Signed)
Received results from Surgical Pathology Report; forwarded to provider/SLS 09/06

## 2017-06-10 ENCOUNTER — Other Ambulatory Visit: Payer: Self-pay | Admitting: Family Medicine

## 2017-06-13 ENCOUNTER — Other Ambulatory Visit: Payer: Self-pay | Admitting: Family Medicine

## 2017-06-13 NOTE — Telephone Encounter (Signed)
Faxed 30d Singulair with note to pharm to have pt call to sched appt for this month/per last visit note/thx dmf

## 2017-06-24 DIAGNOSIS — R3121 Asymptomatic microscopic hematuria: Secondary | ICD-10-CM | POA: Diagnosis not present

## 2017-06-24 DIAGNOSIS — R3129 Other microscopic hematuria: Secondary | ICD-10-CM | POA: Diagnosis not present

## 2017-06-25 ENCOUNTER — Other Ambulatory Visit: Payer: Self-pay | Admitting: Family Medicine

## 2017-06-25 DIAGNOSIS — L700 Acne vulgaris: Secondary | ICD-10-CM

## 2017-06-30 ENCOUNTER — Other Ambulatory Visit: Payer: Self-pay | Admitting: Family Medicine

## 2017-07-03 DIAGNOSIS — R3121 Asymptomatic microscopic hematuria: Secondary | ICD-10-CM | POA: Diagnosis not present

## 2017-07-11 ENCOUNTER — Other Ambulatory Visit: Payer: Self-pay | Admitting: Family Medicine

## 2017-07-15 ENCOUNTER — Telehealth: Payer: Self-pay | Admitting: Family Medicine

## 2017-07-15 DIAGNOSIS — F988 Other specified behavioral and emotional disorders with onset usually occurring in childhood and adolescence: Secondary | ICD-10-CM

## 2017-07-15 MED ORDER — METHYLPHENIDATE HCL 10 MG PO TABS: 10.0000 mg | ORAL_TABLET | Freq: Three times a day (TID) | ORAL | 0 refills | Status: DC

## 2017-07-15 NOTE — Telephone Encounter (Signed)
Ok to refill but she is due ov

## 2017-07-15 NOTE — Telephone Encounter (Signed)
Pt is requesting refill on Ritalin 10mg .  Last OV: 01/04/2017  Last Fill: 04/12/2017 #90 and 0RF (For July- September 2018) UDS: 10/13/2015 Low risk (due at time of pick up)  Please advise.

## 2017-07-15 NOTE — Telephone Encounter (Signed)
Pt informed via MyChart that Rx has been placed at front desk for pick up at her convenience.  

## 2017-07-17 ENCOUNTER — Other Ambulatory Visit: Payer: BLUE CROSS/BLUE SHIELD

## 2017-07-17 DIAGNOSIS — F988 Other specified behavioral and emotional disorders with onset usually occurring in childhood and adolescence: Secondary | ICD-10-CM

## 2017-07-25 LAB — PAIN MGMT, PROFILE 8 W/CONF, U
6 Acetylmorphine: NEGATIVE ng/mL (ref <10)
ALCOHOL METABOLITES: POSITIVE ng/mL — AB (ref <500)
AMPHETAMINES: NEGATIVE ng/mL (ref <500)
BENZODIAZEPINES: NEGATIVE ng/mL (ref <100)
BUPRENORPHINE, URINE: NEGATIVE ng/mL (ref <5)
COCAINE METABOLITE: NEGATIVE ng/mL (ref <150)
CREATININE: 46.5 mg/dL
Ethyl Glucuronide (ETG): 655 ng/mL — ABNORMAL HIGH (ref <500)
Ethyl Sulfate (ETS): 196 ng/mL — ABNORMAL HIGH (ref <100)
MDMA: NEGATIVE ng/mL (ref <500)
Marijuana Metabolite: NEGATIVE ng/mL (ref <20)
OXIDANT: NEGATIVE ug/mL (ref <200)
Opiates: NEGATIVE ng/mL (ref <100)
Oxycodone: NEGATIVE ng/mL (ref <100)
PH: 5.37 (ref 4.5–9.0)

## 2017-07-31 ENCOUNTER — Other Ambulatory Visit: Payer: Self-pay | Admitting: Family Medicine

## 2017-08-07 ENCOUNTER — Other Ambulatory Visit: Payer: Self-pay | Admitting: Family Medicine

## 2017-08-08 ENCOUNTER — Other Ambulatory Visit: Payer: Self-pay

## 2017-08-08 ENCOUNTER — Ambulatory Visit: Payer: BLUE CROSS/BLUE SHIELD | Admitting: Family Medicine

## 2017-08-08 ENCOUNTER — Encounter: Payer: Self-pay | Admitting: Family Medicine

## 2017-08-08 VITALS — BP 126/88 | Temp 98.2°F | Ht 66.6 in | Wt 190.0 lb

## 2017-08-08 DIAGNOSIS — F988 Other specified behavioral and emotional disorders with onset usually occurring in childhood and adolescence: Secondary | ICD-10-CM

## 2017-08-08 DIAGNOSIS — L700 Acne vulgaris: Secondary | ICD-10-CM

## 2017-08-08 DIAGNOSIS — J302 Other seasonal allergic rhinitis: Secondary | ICD-10-CM

## 2017-08-08 MED ORDER — METHYLPHENIDATE HCL 10 MG PO TABS: 10.0000 mg | ORAL_TABLET | Freq: Three times a day (TID) | ORAL | 0 refills | Status: DC

## 2017-08-08 MED ORDER — DOXYCYCLINE HYCLATE 100 MG PO CAPS: ORAL_CAPSULE | ORAL | 5 refills | Status: DC

## 2017-08-08 MED ORDER — SYNTHROID 175 MCG PO TABS: ORAL_TABLET | ORAL | 1 refills | Status: DC

## 2017-08-08 MED ORDER — MONTELUKAST SODIUM 10 MG PO TABS: 10.0000 mg | ORAL_TABLET | Freq: Every day | ORAL | 1 refills | Status: DC

## 2017-08-08 MED ORDER — DOXYCYCLINE HYCLATE 100 MG PO CAPS: ORAL_CAPSULE | ORAL | 0 refills | Status: DC

## 2017-08-08 NOTE — Patient Instructions (Signed)
Acne Acne is a skin problem that causes pimples. Acne occurs when the pores in the skin get blocked. The pores may become infected with bacteria, or they may become red, sore, and swollen. Acne is a common skin problem, especially for teenagers. Acne usually goes away over time. What are the causes? Each pore contains an oil gland. Oil glands make an oily substance that is called sebum. Acne happens when these glands get plugged with sebum, dead skin cells, and dirt. Then, the bacteria that are normally found in the oil glands multiply and cause inflammation. Acne is commonly triggered by changes in your hormones. These hormonal changes can cause the oil glands to get bigger and to make more sebum. Factors that can make acne worse include:  Hormone changes during: ? Adolescence. ? Women's menstrual cycles. ? Pregnancy.  Oil-based cosmetics and hair products.  Harshly scrubbing the skin.  Strong soaps.  Stress.  Hormone problems that are due to certain diseases.  Long or oily hair rubbing against the skin.  Certain medicines.  Pressure from headbands, backpacks, or shoulder pads.  Exposure to certain oils and chemicals.  What increases the risk? This condition is more likely to develop in:  Teenagers.  People who have a family history of acne.  What are the signs or symptoms? Acne often occurs on the face, neck, chest, and upper back. Symptoms include:  Small, red bumps (pimples or papules).  Whiteheads.  Blackheads.  Small, pus-filled pimples (pustules).  Big, red pimples or pustules that feel tender.  More severe acne can cause:  An infected area that contains a collection of pus (abscess).  Hard, painful, fluid-filled sacs (cysts).  Scars.  How is this diagnosed? This condition is diagnosed with a medical history and physical exam. Blood tests may also be done. How is this treated? Treatment for this condition can vary depending on the severity of your  acne. Treatment may include:  Creams and lotions that prevent oil glands from clogging.  Creams and lotions that treat or prevent infections and inflammation.  Antibiotic medicines that are applied to the skin or taken as a pill.  Pills that decrease sebum production.  Birth control pills.  Light or laser treatments.  Surgery.  Injections of medicine into the affected areas.  Chemicals that cause peeling of the skin.  Your health care provider will also recommend the best way to take care of your skin. Good skin care is the most important part of treatment. Follow these instructions at home: Skin care Take care of your skin as told by your health care provider. You may be told to do these things:  Wash your skin gently at least two times each day, as well as: ? After you exercise. ? Before you go to bed.  Use mild soap.  Apply a water-based skin moisturizer after you wash your skin.  Use a sunscreen or sunblock with SPF 30 or greater. This is especially important if you are using acne medicines.  Choose cosmetics that will not plug your oil glands (are noncomedogenic).  Medicines  Take over-the-counter and prescription medicines only as told by your health care provider.  If you were prescribed an antibiotic medicine, apply or take it as told by your health care provider. Do not stop taking the antibiotic even if your condition improves. General instructions  Keep your hair clean and off of your face. If you have oily hair, shampoo your hair regularly or daily.  Avoid leaning your chin or   forehead against your hands.  Avoid wearing tight headbands or hats.  Avoid picking or squeezing your pimples. That can make your acne worse and cause scarring.  Keep all follow-up visits as told by your health care provider. This is important.  Shave gently and only when necessary.  Keep a food journal to figure out if any foods are linked with your acne. Contact a health  care provider if:  Your acne is not better after eight weeks.  Your acne gets worse.  You have a large area of skin that is red or tender.  You think that you are having side effects from any acne medicine. This information is not intended to replace advice given to you by your health care provider. Make sure you discuss any questions you have with your health care provider. Document Released: 09/14/2000 Document Revised: 05/18/2016 Document Reviewed: 11/24/2014 Elsevier Interactive Patient Education  2018 Elsevier Inc.  

## 2017-08-08 NOTE — Progress Notes (Signed)
Patient ID: Shelby Warren, female    DOB: 1966/05/27  Age: 51 y.o. MRN: [id number]    Subjective:  Subjective  HPI Shelby Warren presents for f/u ritalin.  Shelby Warren is doing well.    Review of Systems  Constitutional: Negative for appetite change, diaphoresis, fatigue and unexpected weight change.  Eyes: Negative for pain, redness and visual disturbance.  Respiratory: Negative for cough, chest tightness, shortness of breath and wheezing.   Cardiovascular: Negative for chest pain, palpitations and leg swelling.  Endocrine: Negative for cold intolerance, heat intolerance, polydipsia, polyphagia and polyuria.  Genitourinary: Negative for difficulty urinating, dysuria and frequency.  Neurological: Negative for dizziness, light-headedness, numbness and headaches.    History Past Medical History:  Diagnosis Date  . ADD (attention deficit disorder)   . Allergic rhinitis   . Asthma   . B12 deficiency   . Hypothyroidism     Shelby Warren has a past surgical history that includes Anal fissure repair; spincture muscle; and lasic eye surgery.   Her family history includes Breast cancer in her paternal grandmother; Diabetes in her maternal grandfather; Thyroid disease in her paternal grandmother.Shelby Warren reports that  has never smoked. Shelby Warren has never used smokeless tobacco. Shelby Warren reports that Shelby Warren drinks alcohol. Shelby Warren reports that Shelby Warren does not use drugs.  Current Outpatient Medications on File Prior to Visit  Medication Sig Dispense Refill  . ACZONE 5 % topical gel     . albuterol (VENTOLIN HFA) 108 (90 BASE) MCG/ACT inhaler INHALE 2 PUFFS BY MOUTH EVERY 4 TO 6 HOURS AS NEEDED 18 each 1  . doxepin (SINEQUAN) 10 MG capsule TAKE 1-3 CAPSULES BY MOUTH EVERY NIGHT AT BEDTIME AS NEEDED FOR HIVES 90 capsule 4  . famotidine (PEPCID) 40 MG tablet TAKE 1 TABLET BY MOUTH TWICE DAILY 60 tablet 0  . fexofenadine (ALLEGRA) 180 MG tablet Take 180 mg by mouth daily.     No current facility-administered medications on file prior  to visit.      Objective:  Objective  Physical Exam  Constitutional: Shelby Warren is oriented to person, place, and time. Shelby Warren appears well-developed and well-nourished.  HENT:  Head: Normocephalic and atraumatic.  Eyes: Conjunctivae and EOM are normal.  Neck: Normal range of motion. Neck supple. No JVD present. Carotid bruit is not present. No thyromegaly present.  Cardiovascular: Normal rate, regular rhythm and normal heart sounds.  No murmur heard. Pulmonary/Chest: Effort normal and breath sounds normal. No respiratory distress. Shelby Warren has no wheezes. Shelby Warren has no rales. Shelby Warren exhibits no tenderness.  Musculoskeletal: Shelby Warren exhibits no edema.  Neurological: Shelby Warren is alert and oriented to person, place, and time.  Psychiatric: Shelby Warren has a normal mood and affect.  Nursing note and vitals reviewed.  BP 126/88   Temp 98.2 F (36.8 C) (Oral)   Ht 5' 6.6" (1.692 m)   Wt 190 lb (86.2 kg)   LMP 08/05/2017 (Exact Date)   SpO2 98%   BMI 30.12 kg/m  Wt Readings from Last 3 Encounters:  08/08/17 190 lb (86.2 kg)  01/04/17 191 lb 3.2 oz (86.7 kg)  08/14/16 193 lb 3.2 oz (87.6 kg)     Lab Results  Component Value Date   WBC 6.0 04/13/2015   HGB 13.4 04/13/2015   HCT 39.0 04/13/2015   PLT 269.0 04/13/2015   GLUCOSE 87 04/13/2015   CHOL 200 07/04/2012   TRIG 77.0 07/04/2012   HDL 61.30 07/04/2012   LDLCALC 123 (H) 07/04/2012   ALT 15 07/04/2012   AST 17 07/04/2012  NA 139 04/13/2015   K 4.5 04/13/2015   CL 104 04/13/2015   CREATININE 0.99 04/13/2015   BUN 16 04/13/2015   CO2 27 04/13/2015   TSH 0.40 05/09/2017    US Soft Tissue Head/neck  Result Date: 05/18/2015 CLINICAL DATA:  Hypothyroidism EXAM: THYROID ULTRASOUND TECHNIQUE: Ultrasound examination of the thyroid gland and adjacent soft tissues was performed. COMPARISON:  None. FINDINGS: Right thyroid lobe Measurements: 2.8 x 1.0 x 0.8 cm. Heterogeneous tissue without focal nodule. Left thyroid lobe Measurements: 2.7 x 0.8 x 1.0 cm.  Heterogeneous tissue without focal nodule. Isthmus Thickness: 1 mm.  No nodules visualized. Lymphadenopathy None visualized. IMPRESSION: Heterogeneous gland without focal nodule. The gland is somewhat small. Electronically Signed   By: Marybelle Killings M.D.   On: 05/18/2015 10:26     Assessment & Plan:  Plan  I have changed Georgina Peer B. Pang's montelukast. I am also having her maintain her ACZONE, albuterol, fexofenadine, doxepin, famotidine, methylphenidate, doxycycline, and SYNTHROID.  Meds ordered this encounter  Medications  . methylphenidate (RITALIN) 10 MG tablet    Sig: Take 1 tablet (10 mg total) 3 (three) times daily with meals by mouth.    Dispense:  90 tablet    Refill:  0    May fill after 10/08/2016  . doxycycline (VIBRAMYCIN) 100 MG capsule    Sig: TAKE 1 CAPSULE(100 MG) BY MOUTH TWICE DAILY    Dispense:  60 capsule    Refill:  5  . montelukast (SINGULAIR) 10 MG tablet    Sig: Take 1 tablet (10 mg total) at bedtime by mouth.    Dispense:  90 tablet    Refill:  1    Please advise pt to sched appt for this month/thanks  . SYNTHROID 175 MCG tablet    Sig: TAKE 1 TABLET(175 MCG) BY MOUTH DAILY BEFORE BREAKFAST    Dispense:  90 tablet    Refill:  1    Problem List Items Addressed This Visit      Unprioritized   Acne vulgaris   Relevant Medications   doxycycline (VIBRAMYCIN) 100 MG capsule    Other Visit Diagnoses    Seasonal allergies    -  Primary   Attention deficit disorder, unspecified hyperactivity presence       Relevant Medications   methylphenidate (RITALIN) 10 MG tablet      Follow-up: Return in about 6 months (around 02/05/2018), or if symptoms worsen or fail to improve.  Ann Held, DO

## 2017-08-14 ENCOUNTER — Encounter: Payer: Self-pay | Admitting: Internal Medicine

## 2017-08-14 ENCOUNTER — Ambulatory Visit: Payer: BLUE CROSS/BLUE SHIELD | Admitting: Internal Medicine

## 2017-08-14 DIAGNOSIS — J3089 Other allergic rhinitis: Secondary | ICD-10-CM | POA: Diagnosis not present

## 2017-08-14 DIAGNOSIS — J302 Other seasonal allergic rhinitis: Secondary | ICD-10-CM | POA: Diagnosis not present

## 2017-08-14 DIAGNOSIS — J4520 Mild intermittent asthma, uncomplicated: Secondary | ICD-10-CM

## 2017-08-14 DIAGNOSIS — Z23 Encounter for immunization: Secondary | ICD-10-CM

## 2017-08-14 DIAGNOSIS — L700 Acne vulgaris: Secondary | ICD-10-CM

## 2017-08-14 DIAGNOSIS — L501 Idiopathic urticaria: Secondary | ICD-10-CM

## 2017-08-14 NOTE — Assessment & Plan Note (Signed)
Much better control now.  She had been on multiple type I and type II antihistamines but has been able to back off.  If she thinks she is feeling the beginning of a high of episode she does increase antihistamine control briefly, rather than waiting to see if she would develop full-blown hives again.  No clear etiology ever established. She will be able to get routine meds filled through her primary physician.  As I slowed down some, at our allergy program has been discontinued, I told her she could either continue to see me as needed, or switch to a full-time allergist.

## 2017-08-14 NOTE — Assessment & Plan Note (Signed)
Excellent control using Singulair with rare need for rescue inhaler and no need for maintenance controller.

## 2017-08-14 NOTE — Progress Notes (Signed)
HPI female never smoker followed for urticaria, allergic rhinitis, complicated by hypothyroid, GERD   ------------------------------------------------------------------------  08/14/2016-51 year old female never smoker followed for urticaria, allergic rhinitis, complicated by hypothyroid, GERD FOLLOWS FOR. pt overall feels good since last ov  She takes Zyrtec only based on allergic rhinitis needs but otherwise continues all of her antihistamine medications: Doxepin once or twice each night, Pepcid, Singulair, Allegra. No breakthrough hives.  08/14/17- 51 year old female never smoker followed for urticaria, allergic rhinitis, complicated by hypothyroid, GERD ---Pt is doing well over all. She would like the flu shot today. No urticaria in a long time.  She has reduce doxepin to once daily, takes Zyrtec only occasionally if she senses that hives might be starting.  Continues Singulair for asthma but has used rescue inhaler only one time in 8 months with no sleep disturbance.  ROS-see HPI + = positive Constitutional:   No-   weight loss, night sweats, fevers, chills, fatigue, lassitude. HEENT:   No-  headaches, difficulty swallowing, tooth/dental problems, sore throat,       No-  sneezing, itching, ear ache, nasal congestion, post nasal drip,  CV:  No-   chest pain, orthopnea, PND, swelling in lower extremities, anasarca, dizziness, palpitations Resp: No-   shortness of breath with exertion or at rest.              No-   productive cough,  No non-productive cough,  No- coughing up of blood.              No-   change in color of mucus.  No- wheezing.   Skin: +HPI GI:  No-   heartburn, indigestion, abdominal pain, nausea, vomiting, GU:  MS:  No-   joint pain or swelling.   Neuro-     nothing unusual Psych:  No- change in mood or affect. No depression or anxiety(+"stress").  No memory loss.  OBJ- Physical Exam General- Alert, Oriented, Affect-appropriate, Distress- none acute. Appears  well Skin- clear Lymphadenopathy- none Head- atraumatic            Eyes- Gross vision intact, PERRLA, conjunctivae and secretions clear            Ears- Hearing, canals-normal            Nose- Clear, no-Septal dev, mucus, polyps, erosion, perforation             Throat- Mallampati II , mucosa clear , drainage- none, tonsils- atrophic Neck- flexible , trachea midline, no stridor , thyroid nl, carotid no bruit Chest - symmetrical excursion , unlabored           Heart/CV- RRR , no murmur , no gallop  , no rub, nl s1 s2                           - JVD- none , edema- none, stasis changes- none, varices- none           Lung- clear to P&A, wheeze- none, cough- none , dullness-none, rub- none           Chest wall-  Abd-  Br/ Gen/ Rectal- Not done, not indicated Extrem- cyanosis- none, clubbing, none, atrophy- none, strength- nl Neuro- grossly intact to observation

## 2017-08-14 NOTE — Assessment & Plan Note (Signed)
Comfortable control with occasional antihistamine now.

## 2017-08-14 NOTE — Patient Instructions (Signed)
Fine to use the doxepin and Zyrtec intermittently as needed for the hives. Glad you are doing  Better.  Flu vax- standard  Please call if we can help

## 2017-08-14 NOTE — Assessment & Plan Note (Signed)
She continues doxycycline once daily for maintenance control, followed by dermatology

## 2017-09-04 ENCOUNTER — Other Ambulatory Visit: Payer: Self-pay | Admitting: Family Medicine

## 2017-09-14 ENCOUNTER — Other Ambulatory Visit: Payer: Self-pay | Admitting: Internal Medicine

## 2017-10-13 ENCOUNTER — Other Ambulatory Visit: Payer: Self-pay | Admitting: Internal Medicine

## 2017-10-14 NOTE — Telephone Encounter (Signed)
Ok refill total 6 months 

## 2017-10-14 NOTE — Telephone Encounter (Signed)
CY Please advise on refill. Thanks.  

## 2017-10-23 ENCOUNTER — Encounter: Payer: Self-pay | Admitting: Family Medicine

## 2017-10-30 NOTE — Telephone Encounter (Signed)
Dr Carollee Herter-- Please advise what labs pt needs and if ok to send shingrix Rx to pharnacy since we have not given pt her first vaccine and this will be a new start?

## 2017-10-31 ENCOUNTER — Other Ambulatory Visit: Payer: Self-pay | Admitting: Family Medicine

## 2017-10-31 DIAGNOSIS — E039 Hypothyroidism, unspecified: Secondary | ICD-10-CM

## 2017-10-31 NOTE — Telephone Encounter (Signed)
I put the order in for thyroid labs We don't have the shingrix right now -- unless we got a lot in lately

## 2017-11-12 ENCOUNTER — Other Ambulatory Visit: Payer: Self-pay | Admitting: Family Medicine

## 2017-11-12 ENCOUNTER — Encounter: Payer: Self-pay | Admitting: Family Medicine

## 2017-11-12 DIAGNOSIS — F988 Other specified behavioral and emotional disorders with onset usually occurring in childhood and adolescence: Secondary | ICD-10-CM

## 2017-11-12 DIAGNOSIS — Z79899 Other long term (current) drug therapy: Secondary | ICD-10-CM

## 2017-11-12 MED ORDER — METHYLPHENIDATE HCL 10 MG PO TABS: 10.0000 mg | ORAL_TABLET | Freq: Three times a day (TID) | ORAL | 0 refills | Status: DC

## 2017-11-12 NOTE — Telephone Encounter (Signed)
Patient notified that she has to come in and do UDS.  She will be in tomorrow.

## 2017-11-12 NOTE — Addendum Note (Signed)
Addended by: Kem Boroughs D on: 11/12/2017 03:39 PM   Modules accepted: Orders

## 2017-11-12 NOTE — Telephone Encounter (Signed)
Patient requesting refill for Ritalin  Database ran and is on your desk for review.  Last filled per database:  10/15/17 Last written: 08/08/17 Last ov: 08/08/17 Next ov: none Contract: 07/17/18 UDS: due now

## 2017-11-13 ENCOUNTER — Other Ambulatory Visit (INDEPENDENT_AMBULATORY_CARE_PROVIDER_SITE_OTHER): Payer: BLUE CROSS/BLUE SHIELD

## 2017-11-13 DIAGNOSIS — E039 Hypothyroidism, unspecified: Secondary | ICD-10-CM

## 2017-11-13 DIAGNOSIS — Z79899 Other long term (current) drug therapy: Secondary | ICD-10-CM | POA: Diagnosis not present

## 2017-11-13 DIAGNOSIS — F988 Other specified behavioral and emotional disorders with onset usually occurring in childhood and adolescence: Secondary | ICD-10-CM | POA: Diagnosis not present

## 2017-11-13 NOTE — Telephone Encounter (Signed)
See phone note

## 2017-11-15 LAB — PAIN MGMT, PROFILE 8 W/CONF, U
6 ACETYLMORPHINE: NEGATIVE ng/mL (ref <10)
AMPHETAMINES: NEGATIVE ng/mL (ref <500)
Alcohol Metabolites: NEGATIVE ng/mL (ref <500)
BUPRENORPHINE, URINE: NEGATIVE ng/mL (ref <5)
Benzodiazepines: NEGATIVE ng/mL (ref <100)
Cocaine Metabolite: NEGATIVE ng/mL (ref <150)
Creatinine: 22 mg/dL
MDMA: NEGATIVE ng/mL (ref <500)
Marijuana Metabolite: NEGATIVE ng/mL (ref <20)
OXYCODONE: NEGATIVE ng/mL (ref <100)
Opiates: NEGATIVE ng/mL (ref <100)
Oxidant: NEGATIVE ug/mL (ref <200)
PH: 6.02 (ref 4.5–9.0)

## 2017-11-15 LAB — THYROID PANEL WITH TSH
Free Thyroxine Index: 3.6 (ref 1.4–3.8)
T3 UPTAKE: 27 % (ref 22–35)
T4 TOTAL: 13.2 ug/dL — AB (ref 5.1–11.9)
TSH: 0.41 mIU/L

## 2017-11-26 DIAGNOSIS — L719 Rosacea, unspecified: Secondary | ICD-10-CM | POA: Diagnosis not present

## 2017-11-26 DIAGNOSIS — L821 Other seborrheic keratosis: Secondary | ICD-10-CM | POA: Diagnosis not present

## 2017-11-26 DIAGNOSIS — D229 Melanocytic nevi, unspecified: Secondary | ICD-10-CM | POA: Diagnosis not present

## 2017-12-10 ENCOUNTER — Other Ambulatory Visit: Payer: Self-pay | Admitting: Family Medicine

## 2017-12-10 DIAGNOSIS — F988 Other specified behavioral and emotional disorders with onset usually occurring in childhood and adolescence: Secondary | ICD-10-CM

## 2017-12-11 NOTE — Telephone Encounter (Signed)
Requesting: Ritalin Contract:07/17/17 UDS:11/13/17 low risk Last Visit:08/08/17 Next Visit: none Last Refill: 11/12/17  Please Advise

## 2017-12-12 ENCOUNTER — Encounter: Payer: Self-pay | Admitting: Family Medicine

## 2017-12-12 MED ORDER — METHYLPHENIDATE HCL 10 MG PO TABS: 10.0000 mg | ORAL_TABLET | Freq: Three times a day (TID) | ORAL | 0 refills | Status: DC

## 2018-01-13 ENCOUNTER — Other Ambulatory Visit: Payer: Self-pay | Admitting: Family Medicine

## 2018-01-13 DIAGNOSIS — F988 Other specified behavioral and emotional disorders with onset usually occurring in childhood and adolescence: Secondary | ICD-10-CM

## 2018-01-15 ENCOUNTER — Encounter: Payer: Self-pay | Admitting: Family Medicine

## 2018-01-15 NOTE — Telephone Encounter (Signed)
Copied from [place] [id number]. Topic: Quick Communication - Rx Refill/Question >> Jan 15, 2018  2:10 PM Synthia Innocent wrote: Medication: methylphenidate (RITALIN) 10 MG tablet  Has the patient contacted their pharmacy? Yes.   (Agent: If no, request that the patient contact the pharmacy for the refill.) Preferred Pharmacy (with phone number or street name): [facility] in [place]: Please be advised that RX refills may take up to 3 business days. We ask that you follow-up with your pharmacy.

## 2018-01-15 NOTE — Telephone Encounter (Signed)
Patient requesting ritalin  Database ran 11/12/17 and is media  Last written: 12/12/17 Last ov: 08/08/17 Next ov:  none Contract: 11/13/18 UDS: 05/13/18

## 2018-01-15 NOTE — Telephone Encounter (Signed)
Ritalin 10 mg tablet refill request  LOV 08/08/17 with Dr. Carollee Herter  [facility] [address], [place], - [address] [place]

## 2018-01-16 ENCOUNTER — Telehealth: Payer: Self-pay | Admitting: *Deleted

## 2018-01-16 ENCOUNTER — Encounter: Payer: Self-pay | Admitting: Family Medicine

## 2018-01-16 ENCOUNTER — Other Ambulatory Visit: Payer: Self-pay | Admitting: Family Medicine

## 2018-01-16 DIAGNOSIS — F988 Other specified behavioral and emotional disorders with onset usually occurring in childhood and adolescence: Secondary | ICD-10-CM

## 2018-01-16 MED ORDER — METHYLPHENIDATE HCL 10 MG PO TABS: 10.0000 mg | ORAL_TABLET | Freq: Three times a day (TID) | ORAL | 0 refills | Status: DC

## 2018-01-16 NOTE — Telephone Encounter (Signed)
Shelby Warren-- there is a refill encounter from 01/13/18 that has also been routed to Dr Carollee Herter. Can you ask her about this when it's a good time.Marland KitchenMarland Kitchen

## 2018-01-16 NOTE — Telephone Encounter (Signed)
Copied from [place] [id number]. Topic: Quick Communication - Rx Refill/Question >> Jan 15, 2018  2:10 PM Synthia Innocent wrote: Medication: methylphenidate (RITALIN) 10 MG tablet  Has the patient contacted their pharmacy? Yes.   (Agent: If no, request that the patient contact the pharmacy for the refill.) Preferred Pharmacy (with phone number or street name): [facility] in [place]: Please be advised that RX refills may take up to 3 business days. We ask that you follow-up with your pharmacy. >> Jan 16, 2018  9:31 AM Percell Belt A wrote: Pt called again about this refill.  She was asking if it would be done today.  She stated she is totally out and put request in on Monday

## 2018-01-16 NOTE — Telephone Encounter (Signed)
done

## 2018-01-16 NOTE — Telephone Encounter (Signed)
See previous refill encounter from 01/13/18.

## 2018-01-16 NOTE — Telephone Encounter (Signed)
Requesting:Ritalin  Contract:07/17/17 UDS:11/13/17 low risk Last Visit:08/08/17 Next Visit:none with pcp Last Refill:12/12/17   No discrepancies  Please Advise

## 2018-01-20 NOTE — Telephone Encounter (Signed)
Left message on machine that medication was sent in. 

## 2018-02-03 ENCOUNTER — Encounter: Payer: Self-pay | Admitting: Family Medicine

## 2018-02-03 DIAGNOSIS — L719 Rosacea, unspecified: Secondary | ICD-10-CM | POA: Diagnosis not present

## 2018-02-03 DIAGNOSIS — L709 Acne, unspecified: Secondary | ICD-10-CM | POA: Diagnosis not present

## 2018-02-05 ENCOUNTER — Other Ambulatory Visit: Payer: Self-pay | Admitting: Family Medicine

## 2018-02-11 ENCOUNTER — Ambulatory Visit: Payer: BLUE CROSS/BLUE SHIELD | Admitting: Family Medicine

## 2018-02-11 ENCOUNTER — Encounter: Payer: Self-pay | Admitting: Family Medicine

## 2018-02-11 VITALS — BP 120/86 | HR 76 | Temp 98.9°F | Resp 16 | Ht 66.0 in | Wt 191.8 lb

## 2018-02-11 DIAGNOSIS — E039 Hypothyroidism, unspecified: Secondary | ICD-10-CM | POA: Diagnosis not present

## 2018-02-11 DIAGNOSIS — F988 Other specified behavioral and emotional disorders with onset usually occurring in childhood and adolescence: Secondary | ICD-10-CM

## 2018-02-11 MED ORDER — METHYLPHENIDATE HCL 10 MG PO TABS: 10.0000 mg | ORAL_TABLET | Freq: Three times a day (TID) | ORAL | 0 refills | Status: DC

## 2018-02-11 NOTE — Progress Notes (Signed)
Patient ID: Shelby Warren, female   DOB: Dec 05, 1965, 52 y.o.   MRN: [id number]     Subjective:  I acted as a Education administrator for Dr. Carollee Herter.  Guerry Bruin, [place]   Patient ID: Shelby Warren, female    DOB: [birth year], 52 y.o.   MRN: [id number]  Chief Complaint  Patient presents with  . ADD  . Hypothyroidism    HPI  Patient is in today for follow up ADD and thyroid.  The symptoms she had before are back and she wonders if it is the thyroid.  No palpitations,  No chest pain, no sob or wheezing.       Patient Care Team: Carollee Herter, Alferd Apa, DO as PCP - General Elayne Snare, MD as Consulting Physician (Endocrinology)   Past Medical History:  Diagnosis Date  . ADD (attention deficit disorder)   . Allergic rhinitis   . Asthma   . B12 deficiency   . Hypothyroidism     Past Surgical History:  Procedure Laterality Date  . ANAL FISSURE REPAIR    . lasic eye surgery    . spincture muscle      Family History  Problem Relation Age of Onset  . Breast cancer Paternal Grandmother   . Thyroid disease Paternal Grandmother   . Diabetes Maternal Grandfather     Social History   Socioeconomic History  . Marital status: Married    Spouse name: Not on file  . Number of children: 2  . Years of education: Not on file  . Highest education level: Not on file  Occupational History    Employer: [employer]  . Occupation: Agricultural engineer  Social Needs  . Financial resource strain: Not on file  . Food insecurity:    Worry: Not on file    Inability: Not on file  . Transportation needs:    Medical: Not on file    Non-medical: Not on file  Tobacco Use  . Smoking status: Never Smoker  . Smokeless tobacco: Never Used  Substance and Sexual Activity  . Alcohol use: Yes    Comment: occasional   . Drug use: No  . Sexual activity: Not on file  Lifestyle  . Physical activity:    Days per week: Not on file    Minutes per session: Not on file  . Stress: Not on file  Relationships  . Social  connections:    Talks on phone: Not on file    Gets together: Not on file    Attends religious service: Not on file    Active member of club or organization: Not on file    Attends meetings of clubs or organizations: Not on file    Relationship status: Not on file  . Intimate partner violence:    Fear of current or ex partner: Not on file    Emotionally abused: Not on file    Physically abused: Not on file    Forced sexual activity: Not on file  Other Topics Concern  . Not on file  Social History Narrative  . Not on file    Outpatient Medications Prior to Visit  Medication Sig Dispense Refill  . ACZONE 5 % topical gel     . albuterol (VENTOLIN HFA) 108 (90 BASE) MCG/ACT inhaler INHALE 2 PUFFS BY MOUTH EVERY 4 TO 6 HOURS AS NEEDED 18 each 1  . doxepin (SINEQUAN) 10 MG capsule TAKE 1-3 CAPSULES BY MOUTH EVERY NIGHT AT BEDTIME AS NEEDED FOR HIVES 90 capsule 5  .  famotidine (PEPCID) 40 MG tablet TAKE 1 TABLET BY MOUTH TWICE DAILY 60 tablet 5  . fexofenadine (ALLEGRA) 180 MG tablet Take 180 mg by mouth daily.    . minocycline (MINOCIN,DYNACIN) 50 MG capsule TK ONE C PO BID  2  . montelukast (SINGULAIR) 10 MG tablet Take 1 tablet (10 mg total) at bedtime by mouth. 90 tablet 1  . SYNTHROID 175 MCG tablet TAKE 1 TABLET(175 MCG) BY MOUTH DAILY BEFORE BREAKFAST 90 tablet 0  . methylphenidate (RITALIN) 10 MG tablet Take 1 tablet (10 mg total) by mouth 3 (three) times daily with meals. 90 tablet 0  . doxycycline (VIBRAMYCIN) 100 MG capsule TAKE 1 CAPSULE(100 MG) BY MOUTH TWICE DAILY 60 capsule 5   No facility-administered medications prior to visit.     Allergies  Allergen Reactions  . Penicillins     Review of Systems  Constitutional: Negative for fever and malaise/fatigue.  HENT: Negative for congestion.   Eyes: Negative for blurred vision.  Respiratory: Negative for cough and shortness of breath.   Cardiovascular: Negative for chest pain, palpitations and leg swelling.    Gastrointestinal: Negative for vomiting.  Musculoskeletal: Negative for back pain.  Skin: Negative for rash.  Neurological: Negative for loss of consciousness and headaches.       Objective:    Physical Exam  Constitutional: She is oriented to person, place, and time. She appears well-developed and well-nourished.  HENT:  Head: Normocephalic and atraumatic.  Eyes: Conjunctivae and EOM are normal.  Neck: Normal range of motion. Neck supple. No JVD present. Carotid bruit is not present. No thyromegaly present.  Cardiovascular: Normal rate, regular rhythm and normal heart sounds.  No murmur heard. Pulmonary/Chest: Effort normal and breath sounds normal. No respiratory distress. She has no wheezes. She has no rales. She exhibits no tenderness.  Musculoskeletal: She exhibits no edema.  Neurological: She is alert and oriented to person, place, and time.  Psychiatric: She has a normal mood and affect.  Nursing note and vitals reviewed.   BP 120/86 (BP Location: Left Arm, Cuff Size: Large)   Pulse 76   Temp 98.9 F (37.2 C) (Oral)   Resp 16   Ht 5\' 6"  (1.676 m)   Wt 191 lb 12.8 oz (87 kg)   LMP 02/11/2018   SpO2 98%   BMI 30.96 kg/m  Wt Readings from Last 3 Encounters:  02/11/18 191 lb 12.8 oz (87 kg)  08/14/17 190 lb (86.2 kg)  08/08/17 190 lb (86.2 kg)   BP Readings from Last 3 Encounters:  02/11/18 120/86  08/14/17 126/78  08/08/17 126/88     Immunization History  Administered Date(s) Administered  . Influenza Split 07/15/2017  . Influenza Whole 07/28/2008  . Influenza,inj,Quad PF,6+ Mos 07/01/2014, 08/15/2015, 08/14/2017  . Tdap 04/01/2014    Health Maintenance  Topic Date Due  . HIV Screening  12/17/1981  . MAMMOGRAM  12/17/2016  . PAP SMEAR  12/06/2017  . INFLUENZA VACCINE  05/01/2018  . TETANUS/TDAP  04/01/2024  . COLONOSCOPY  05/30/2027    Lab Results  Component Value Date   WBC 6.0 04/13/2015   HGB 13.4 04/13/2015   HCT 39.0 04/13/2015   PLT  269.0 04/13/2015   GLUCOSE 87 04/13/2015   CHOL 200 07/04/2012   TRIG 77.0 07/04/2012   HDL 61.30 07/04/2012   LDLCALC 123 (H) 07/04/2012   ALT 15 07/04/2012   AST 17 07/04/2012   NA 139 04/13/2015   K 4.5 04/13/2015   CL 104 04/13/2015  CREATININE 0.99 04/13/2015   BUN 16 04/13/2015   CO2 27 04/13/2015   TSH 0.41 11/13/2017    Lab Results  Component Value Date   TSH 0.41 11/13/2017   Lab Results  Component Value Date   WBC 6.0 04/13/2015   HGB 13.4 04/13/2015   HCT 39.0 04/13/2015   MCV 90.7 04/13/2015   PLT 269.0 04/13/2015   Lab Results  Component Value Date   NA 139 04/13/2015   K 4.5 04/13/2015   CO2 27 04/13/2015   GLUCOSE 87 04/13/2015   BUN 16 04/13/2015   CREATININE 0.99 04/13/2015   BILITOT 1.0 07/04/2012   ALKPHOS 26 (L) 07/04/2012   AST 17 07/04/2012   ALT 15 07/04/2012   PROT 7.4 07/04/2012   ALBUMIN 4.1 07/04/2012   CALCIUM 9.3 04/13/2015   GFR 63.54 04/13/2015   Lab Results  Component Value Date   CHOL 200 07/04/2012   Lab Results  Component Value Date   HDL 61.30 07/04/2012   Lab Results  Component Value Date   LDLCALC 123 (H) 07/04/2012   Lab Results  Component Value Date   TRIG 77.0 07/04/2012   Lab Results  Component Value Date   CHOLHDL 3 07/04/2012   No results found for: HGBA1C       Assessment & Plan:   Problem List Items Addressed This Visit      Unprioritized   Hypothyroidism - Primary   Relevant Orders   Thyroid Panel With TSH    Other Visit Diagnoses    Attention deficit disorder, unspecified hyperactivity presence       Relevant Medications   methylphenidate (RITALIN) 10 MG tablet   methylphenidate (RITALIN) 10 MG tablet      I have discontinued Georgina Peer B. Mundie's doxycycline. I am also having her start on methylphenidate. Additionally, I am having her maintain her ACZONE, albuterol, fexofenadine, montelukast, famotidine, doxepin, SYNTHROID, minocycline, and methylphenidate.  Meds ordered this  encounter  Medications  . DISCONTD: methylphenidate (RITALIN) 10 MG tablet    Sig: Take 1 tablet (10 mg total) by mouth 3 (three) times daily with meals.    Dispense:  90 tablet    Refill:  0  . methylphenidate (RITALIN) 10 MG tablet    Sig: Take 1 tablet (10 mg total) by mouth 3 (three) times daily with meals.    Dispense:  90 tablet    Refill:  0    Do not fill until July 2019  . methylphenidate (RITALIN) 10 MG tablet    Sig: Take 1 tablet (10 mg total) by mouth 3 (three) times daily with meals.    Dispense:  90 tablet    Refill:  0    Do not fill until June 2019    CMA served as Education administrator during this visit. History, Physical and Plan performed by medical provider. Documentation and orders reviewed and attested to.  Ann Held, DO

## 2018-02-11 NOTE — Patient Instructions (Signed)

## 2018-02-12 LAB — THYROID PANEL WITH TSH
Free Thyroxine Index: 3.4 (ref 1.4–3.8)
T3 Uptake: 28 % (ref 22–35)
T4, Total: 12.2 ug/dL — ABNORMAL HIGH (ref 5.1–11.9)
TSH: 1.13 m[IU]/L

## 2018-03-11 ENCOUNTER — Other Ambulatory Visit: Payer: Self-pay | Admitting: Family Medicine

## 2018-03-11 ENCOUNTER — Encounter: Payer: Self-pay | Admitting: Family Medicine

## 2018-03-11 DIAGNOSIS — F988 Other specified behavioral and emotional disorders with onset usually occurring in childhood and adolescence: Secondary | ICD-10-CM

## 2018-03-11 DIAGNOSIS — R41840 Attention and concentration deficit: Secondary | ICD-10-CM

## 2018-03-11 NOTE — Telephone Encounter (Signed)
She should have rx at pharmacy

## 2018-03-11 NOTE — Telephone Encounter (Signed)
It looks like we sent only 2 rxs in and it was one regular and one that had fill July 2019.  Can you send her one in for this month?

## 2018-03-17 DIAGNOSIS — Z01419 Encounter for gynecological examination (general) (routine) without abnormal findings: Secondary | ICD-10-CM | POA: Diagnosis not present

## 2018-03-17 DIAGNOSIS — Z6830 Body mass index (BMI) 30.0-30.9, adult: Secondary | ICD-10-CM | POA: Diagnosis not present

## 2018-03-17 DIAGNOSIS — Z1231 Encounter for screening mammogram for malignant neoplasm of breast: Secondary | ICD-10-CM | POA: Diagnosis not present

## 2018-03-19 ENCOUNTER — Other Ambulatory Visit: Payer: Self-pay | Admitting: Obstetrics and Gynecology

## 2018-03-19 DIAGNOSIS — R928 Other abnormal and inconclusive findings on diagnostic imaging of breast: Secondary | ICD-10-CM

## 2018-03-21 ENCOUNTER — Other Ambulatory Visit: Payer: Self-pay | Admitting: Obstetrics and Gynecology

## 2018-03-21 ENCOUNTER — Ambulatory Visit
Admission: RE | Admit: 2018-03-21 | Discharge: 2018-03-21 | Disposition: A | Discharge status: Home / Self Care | Payer: BLUE CROSS/BLUE SHIELD | Source: Ambulatory Visit | Attending: Obstetrics and Gynecology | Admitting: Obstetrics and Gynecology

## 2018-03-21 DIAGNOSIS — R928 Other abnormal and inconclusive findings on diagnostic imaging of breast: Secondary | ICD-10-CM

## 2018-03-21 DIAGNOSIS — R922 Inconclusive mammogram: Secondary | ICD-10-CM | POA: Diagnosis not present

## 2018-03-21 DIAGNOSIS — N6489 Other specified disorders of breast: Secondary | ICD-10-CM

## 2018-03-21 DIAGNOSIS — N651 Disproportion of reconstructed breast: Secondary | ICD-10-CM | POA: Diagnosis not present

## 2018-03-26 ENCOUNTER — Other Ambulatory Visit: Payer: Self-pay | Admitting: Obstetrics and Gynecology

## 2018-03-26 ENCOUNTER — Ambulatory Visit
Admission: RE | Admit: 2018-03-26 | Discharge: 2018-03-26 | Disposition: A | Discharge status: Home / Self Care | Payer: BLUE CROSS/BLUE SHIELD | Source: Ambulatory Visit | Attending: Obstetrics and Gynecology | Admitting: Obstetrics and Gynecology

## 2018-03-26 DIAGNOSIS — N6011 Diffuse cystic mastopathy of right breast: Secondary | ICD-10-CM | POA: Diagnosis not present

## 2018-03-26 DIAGNOSIS — N6489 Other specified disorders of breast: Secondary | ICD-10-CM

## 2018-04-06 ENCOUNTER — Other Ambulatory Visit: Payer: Self-pay | Admitting: Family Medicine

## 2018-04-14 ENCOUNTER — Encounter: Payer: Self-pay | Admitting: Family Medicine

## 2018-04-14 ENCOUNTER — Other Ambulatory Visit: Payer: Self-pay | Admitting: Family Medicine

## 2018-04-14 DIAGNOSIS — F988 Other specified behavioral and emotional disorders with onset usually occurring in childhood and adolescence: Secondary | ICD-10-CM

## 2018-04-14 MED ORDER — METHYLPHENIDATE HCL 10 MG PO TABS: 10.0000 mg | ORAL_TABLET | Freq: Three times a day (TID) | ORAL | 0 refills | Status: DC

## 2018-04-14 NOTE — Telephone Encounter (Signed)
Requesting: Ritalin 10mg  tid Contract: 2013 UDS: 11/13/17 Last OV: 02/11/18 Next Ov: 08/14/18 Last refill: 02/11/18, #90, 0RF Database: no discrepancies found Please advise.

## 2018-05-08 ENCOUNTER — Other Ambulatory Visit: Payer: Self-pay | Admitting: Family Medicine

## 2018-05-09 ENCOUNTER — Other Ambulatory Visit: Payer: Self-pay | Admitting: Family Medicine

## 2018-05-14 ENCOUNTER — Other Ambulatory Visit: Payer: Self-pay | Admitting: Family Medicine

## 2018-05-14 DIAGNOSIS — F988 Other specified behavioral and emotional disorders with onset usually occurring in childhood and adolescence: Secondary | ICD-10-CM

## 2018-05-15 ENCOUNTER — Encounter: Payer: Self-pay | Admitting: Family Medicine

## 2018-05-15 MED ORDER — METHYLPHENIDATE HCL 10 MG PO TABS: 10.0000 mg | ORAL_TABLET | Freq: Three times a day (TID) | ORAL | 0 refills | Status: DC

## 2018-05-15 NOTE — Telephone Encounter (Signed)
Refill request for Ritalin 10mg .  Last OV: 03/11/2018 Last Fill: 04/14/2018 #90 and 0RF UDS: 11/13/2017 Low risk

## 2018-06-10 ENCOUNTER — Encounter: Payer: Self-pay | Admitting: Family Medicine

## 2018-06-11 ENCOUNTER — Other Ambulatory Visit: Payer: Self-pay | Admitting: Family Medicine

## 2018-06-11 DIAGNOSIS — F988 Other specified behavioral and emotional disorders with onset usually occurring in childhood and adolescence: Secondary | ICD-10-CM

## 2018-06-11 MED ORDER — METHYLPHENIDATE HCL 10 MG PO TABS: 10.0000 mg | ORAL_TABLET | Freq: Three times a day (TID) | ORAL | 0 refills | Status: DC

## 2018-06-11 NOTE — Telephone Encounter (Signed)
Last ritalin RX: 05/15/18, #90. Last OV: 02/11/18  Next OV: 08/14/18 UDS: 11/13/17, low risk.  CSC: 07/2017 CSR: No discrepancies identified

## 2018-06-12 ENCOUNTER — Other Ambulatory Visit: Payer: Self-pay | Admitting: Family Medicine

## 2018-06-12 DIAGNOSIS — E039 Hypothyroidism, unspecified: Secondary | ICD-10-CM

## 2018-06-12 NOTE — Telephone Encounter (Signed)
Last thyroid panel w/ tsh was done in May and she has an appointment in November.  Are you ok with doing it now or can she wait til November.

## 2018-06-24 ENCOUNTER — Other Ambulatory Visit (INDEPENDENT_AMBULATORY_CARE_PROVIDER_SITE_OTHER): Payer: BLUE CROSS/BLUE SHIELD

## 2018-06-24 DIAGNOSIS — E039 Hypothyroidism, unspecified: Secondary | ICD-10-CM | POA: Diagnosis not present

## 2018-06-25 LAB — THYROID PANEL WITH TSH
Free Thyroxine Index: 3.4 (ref 1.4–3.8)
T3 Uptake: 29 % (ref 22–35)
T4, Total: 11.8 ug/dL (ref 5.1–11.9)
TSH: 0.75 mIU/L

## 2018-07-01 DIAGNOSIS — E669 Obesity, unspecified: Secondary | ICD-10-CM | POA: Diagnosis not present

## 2018-07-01 DIAGNOSIS — K5904 Chronic idiopathic constipation: Secondary | ICD-10-CM | POA: Diagnosis not present

## 2018-07-13 ENCOUNTER — Other Ambulatory Visit: Payer: Self-pay | Admitting: Family Medicine

## 2018-07-13 ENCOUNTER — Encounter: Payer: Self-pay | Admitting: Family Medicine

## 2018-07-13 DIAGNOSIS — F988 Other specified behavioral and emotional disorders with onset usually occurring in childhood and adolescence: Secondary | ICD-10-CM

## 2018-07-14 MED ORDER — METHYLPHENIDATE HCL 10 MG PO TABS: 10.0000 mg | ORAL_TABLET | Freq: Three times a day (TID) | ORAL | 0 refills | Status: DC

## 2018-07-14 NOTE — Telephone Encounter (Signed)
Pt is requesting refill on Ritalin.   Last OV: 02/11/2018 Last Fill: 06/11/2018 #90 and 0RF UDS: 11/13/2017 Low risk

## 2018-07-29 ENCOUNTER — Other Ambulatory Visit: Payer: Self-pay | Admitting: Family Medicine

## 2018-08-07 ENCOUNTER — Other Ambulatory Visit: Payer: Self-pay | Admitting: Family Medicine

## 2018-08-14 ENCOUNTER — Ambulatory Visit: Payer: BLUE CROSS/BLUE SHIELD | Admitting: Family Medicine

## 2018-08-14 ENCOUNTER — Encounter: Payer: Self-pay | Admitting: Family Medicine

## 2018-08-14 VITALS — BP 127/66 | HR 70 | Temp 98.3°F | Resp 16 | Ht 66.0 in | Wt 191.8 lb

## 2018-08-14 DIAGNOSIS — Z79899 Other long term (current) drug therapy: Secondary | ICD-10-CM | POA: Diagnosis not present

## 2018-08-14 DIAGNOSIS — Z23 Encounter for immunization: Secondary | ICD-10-CM

## 2018-08-14 DIAGNOSIS — F988 Other specified behavioral and emotional disorders with onset usually occurring in childhood and adolescence: Secondary | ICD-10-CM | POA: Diagnosis not present

## 2018-08-14 MED ORDER — METHYLPHENIDATE HCL 10 MG PO TABS: 10.0000 mg | ORAL_TABLET | Freq: Three times a day (TID) | ORAL | 0 refills | Status: DC

## 2018-08-14 MED ORDER — METHYLPHENIDATE HCL 10 MG PO TABS
10.0000 mg | ORAL_TABLET | Freq: Three times a day (TID) | ORAL | 0 refills | Status: DC
Start: 2018-08-14 — End: 2018-11-13

## 2018-08-14 NOTE — Assessment & Plan Note (Addendum)
Stable  con't ritalin at same dose  rto 6 months

## 2018-08-14 NOTE — Patient Instructions (Signed)

## 2018-08-14 NOTE — Progress Notes (Signed)
Patient ID: Shelby Warren, female    DOB: 05-May-1966  Age: 52 y.o. MRN: [id number]    Subjective:  Subjective  HPI Shelby Warren presents for f/u ADD.  She has been doing fine with it.  She is not sleeping well but she has been stressed due to recent abnormal mammogram.  She would also like a flu shot.    Review of Systems  Constitutional: Negative for appetite change, diaphoresis, fatigue and unexpected weight change.  Eyes: Negative for pain, redness and visual disturbance.  Respiratory: Negative for cough, chest tightness, shortness of breath and wheezing.   Cardiovascular: Negative for chest pain, palpitations and leg swelling.  Endocrine: Negative for cold intolerance, heat intolerance, polydipsia, polyphagia and polyuria.  Genitourinary: Negative for difficulty urinating, dysuria and frequency.  Neurological: Negative for dizziness, light-headedness, numbness and headaches.    History Past Medical History:  Diagnosis Date  . ADD (attention deficit disorder)   . Allergic rhinitis   . Asthma   . B12 deficiency   . Hypothyroidism     She has a past surgical history that includes Anal fissure repair; spincture muscle; and lasic eye surgery.   Her family history includes Breast cancer in her paternal grandmother; Diabetes in her maternal grandfather; Thyroid disease in her paternal grandmother.She reports that she has never smoked. She has never used smokeless tobacco. She reports that she drinks alcohol. She reports that she does not use drugs.  Current Outpatient Medications on File Prior to Visit  Medication Sig Dispense Refill  . ACZONE 5 % topical gel     . albuterol (VENTOLIN HFA) 108 (90 BASE) MCG/ACT inhaler INHALE 2 PUFFS BY MOUTH EVERY 4 TO 6 HOURS AS NEEDED 18 each 1  . doxepin (SINEQUAN) 10 MG capsule TAKE 1-3 CAPSULES BY MOUTH EVERY NIGHT AT BEDTIME AS NEEDED FOR HIVES 90 capsule 5  . famotidine (PEPCID) 40 MG tablet TAKE 1 TABLET BY MOUTH TWICE DAILY 180 tablet 1    . fexofenadine (ALLEGRA) 180 MG tablet Take 180 mg by mouth daily.    . minocycline (MINOCIN,DYNACIN) 50 MG capsule TK ONE C PO BID  2  . montelukast (SINGULAIR) 10 MG tablet TAKE 1 TABLET BY MOUTH AT BEDTIME 90 tablet 0  . SYNTHROID 175 MCG tablet TAKE 1 TABLET(175 MCG) BY MOUTH DAILY BEFORE BREAKFAST 90 tablet 1   No current facility-administered medications on file prior to visit.      Objective:  Objective  Physical Exam  Constitutional: She is oriented to person, place, and time. She appears well-developed and well-nourished.  HENT:  Head: Normocephalic and atraumatic.  Eyes: Conjunctivae and EOM are normal.  Neck: Normal range of motion. Neck supple. No JVD present. Carotid bruit is not present. No thyromegaly present.  Cardiovascular: Normal rate, regular rhythm and normal heart sounds.  No murmur heard. Pulmonary/Chest: Effort normal and breath sounds normal. No respiratory distress. She has no wheezes. She has no rales. She exhibits no tenderness.  Musculoskeletal: She exhibits no edema.  Neurological: She is alert and oriented to person, place, and time.  Psychiatric: She has a normal mood and affect.  Nursing note and vitals reviewed.  BP 127/66 (BP Location: Right Arm, Cuff Size: Normal)   Pulse 70   Temp 98.3 F (36.8 C) (Oral)   Resp 16   Ht 5\' 6"  (1.676 m)   Wt 191 lb 12.8 oz (87 kg)   LMP 07/31/2018   SpO2 100%   BMI 30.96 kg/m  Wt Readings  from Last 3 Encounters:  08/14/18 191 lb 12.8 oz (87 kg)  02/11/18 191 lb 12.8 oz (87 kg)  08/14/17 190 lb (86.2 kg)     Lab Results  Component Value Date   WBC 6.0 04/13/2015   HGB 13.4 04/13/2015   HCT 39.0 04/13/2015   PLT 269.0 04/13/2015   GLUCOSE 87 04/13/2015   CHOL 200 07/04/2012   TRIG 77.0 07/04/2012   HDL 61.30 07/04/2012   LDLCALC 123 (H) 07/04/2012   ALT 15 07/04/2012   AST 17 07/04/2012   NA 139 04/13/2015   K 4.5 04/13/2015   CL 104 04/13/2015   CREATININE 0.99 04/13/2015   BUN 16  04/13/2015   CO2 27 04/13/2015   TSH 0.75 06/24/2018    Mm Clip Placement Right  Result Date: 03/26/2018 CLINICAL DATA:  Status post stereotactic guided core needle biopsy of an area of questionable, subtle architectural distortion in the upper-outer quadrant of the right breast. EXAM: DIAGNOSTIC RIGHT MAMMOGRAM POST STEREOTACTIC BIOPSY COMPARISON:  Previous exam(s). FINDINGS: Mammographic images were obtained following 3D stereotactic guided biopsy of the recently suspected questionable, subtle architectural distortion in the upper-outer quadrant of the right breast. These demonstrate a coil shaped biopsy marker clip at the location of mammographic concern. There are post biopsy changes at the location of the recently suspected questionable, subtle architectural distortion in the upper-outer quadrant of the breast. IMPRESSION: Appropriate clip deployment following right breast 3D stereotactic guided core needle biopsy. Final Assessment: Post Procedure Mammograms for Marker Placement Electronically Signed   By: Claudie Revering M.D.   On: 03/26/2018 10:19   Mm Rt Breast Bx W Loc Dev 1st Lesion Image Bx Spec Stereo Guide  Addendum Date: 03/27/2018   ADDENDUM REPORT: 03/27/2018 11:15 ADDENDUM: Pathology revealed FIBROCYSTIC CHANGE AND CALCIFICATIONS of RIGHT breast, upper outer quadrant. This was found to be concordant by Dr. Claudie Revering. Pathology results were discussed with the patient by telephone. The patient reported doing well after the biopsy with tenderness at the site. Post biopsy instructions and care were reviewed and questions were answered. The patient was encouraged to call [facility] for any additional concerns. The patient was instructed to return for annual screening mammography which she will have performed at [facility], [place], [place]. Pathology results reported by Roselind Messier, RN on 03/27/2018. Electronically Signed   By: Claudie Revering M.D.   On:  03/27/2018 11:15   Result Date: 03/27/2018 CLINICAL DATA:  Subtle area of distortion in the upper-outer quadrant of the central right breast at recent mammography with no ultrasound correlate. EXAM: LEFT BREAST STEREOTACTIC CORE NEEDLE BIOPSY COMPARISON:  Previous exams. FINDINGS: The patient and I discussed the procedure of stereotactic-guided biopsy including benefits and alternatives. We discussed the high likelihood of a successful procedure. We discussed the risks of the procedure including infection, bleeding, tissue injury, clip migration, and inadequate sampling. Informed written consent was given. The usual time out protocol was performed immediately prior to the procedure. Using sterile technique and 1% Lidocaine as local anesthetic, under stereotactic guidance, a 9 gauge vacuum assisted device was used to perform core needle biopsy of the recently demonstrated area of subtle distortion in the upper-outer quadrant of the right breast using a cephalad approach. Lesion quadrant: Upper outer quadrant At the conclusion of the procedure, a coil shaped tissue marker clip was deployed into the biopsy cavity. Follow-up 2-view mammogram was performed and dictated separately. IMPRESSION: Stereotactic-guided biopsy of the recently demonstrated subtle area of  architectural distortion in the upper-outer quadrant of the central right breast. No apparent complications. Electronically Signed: By: Claudie Revering M.D. On: 03/26/2018 09:59     Assessment & Plan:  Plan  I am having Georgina Peer B. Sevcik start on methylphenidate and methylphenidate. I am also having her maintain her ACZONE, albuterol, fexofenadine, doxepin, minocycline, SYNTHROID, montelukast, famotidine, and methylphenidate.  Meds ordered this encounter  Medications  . methylphenidate (RITALIN) 10 MG tablet    Sig: Take 1 tablet (10 mg total) by mouth 3 (three) times daily with meals.    Dispense:  90 tablet    Refill:  0    Do not fill until Jan 2020    . methylphenidate (RITALIN) 10 MG tablet    Sig: Take 1 tablet (10 mg total) by mouth 3 (three) times daily with meals.    Dispense:  90 tablet    Refill:  0    Do not fill until Dec 2019  . methylphenidate (RITALIN) 10 MG tablet    Sig: Take 1 tablet (10 mg total) by mouth 3 (three) times daily with meals.    Dispense:  90 tablet    Refill:  0    Problem List Items Addressed This Visit      Unprioritized   Attention deficit disorder    Stable  con't ritalin at same dose  rto 6 months      Relevant Medications   methylphenidate (RITALIN) 10 MG tablet   methylphenidate (RITALIN) 10 MG tablet   methylphenidate (RITALIN) 10 MG tablet    Other Visit Diagnoses    Attention deficit disorder (ADD) in adult    -  Primary   Relevant Orders   Pain Mgmt, Profile 8 w/Conf, U   Need for influenza vaccination       Influenza vaccine administered       Relevant Orders   Flu Vaccine QUAD 6+ mos PF IM (Fluarix Quad PF) (Completed)   High risk medication use       Relevant Orders   Pain Mgmt, Profile 8 w/Conf, U      Follow-up: Return in about 6 months (around 02/12/2019), or if symptoms worsen or fail to improve, for ADD, annual exam, fasting.  Ann Held, DO

## 2018-08-19 LAB — PAIN MGMT, PROFILE 8 W/CONF, U
6 ACETYLMORPHINE: NEGATIVE ng/mL (ref <10)
AMPHETAMINES: NEGATIVE ng/mL (ref <500)
Alcohol Metabolites: POSITIVE ng/mL — AB (ref <500)
BENZODIAZEPINES: NEGATIVE ng/mL (ref <100)
Buprenorphine, Urine: NEGATIVE ng/mL (ref <5)
COCAINE METABOLITE: NEGATIVE ng/mL (ref <150)
CREATININE: 25.6 mg/dL
Ethyl Glucuronide (ETG): 782 ng/mL — ABNORMAL HIGH (ref <500)
Ethyl Sulfate (ETS): 107 ng/mL — ABNORMAL HIGH (ref <100)
MDMA: NEGATIVE ng/mL (ref <500)
Marijuana Metabolite: NEGATIVE ng/mL (ref <20)
OPIATES: NEGATIVE ng/mL (ref <100)
Oxidant: NEGATIVE ug/mL (ref <200)
Oxycodone: NEGATIVE ng/mL (ref <100)
PH: 5.84 (ref 4.5–9.0)

## 2018-09-10 ENCOUNTER — Encounter: Payer: Self-pay | Admitting: Family Medicine

## 2018-09-10 ENCOUNTER — Other Ambulatory Visit: Payer: Self-pay | Admitting: Family Medicine

## 2018-09-10 DIAGNOSIS — F988 Other specified behavioral and emotional disorders with onset usually occurring in childhood and adolescence: Secondary | ICD-10-CM

## 2018-10-07 ENCOUNTER — Other Ambulatory Visit: Payer: Self-pay | Admitting: Family Medicine

## 2018-10-07 ENCOUNTER — Encounter: Payer: Self-pay | Admitting: Family Medicine

## 2018-10-07 DIAGNOSIS — E039 Hypothyroidism, unspecified: Secondary | ICD-10-CM

## 2018-10-07 NOTE — Telephone Encounter (Signed)
Order is in -- she just needs lab visit scheduled

## 2018-10-09 ENCOUNTER — Other Ambulatory Visit (INDEPENDENT_AMBULATORY_CARE_PROVIDER_SITE_OTHER): Payer: BLUE CROSS/BLUE SHIELD

## 2018-10-09 DIAGNOSIS — E039 Hypothyroidism, unspecified: Secondary | ICD-10-CM | POA: Diagnosis not present

## 2018-10-10 LAB — THYROID PANEL WITH TSH
FREE THYROXINE INDEX: 3.4 (ref 1.4–3.8)
T3 UPTAKE: 29 % (ref 22–35)
T4 TOTAL: 11.8 ug/dL (ref 5.1–11.9)
TSH: 1.11 m[IU]/L

## 2018-10-16 ENCOUNTER — Encounter: Payer: Self-pay | Admitting: *Deleted

## 2018-10-30 ENCOUNTER — Other Ambulatory Visit: Payer: Self-pay | Admitting: Family Medicine

## 2018-11-11 ENCOUNTER — Encounter: Payer: Self-pay | Admitting: Family Medicine

## 2018-11-12 ENCOUNTER — Encounter: Payer: Self-pay | Admitting: Family Medicine

## 2018-11-13 ENCOUNTER — Telehealth: Payer: Self-pay

## 2018-11-13 ENCOUNTER — Other Ambulatory Visit: Payer: Self-pay | Admitting: Family Medicine

## 2018-11-13 DIAGNOSIS — F988 Other specified behavioral and emotional disorders with onset usually occurring in childhood and adolescence: Secondary | ICD-10-CM

## 2018-11-13 MED ORDER — METHYLPHENIDATE HCL 10 MG PO TABS: 10.0000 mg | ORAL_TABLET | Freq: Three times a day (TID) | ORAL | 0 refills | Status: DC

## 2018-11-13 NOTE — Telephone Encounter (Signed)
Last methylphenidate RX: 08/15/19, X 3 RX (Nov thru Jan) Last OV: 08/14/18 Next OV: due 02/12/19 but not yet scheduled  UDS: 08/14/18, CSC: 08/14/18 CSR: No discrepancies identified

## 2018-11-13 NOTE — Telephone Encounter (Signed)
Please see 11/11/18 pt message.

## 2018-11-13 NOTE — Telephone Encounter (Signed)
Copied from [place] [id number]. Topic: General - Inquiry >> Nov 13, 2018  8:53 AM Conception Chancy, NT wrote: Reason for CRM: patient is calling in regards to getting methylphenidate (RITALIN) 10 MG tablet refilled. She has been sending [employer] and states she has not been acknowledged. Please contact this patient.

## 2018-11-13 NOTE — Telephone Encounter (Signed)
I sent it in 

## 2018-12-11 ENCOUNTER — Other Ambulatory Visit: Payer: Self-pay | Admitting: Internal Medicine

## 2018-12-31 ENCOUNTER — Other Ambulatory Visit: Payer: Self-pay | Admitting: Family Medicine

## 2019-02-02 ENCOUNTER — Other Ambulatory Visit: Payer: Self-pay | Admitting: Family Medicine

## 2019-02-02 DIAGNOSIS — F988 Other specified behavioral and emotional disorders with onset usually occurring in childhood and adolescence: Secondary | ICD-10-CM

## 2019-02-02 MED ORDER — METHYLPHENIDATE HCL 10 MG PO TABS: 10.0000 mg | ORAL_TABLET | Freq: Three times a day (TID) | ORAL | 0 refills | Status: DC

## 2019-02-02 NOTE — Telephone Encounter (Signed)
Requesting: Ritalin Contract: 08/14/2018 UDS: 08/14/2018, low risk Last OV: 08/14/2018 Next OV: N/A Last Refill: 11/13/2018, #90--0 RF Database:   Please advise

## 2019-02-03 ENCOUNTER — Other Ambulatory Visit: Payer: Self-pay | Admitting: Family Medicine

## 2019-02-09 ENCOUNTER — Encounter: Payer: Self-pay | Admitting: Family Medicine

## 2019-02-09 ENCOUNTER — Other Ambulatory Visit: Payer: Self-pay | Admitting: Family Medicine

## 2019-02-09 DIAGNOSIS — E039 Hypothyroidism, unspecified: Secondary | ICD-10-CM

## 2019-02-09 NOTE — Telephone Encounter (Signed)
Checking hormones won't be too helpful if she still has her period I did put the thyroid panal order in

## 2019-02-13 ENCOUNTER — Other Ambulatory Visit (INDEPENDENT_AMBULATORY_CARE_PROVIDER_SITE_OTHER): Payer: BLUE CROSS/BLUE SHIELD

## 2019-02-13 ENCOUNTER — Other Ambulatory Visit: Payer: Self-pay

## 2019-02-13 DIAGNOSIS — E039 Hypothyroidism, unspecified: Secondary | ICD-10-CM | POA: Diagnosis not present

## 2019-02-14 LAB — THYROID PANEL WITH TSH
Free Thyroxine Index: 2.9 (ref 1.4–3.8)
T3 Uptake: 25 % (ref 22–35)
T4, Total: 11.7 ug/dL (ref 5.1–11.9)
TSH: 2.04 mIU/L

## 2019-03-10 ENCOUNTER — Other Ambulatory Visit: Payer: Self-pay | Admitting: Family Medicine

## 2019-03-10 DIAGNOSIS — F988 Other specified behavioral and emotional disorders with onset usually occurring in childhood and adolescence: Secondary | ICD-10-CM

## 2019-03-10 MED ORDER — METHYLPHENIDATE HCL 10 MG PO TABS: 10.0000 mg | ORAL_TABLET | Freq: Three times a day (TID) | ORAL | 0 refills | Status: DC

## 2019-03-10 NOTE — Telephone Encounter (Signed)
Requesting: Ritalin Contract: 08/14/2018 UDS:08/14/2018, low risk Last OV: 08/14/2018 Next OV: N/A Last Refill: 02/02/2019, #90-0 RF Database:   Please advise

## 2019-04-13 ENCOUNTER — Encounter: Payer: Self-pay | Admitting: Family Medicine

## 2019-04-13 ENCOUNTER — Other Ambulatory Visit: Payer: Self-pay

## 2019-04-13 ENCOUNTER — Telehealth: Payer: Self-pay | Admitting: Family Medicine

## 2019-04-13 ENCOUNTER — Other Ambulatory Visit: Payer: Self-pay | Admitting: Family Medicine

## 2019-04-13 DIAGNOSIS — F988 Other specified behavioral and emotional disorders with onset usually occurring in childhood and adolescence: Secondary | ICD-10-CM

## 2019-04-13 MED ORDER — METHYLPHENIDATE HCL 10 MG PO TABS: 10.0000 mg | ORAL_TABLET | Freq: Three times a day (TID) | ORAL | 0 refills | Status: DC

## 2019-04-13 NOTE — Telephone Encounter (Signed)
Requesting: Ritalin Contract: 08/14/2018 UDS:08/14/2018, low risk, 01/2019 Last OV:08/14/2018 Next OV: N/A Last Refill: 03/10/2019, #90--0 RF Database:   Please advise

## 2019-04-13 NOTE — Telephone Encounter (Signed)
Ritalin refill.   Last OV: 08/14/2018 Last Fill: 03/10/2019 #90 and 0RF UDS: 08/14/2018 Low risk

## 2019-04-13 NOTE — Telephone Encounter (Unsigned)
Copied from [place] [id number]. Topic: Quick Communication - Rx Refill/Question >> Apr 13, 2019  5:08 PM Mcneil, Ja-Kwan wrote: Medication: methylphenidate (RITALIN) 10 MG tablet  Has the patient contacted their pharmacy? yes   Preferred Pharmacy (with phone number or street name): [facility], [place] - [address] [place] [address] [phone] (Phone)  [phone] (Fax)  Agent: Please be advised that RX refills may take up to 3 business days. We ask that you follow-up with your pharmacy.

## 2019-04-14 ENCOUNTER — Ambulatory Visit: Payer: BLUE CROSS/BLUE SHIELD | Admitting: Family Medicine

## 2019-04-14 ENCOUNTER — Encounter: Payer: Self-pay | Admitting: Family Medicine

## 2019-04-14 ENCOUNTER — Other Ambulatory Visit: Payer: Self-pay

## 2019-04-14 DIAGNOSIS — F988 Other specified behavioral and emotional disorders with onset usually occurring in childhood and adolescence: Secondary | ICD-10-CM

## 2019-04-14 MED ORDER — METHYLPHENIDATE HCL 10 MG PO TABS: 10.0000 mg | ORAL_TABLET | Freq: Three times a day (TID) | ORAL | 0 refills | Status: DC

## 2019-04-14 NOTE — Assessment & Plan Note (Signed)
Discussed diet and exercise with pt Discussed WW, [employer],  Healthy weight and wellness Pt will think about it and let us know

## 2019-04-14 NOTE — Progress Notes (Signed)
Patient ID: Shelby Warren, female    DOB: 1966/08/19  Age: 53 y.o. MRN: [id number]    Subjective:  Subjective  HPI Shelby Warren presents for f/u add.  Pt with no complaints    She is struggling with weight loss as she is getting older.  She is on a keto type diet now----  It really has not been successful.    Review of Systems  Constitutional: Negative for appetite change, diaphoresis, fatigue and unexpected weight change.  Eyes: Negative for pain, redness and visual disturbance.  Respiratory: Negative for cough, chest tightness, shortness of breath and wheezing.   Cardiovascular: Negative for chest pain, palpitations and leg swelling.  Endocrine: Negative for cold intolerance, heat intolerance, polydipsia, polyphagia and polyuria.  Genitourinary: Negative for difficulty urinating, dysuria and frequency.  Neurological: Negative for dizziness, light-headedness, numbness and headaches.    History Past Medical History:  Diagnosis Date   ADD (attention deficit disorder)    Allergic rhinitis    Asthma    B12 deficiency    Hypothyroidism     She has a past surgical history that includes Anal fissure repair; spincture muscle; and lasic eye surgery.   Her family history includes Breast cancer in her paternal grandmother; Diabetes in her maternal grandfather; Thyroid disease in her paternal grandmother.She reports that she has never smoked. She has never used smokeless tobacco. She reports current alcohol use. She reports that she does not use drugs.  Current Outpatient Medications on File Prior to Visit  Medication Sig Dispense Refill   ACZONE 5 % topical gel      albuterol (VENTOLIN HFA) 108 (90 BASE) MCG/ACT inhaler INHALE 2 PUFFS BY MOUTH EVERY 4 TO 6 HOURS AS NEEDED 18 each 1   doxepin (SINEQUAN) 10 MG capsule TAKE 1-3 CAPSULES BY MOUTH EVERY NIGHT AT BEDTIME AS NEEDED FOR HIVES 90 capsule 5   famotidine (PEPCID) 40 MG tablet TAKE 1 TABLET BY MOUTH TWICE DAILY 180 tablet 1     fexofenadine (ALLEGRA) 180 MG tablet Take 180 mg by mouth daily.     methylphenidate (RITALIN) 10 MG tablet Take 1 tablet (10 mg total) by mouth 3 (three) times daily with meals. 90 tablet 0   minocycline (MINOCIN,DYNACIN) 50 MG capsule TK ONE C PO BID  2   montelukast (SINGULAIR) 10 MG tablet TAKE 1 TABLET BY MOUTH AT BEDTIME 90 tablet 0   SYNTHROID 175 MCG tablet TAKE 1 TABLET(175 MCG) BY MOUTH DAILY BEFORE BREAKFAST 90 tablet 0   No current facility-administered medications on file prior to visit.      Objective:  Objective  Physical Exam Vitals signs and nursing note reviewed.  Constitutional:      Appearance: She is well-developed.  HENT:     Head: Normocephalic and atraumatic.  Eyes:     Conjunctiva/sclera: Conjunctivae normal.  Neck:     Musculoskeletal: Normal range of motion and neck supple.     Thyroid: No thyromegaly.     Vascular: No carotid bruit or JVD.  Cardiovascular:     Rate and Rhythm: Normal rate and regular rhythm.     Heart sounds: Normal heart sounds. No murmur.  Pulmonary:     Effort: Pulmonary effort is normal. No respiratory distress.     Breath sounds: Normal breath sounds. No wheezing or rales.  Chest:     Chest wall: No tenderness.  Neurological:     Mental Status: She is alert and oriented to person, place, and time.  BP 140/82 (BP Location: Right Arm, Patient Position: Sitting, Cuff Size: Normal)    Pulse 79    Temp 98.2 F (36.8 C) (Oral)    Resp 18    Ht 5\' 6"  (1.676 m)    Wt 199 lb 12.8 oz (90.6 kg)    SpO2 100%    BMI 32.25 kg/m  Wt Readings from Last 3 Encounters:  04/14/19 199 lb 12.8 oz (90.6 kg)  08/14/18 191 lb 12.8 oz (87 kg)  02/11/18 191 lb 12.8 oz (87 kg)     Lab Results  Component Value Date   WBC 6.0 04/13/2015   HGB 13.4 04/13/2015   HCT 39.0 04/13/2015   PLT 269.0 04/13/2015   GLUCOSE 87 04/13/2015   CHOL 200 07/04/2012   TRIG 77.0 07/04/2012   HDL 61.30 07/04/2012   LDLCALC 123 (H) 07/04/2012   ALT 15  07/04/2012   AST 17 07/04/2012   NA 139 04/13/2015   K 4.5 04/13/2015   CL 104 04/13/2015   CREATININE 0.99 04/13/2015   BUN 16 04/13/2015   CO2 27 04/13/2015   TSH 2.04 02/13/2019    Mm Clip Placement Right  Result Date: 03/26/2018 CLINICAL DATA:  Status post stereotactic guided core needle biopsy of an area of questionable, subtle architectural distortion in the upper-outer quadrant of the right breast. EXAM: DIAGNOSTIC RIGHT MAMMOGRAM POST STEREOTACTIC BIOPSY COMPARISON:  Previous exam(s). FINDINGS: Mammographic images were obtained following 3D stereotactic guided biopsy of the recently suspected questionable, subtle architectural distortion in the upper-outer quadrant of the right breast. These demonstrate a coil shaped biopsy marker clip at the location of mammographic concern. There are post biopsy changes at the location of the recently suspected questionable, subtle architectural distortion in the upper-outer quadrant of the breast. IMPRESSION: Appropriate clip deployment following right breast 3D stereotactic guided core needle biopsy. Final Assessment: Post Procedure Mammograms for Marker Placement Electronically Signed   By: Claudie Revering M.D.   On: 03/26/2018 10:19   Mm Rt Breast Bx W Loc Dev 1st Lesion Image Bx Spec Stereo Guide  Addendum Date: 03/27/2018   ADDENDUM REPORT: 03/27/2018 11:15 ADDENDUM: Pathology revealed FIBROCYSTIC CHANGE AND CALCIFICATIONS of RIGHT breast, upper outer quadrant. This was found to be concordant by Dr. Claudie Revering. Pathology results were discussed with the patient by telephone. The patient reported doing well after the biopsy with tenderness at the site. Post biopsy instructions and care were reviewed and questions were answered. The patient was encouraged to call [facility] for any additional concerns. The patient was instructed to return for annual screening mammography which she will have performed at [facility],  [place], [place]. Pathology results reported by Roselind Messier, RN on 03/27/2018. Electronically Signed   By: Claudie Revering M.D.   On: 03/27/2018 11:15   Result Date: 03/27/2018 CLINICAL DATA:  Subtle area of distortion in the upper-outer quadrant of the central right breast at recent mammography with no ultrasound correlate. EXAM: LEFT BREAST STEREOTACTIC CORE NEEDLE BIOPSY COMPARISON:  Previous exams. FINDINGS: The patient and I discussed the procedure of stereotactic-guided biopsy including benefits and alternatives. We discussed the high likelihood of a successful procedure. We discussed the risks of the procedure including infection, bleeding, tissue injury, clip migration, and inadequate sampling. Informed written consent was given. The usual time out protocol was performed immediately prior to the procedure. Using sterile technique and 1% Lidocaine as local anesthetic, under stereotactic guidance, a 9 gauge vacuum assisted device was used to  perform core needle biopsy of the recently demonstrated area of subtle distortion in the upper-outer quadrant of the right breast using a cephalad approach. Lesion quadrant: Upper outer quadrant At the conclusion of the procedure, a coil shaped tissue marker clip was deployed into the biopsy cavity. Follow-up 2-view mammogram was performed and dictated separately. IMPRESSION: Stereotactic-guided biopsy of the recently demonstrated subtle area of architectural distortion in the upper-outer quadrant of the central right breast. No apparent complications. Electronically Signed: By: Claudie Revering M.D. On: 03/26/2018 09:59     Assessment & Plan:  Plan  I am having Shelby Warren start on methylphenidate and methylphenidate. I am also having her maintain her Aczone, albuterol, fexofenadine, minocycline, famotidine, doxepin, Synthroid, montelukast, and methylphenidate.  Meds ordered this encounter  Medications   methylphenidate (RITALIN) 10 MG tablet    Sig: Take 1 tablet  (10 mg total) by mouth 3 (three) times daily with meals.    Dispense:  90 tablet    Refill:  0    Do not fill until Sept 2020   methylphenidate (RITALIN) 10 MG tablet    Sig: Take 1 tablet (10 mg total) by mouth 3 (three) times daily with meals.    Dispense:  90 tablet    Refill:  0    Do not fill until Aug 2020    Problem List Items Addressed This Visit      Unprioritized   Attention deficit disorder   Relevant Medications   methylphenidate (RITALIN) 10 MG tablet   methylphenidate (RITALIN) 10 MG tablet   Morbid obesity (HCC)    Discussed diet and exercise with pt Discussed [employer], [employer],  Healthy weight and wellness Pt will think about it and let us know       Relevant Medications   methylphenidate (RITALIN) 10 MG tablet   methylphenidate (RITALIN) 10 MG tablet    stable,  Refill meds   Follow-up: Return in about 6 months (around 10/15/2019), or if symptoms worsen or fail to improve, for annual exam, fasting.  Ann Held, DO

## 2019-04-28 ENCOUNTER — Other Ambulatory Visit: Payer: Self-pay | Admitting: *Deleted

## 2019-04-28 MED ORDER — SYNTHROID 175 MCG PO TABS: 175.0000 ug | ORAL_TABLET | Freq: Every day | ORAL | 1 refills | Status: DC

## 2019-05-05 ENCOUNTER — Other Ambulatory Visit: Payer: Self-pay | Admitting: *Deleted

## 2019-05-05 MED ORDER — MONTELUKAST SODIUM 10 MG PO TABS: 10.0000 mg | ORAL_TABLET | Freq: Every day | ORAL | 1 refills | Status: DC

## 2019-05-27 DIAGNOSIS — Z1231 Encounter for screening mammogram for malignant neoplasm of breast: Secondary | ICD-10-CM | POA: Diagnosis not present

## 2019-05-27 DIAGNOSIS — Z13820 Encounter for screening for osteoporosis: Secondary | ICD-10-CM | POA: Diagnosis not present

## 2019-06-18 DIAGNOSIS — Z01419 Encounter for gynecological examination (general) (routine) without abnormal findings: Secondary | ICD-10-CM | POA: Diagnosis not present

## 2019-06-18 DIAGNOSIS — Z6832 Body mass index (BMI) 32.0-32.9, adult: Secondary | ICD-10-CM | POA: Diagnosis not present

## 2019-06-18 DIAGNOSIS — R102 Pelvic and perineal pain: Secondary | ICD-10-CM | POA: Diagnosis not present

## 2019-06-22 DIAGNOSIS — R102 Pelvic and perineal pain: Secondary | ICD-10-CM | POA: Diagnosis not present

## 2019-07-08 ENCOUNTER — Encounter: Payer: Self-pay | Admitting: Family Medicine

## 2019-07-08 ENCOUNTER — Telehealth: Payer: Self-pay | Admitting: Family Medicine

## 2019-07-08 DIAGNOSIS — F988 Other specified behavioral and emotional disorders with onset usually occurring in childhood and adolescence: Secondary | ICD-10-CM

## 2019-07-08 NOTE — Telephone Encounter (Signed)
Last written: 04/14/19 Last ov: 04/14/19 Next ov: nothing scheduled Contract: due UDS: due

## 2019-07-09 ENCOUNTER — Encounter: Payer: Self-pay | Admitting: Family Medicine

## 2019-07-09 ENCOUNTER — Other Ambulatory Visit: Payer: Self-pay | Admitting: Family Medicine

## 2019-07-09 DIAGNOSIS — F988 Other specified behavioral and emotional disorders with onset usually occurring in childhood and adolescence: Secondary | ICD-10-CM

## 2019-07-09 MED ORDER — METHYLPHENIDATE HCL 10 MG PO TABS: 10.0000 mg | ORAL_TABLET | Freq: Three times a day (TID) | ORAL | 0 refills | Status: DC

## 2019-07-09 NOTE — Telephone Encounter (Signed)
Requesting: Ritalin Contract: 08/14/2018 UDS: 08/14/2018 Last OV: 04/14/2019 Next OV: N/A Last Refill: 04/14/2019, #90--0 RF Database:   Please advise

## 2019-07-09 NOTE — Telephone Encounter (Signed)
Pt called back in to follow up on request. Pt says that she runs out of her medication on Sunday, she requested them sooner so that she doesn't run out.   Please assist pt further.

## 2019-07-10 NOTE — Telephone Encounter (Signed)
Patient notified and she stated that she did get confirmation from [facility]  That medication is ready.

## 2019-07-18 ENCOUNTER — Other Ambulatory Visit: Payer: Self-pay | Admitting: Internal Medicine

## 2019-10-06 ENCOUNTER — Other Ambulatory Visit: Payer: Self-pay | Admitting: Family Medicine

## 2019-10-06 ENCOUNTER — Encounter: Payer: Self-pay | Admitting: Family Medicine

## 2019-10-06 DIAGNOSIS — F988 Other specified behavioral and emotional disorders with onset usually occurring in childhood and adolescence: Secondary | ICD-10-CM

## 2019-10-06 MED ORDER — METHYLPHENIDATE HCL 10 MG PO TABS: 10.0000 mg | ORAL_TABLET | Freq: Three times a day (TID) | ORAL | 0 refills | Status: DC

## 2019-10-06 NOTE — Telephone Encounter (Signed)
Requesting: Ritalin Contract: 08/14/2018 UDS: 08/14/2018 Last OV: 04/14/2019 Next OV: N/A Last Refill: 07/09/2019, #90--0 RF Database:   Please advise

## 2019-10-06 NOTE — Telephone Encounter (Signed)
I will refill 1 month but she is due for ov ,  contract and uds

## 2019-10-30 ENCOUNTER — Other Ambulatory Visit: Payer: Self-pay

## 2019-10-30 MED ORDER — SYNTHROID 175 MCG PO TABS: 175.0000 ug | ORAL_TABLET | Freq: Every day | ORAL | 1 refills | Status: DC

## 2019-11-03 ENCOUNTER — Encounter: Payer: Self-pay | Admitting: Family Medicine

## 2019-11-03 ENCOUNTER — Other Ambulatory Visit: Payer: Self-pay | Admitting: Family Medicine

## 2019-11-03 DIAGNOSIS — E039 Hypothyroidism, unspecified: Secondary | ICD-10-CM

## 2019-11-03 NOTE — Telephone Encounter (Signed)
Order is in.

## 2019-11-06 ENCOUNTER — Other Ambulatory Visit: Payer: Self-pay

## 2019-11-06 ENCOUNTER — Other Ambulatory Visit (INDEPENDENT_AMBULATORY_CARE_PROVIDER_SITE_OTHER): Payer: BLUE CROSS/BLUE SHIELD

## 2019-11-06 DIAGNOSIS — E039 Hypothyroidism, unspecified: Secondary | ICD-10-CM

## 2019-11-06 DIAGNOSIS — R635 Abnormal weight gain: Secondary | ICD-10-CM | POA: Diagnosis not present

## 2019-11-07 LAB — THYROID PANEL WITH TSH
Free Thyroxine Index: 4.1 — ABNORMAL HIGH (ref 1.4–3.8)
T3 Uptake: 29 % (ref 22–35)
T4, Total: 14.3 ug/dL — ABNORMAL HIGH (ref 5.1–11.9)
TSH: 0.34 mIU/L — ABNORMAL LOW

## 2019-11-09 ENCOUNTER — Encounter: Payer: Self-pay | Admitting: Family Medicine

## 2019-11-09 ENCOUNTER — Other Ambulatory Visit: Payer: Self-pay | Admitting: Family Medicine

## 2019-11-09 DIAGNOSIS — E039 Hypothyroidism, unspecified: Secondary | ICD-10-CM

## 2019-11-09 DIAGNOSIS — F988 Other specified behavioral and emotional disorders with onset usually occurring in childhood and adolescence: Secondary | ICD-10-CM

## 2019-11-09 MED ORDER — METHYLPHENIDATE HCL 10 MG PO TABS: 10.0000 mg | ORAL_TABLET | Freq: Three times a day (TID) | ORAL | 0 refills | Status: DC

## 2019-11-09 NOTE — Telephone Encounter (Signed)
Requesting: Ritalin Contract: 08/14/2018 UDS: 08/14/2018, low risk Last OV: 04/14/2019 Next OV: N/A Last Refill: 10/06/2019, #90--0 RF Database:   Please advise

## 2019-11-10 ENCOUNTER — Encounter: Payer: Self-pay | Admitting: Family Medicine

## 2019-11-10 MED ORDER — SYNTHROID 150 MCG PO TABS: 150.0000 ug | ORAL_TABLET | Freq: Every day | ORAL | 2 refills | Status: DC

## 2019-11-11 ENCOUNTER — Other Ambulatory Visit: Payer: Self-pay

## 2019-12-07 ENCOUNTER — Other Ambulatory Visit: Payer: Self-pay

## 2019-12-08 ENCOUNTER — Encounter: Payer: Self-pay | Admitting: Family Medicine

## 2019-12-08 ENCOUNTER — Other Ambulatory Visit: Payer: Self-pay

## 2019-12-08 ENCOUNTER — Ambulatory Visit: Payer: BLUE CROSS/BLUE SHIELD | Admitting: Family Medicine

## 2019-12-08 VITALS — BP 132/80 | HR 68 | Temp 97.7°F | Resp 18 | Ht 66.0 in | Wt 204.0 lb

## 2019-12-08 DIAGNOSIS — T7840XA Allergy, unspecified, initial encounter: Secondary | ICD-10-CM | POA: Diagnosis not present

## 2019-12-08 DIAGNOSIS — K219 Gastro-esophageal reflux disease without esophagitis: Secondary | ICD-10-CM

## 2019-12-08 DIAGNOSIS — F988 Other specified behavioral and emotional disorders with onset usually occurring in childhood and adolescence: Secondary | ICD-10-CM

## 2019-12-08 DIAGNOSIS — L501 Idiopathic urticaria: Secondary | ICD-10-CM

## 2019-12-08 DIAGNOSIS — E038 Other specified hypothyroidism: Secondary | ICD-10-CM

## 2019-12-08 DIAGNOSIS — J3089 Other allergic rhinitis: Secondary | ICD-10-CM

## 2019-12-08 DIAGNOSIS — J302 Other seasonal allergic rhinitis: Secondary | ICD-10-CM

## 2019-12-08 MED ORDER — FAMOTIDINE 20 MG PO TABS: 20.0000 mg | ORAL_TABLET | Freq: Two times a day (BID) | ORAL | 2 refills | Status: DC

## 2019-12-08 MED ORDER — OMEPRAZOLE MAGNESIUM 20 MG PO TBEC: 20.0000 mg | DELAYED_RELEASE_TABLET | Freq: Every day | ORAL | 0 refills | Status: DC

## 2019-12-08 MED ORDER — METHYLPHENIDATE HCL 10 MG PO TABS: 10.0000 mg | ORAL_TABLET | Freq: Three times a day (TID) | ORAL | 0 refills | Status: DC

## 2019-12-08 MED ORDER — DOXEPIN HCL 10 MG PO CAPS: ORAL_CAPSULE | ORAL | 5 refills | Status: DC

## 2019-12-08 NOTE — Progress Notes (Signed)
Patient ID: Shelby Warren, female    DOB: Dec 29, 1965  Age: 54 y.o. MRN: [id number]    Subjective:  Subjective  HPI Shelby Warren presents for f/u add.  Pt with no complaints  She also would like to go back on pepcid instead of daily prilosec.  She also needs a refill on her allergy medication.     Review of Systems  Constitutional: Negative for appetite change, diaphoresis, fatigue and unexpected weight change.  Eyes: Negative for pain, redness and visual disturbance.  Respiratory: Negative for cough, chest tightness, shortness of breath and wheezing.   Cardiovascular: Negative for chest pain, palpitations and leg swelling.  Endocrine: Negative for cold intolerance, heat intolerance, polydipsia, polyphagia and polyuria.  Genitourinary: Negative for difficulty urinating, dysuria and frequency.  Neurological: Negative for dizziness, light-headedness, numbness and headaches.    History Past Medical History:  Diagnosis Date  . ADD (attention deficit disorder)   . Allergic rhinitis   . Asthma   . B12 deficiency   . Hypothyroidism     She has a past surgical history that includes Anal fissure repair; spincture muscle; and lasic eye surgery.   Her family history includes Breast cancer in her paternal grandmother; Diabetes in her maternal grandfather; Thyroid disease in her paternal grandmother.She reports that she has never smoked. She has never used smokeless tobacco. She reports current alcohol use. She reports that she does not use drugs.  Current Outpatient Medications on File Prior to Visit  Medication Sig Dispense Refill  . ACZONE 5 % topical gel     . fexofenadine (ALLEGRA) 180 MG tablet Take 180 mg by mouth daily.    Marland Kitchen SYNTHROID 150 MCG tablet Take 1 tablet (150 mcg total) by mouth daily before breakfast. 30 tablet 2  . minocycline (MINOCIN,DYNACIN) 50 MG capsule TK ONE C PO BID  2   No current facility-administered medications on file prior to visit.     Objective:    Objective  Physical Exam Vitals and nursing note reviewed.  Constitutional:      Appearance: She is well-developed.  HENT:     Head: Normocephalic and atraumatic.  Eyes:     Conjunctiva/sclera: Conjunctivae normal.  Neck:     Thyroid: No thyromegaly.     Vascular: No carotid bruit or JVD.  Cardiovascular:     Rate and Rhythm: Normal rate and regular rhythm.     Heart sounds: Normal heart sounds. No murmur.  Pulmonary:     Effort: Pulmonary effort is normal. No respiratory distress.     Breath sounds: Normal breath sounds. No wheezing or rales.  Chest:     Chest wall: No tenderness.  Musculoskeletal:     Cervical back: Normal range of motion and neck supple.  Neurological:     Mental Status: She is alert and oriented to person, place, and time.  Psychiatric:        Mood and Affect: Mood normal.        Behavior: Behavior normal.        Thought Content: Thought content normal.        Judgment: Judgment normal.    BP 132/80 (BP Location: Right Arm, Patient Position: Sitting, Cuff Size: Normal)   Pulse 68   Temp 97.7 F (36.5 C) (Temporal)   Resp 18   Ht 5\' 6"  (1.676 m)   Wt 204 lb (92.5 kg)   SpO2 98%   BMI 32.93 kg/m  Wt Readings from Last 3 Encounters:  12/08/19 204  lb (92.5 kg)  04/14/19 199 lb 12.8 oz (90.6 kg)  08/14/18 191 lb 12.8 oz (87 kg)     Lab Results  Component Value Date   WBC 6.0 04/13/2015   HGB 13.4 04/13/2015   HCT 39.0 04/13/2015   PLT 269.0 04/13/2015   GLUCOSE 87 04/13/2015   CHOL 200 07/04/2012   TRIG 77.0 07/04/2012   HDL 61.30 07/04/2012   LDLCALC 123 (H) 07/04/2012   ALT 15 07/04/2012   AST 17 07/04/2012   NA 139 04/13/2015   K 4.5 04/13/2015   CL 104 04/13/2015   CREATININE 0.99 04/13/2015   BUN 16 04/13/2015   CO2 27 04/13/2015   TSH 0.34 (L) 11/06/2019    MM CLIP PLACEMENT RIGHT  Result Date: 03/26/2018 CLINICAL DATA:  Status post stereotactic guided core needle biopsy of an area of questionable, subtle architectural  distortion in the upper-outer quadrant of the right breast. EXAM: DIAGNOSTIC RIGHT MAMMOGRAM POST STEREOTACTIC BIOPSY COMPARISON:  Previous exam(s). FINDINGS: Mammographic images were obtained following 3D stereotactic guided biopsy of the recently suspected questionable, subtle architectural distortion in the upper-outer quadrant of the right breast. These demonstrate a coil shaped biopsy marker clip at the location of mammographic concern. There are post biopsy changes at the location of the recently suspected questionable, subtle architectural distortion in the upper-outer quadrant of the breast. IMPRESSION: Appropriate clip deployment following right breast 3D stereotactic guided core needle biopsy. Final Assessment: Post Procedure Mammograms for Marker Placement Electronically Signed   By: Claudie Revering M.D.   On: 03/26/2018 10:19   MM RT BREAST BX W LOC DEV 1ST LESION IMAGE BX SPEC STEREO GUIDE  Addendum Date: 03/27/2018   ADDENDUM REPORT: 03/27/2018 11:15 ADDENDUM: Pathology revealed FIBROCYSTIC CHANGE AND CALCIFICATIONS of RIGHT breast, upper outer quadrant. This was found to be concordant by Dr. Claudie Revering. Pathology results were discussed with the patient by telephone. The patient reported doing well after the biopsy with tenderness at the site. Post biopsy instructions and care were reviewed and questions were answered. The patient was encouraged to call [facility] for any additional concerns. The patient was instructed to return for annual screening mammography which she will have performed at [facility], [place], [place]. Pathology results reported by Roselind Messier, RN on 03/27/2018. Electronically Signed   By: Claudie Revering M.D.   On: 03/27/2018 11:15   Result Date: 03/27/2018 CLINICAL DATA:  Subtle area of distortion in the upper-outer quadrant of the central right breast at recent mammography with no ultrasound correlate. EXAM: LEFT BREAST STEREOTACTIC CORE  NEEDLE BIOPSY COMPARISON:  Previous exams. FINDINGS: The patient and I discussed the procedure of stereotactic-guided biopsy including benefits and alternatives. We discussed the high likelihood of a successful procedure. We discussed the risks of the procedure including infection, bleeding, tissue injury, clip migration, and inadequate sampling. Informed written consent was given. The usual time out protocol was performed immediately prior to the procedure. Using sterile technique and 1% Lidocaine as local anesthetic, under stereotactic guidance, a 9 gauge vacuum assisted device was used to perform core needle biopsy of the recently demonstrated area of subtle distortion in the upper-outer quadrant of the right breast using a cephalad approach. Lesion quadrant: Upper outer quadrant At the conclusion of the procedure, a coil shaped tissue marker clip was deployed into the biopsy cavity. Follow-up 2-view mammogram was performed and dictated separately. IMPRESSION: Stereotactic-guided biopsy of the recently demonstrated subtle area of architectural distortion in the upper-outer quadrant  of the central right breast. No apparent complications. Electronically Signed: By: Claudie Revering M.D. On: 03/26/2018 09:59     Assessment & Plan:  Plan  I have discontinued Georgina Peer B. Rau's albuterol, famotidine, montelukast, and omeprazole. I am also having her start on famotidine. Additionally, I am having her maintain her Aczone, fexofenadine, minocycline, Synthroid, methylphenidate, methylphenidate, methylphenidate, and doxepin.  Meds ordered this encounter  Medications  . methylphenidate (RITALIN) 10 MG tablet    Sig: Take 1 tablet (10 mg total) by mouth 3 (three) times daily with meals.    Dispense:  90 tablet    Refill:  0    Do not fill until May 2021  . methylphenidate (RITALIN) 10 MG tablet    Sig: Take 1 tablet (10 mg total) by mouth 3 (three) times daily with meals. Do not fill until December 07, 2019     Dispense:  90 tablet    Refill:  0  . methylphenidate (RITALIN) 10 MG tablet    Sig: Take 1 tablet (10 mg total) by mouth 3 (three) times daily with meals.    Dispense:  90 tablet    Refill:  0    Do not fill until April 8,2021  . doxepin (SINEQUAN) 10 MG capsule    Sig: TAKE 1-3 CAPSULES BY MOUTH EVERY NIGHT AT BEDTIME AS NEEDED FOR HIVES    Dispense:  90 capsule    Refill:  5  . DISCONTD: omeprazole (PRILOSEC OTC) 20 MG tablet    Sig: Take 1 tablet (20 mg total) by mouth daily.    Dispense:  30 tablet    Refill:  0  . famotidine (PEPCID) 20 MG tablet    Sig: Take 1 tablet (20 mg total) by mouth 2 (two) times daily.    Dispense:  60 tablet    Refill:  2    Problem List Items Addressed This Visit      Unprioritized   Attention deficit disorder    Refill labs Pt stable with meds No complaints       Relevant Medications   methylphenidate (RITALIN) 10 MG tablet   methylphenidate (RITALIN) 10 MG tablet   methylphenidate (RITALIN) 10 MG tablet   GERD   Relevant Medications   famotidine (PEPCID) 20 MG tablet   Hypothyroidism    Recheck labs next month       Idiopathic urticaria    Refill doxepin      Seasonal and perennial allergic rhinitis    Refill doxepin        Other Visit Diagnoses    Allergy, initial encounter    -  Primary   Relevant Medications   doxepin (SINEQUAN) 10 MG capsule      Follow-up: Return in about 6 months (around 06/09/2020), or if symptoms worsen or fail to improve.  Ann Held, DO

## 2019-12-08 NOTE — Assessment & Plan Note (Signed)
Recheck labs next month

## 2019-12-08 NOTE — Assessment & Plan Note (Signed)
Refill doxepin

## 2019-12-08 NOTE — Patient Instructions (Signed)

## 2019-12-08 NOTE — Assessment & Plan Note (Signed)
Refill labs Pt stable with meds No complaints

## 2019-12-23 ENCOUNTER — Other Ambulatory Visit: Payer: Self-pay

## 2019-12-23 DIAGNOSIS — E039 Hypothyroidism, unspecified: Secondary | ICD-10-CM

## 2019-12-28 ENCOUNTER — Other Ambulatory Visit: Payer: BLUE CROSS/BLUE SHIELD

## 2019-12-28 DIAGNOSIS — E039 Hypothyroidism, unspecified: Secondary | ICD-10-CM | POA: Diagnosis not present

## 2019-12-29 LAB — THYROID PANEL WITH TSH
Free Thyroxine Index: 3.7 (ref 1.4–3.8)
T3 Uptake: 30 % (ref 22–35)
T4, Total: 12.2 ug/dL — ABNORMAL HIGH (ref 5.1–11.9)
TSH: 0.91 mIU/L

## 2020-01-10 IMAGING — MG STEREOTACTIC VACUUM ASSIST RIGHT
8 of 12 series · 8 of 24 positions shown · non-contrast
Comparison: Previous exams.

ADDENDUM:
Pathology revealed FIBROCYSTIC CHANGE AND CALCIFICATIONS of RIGHT
breast, upper outer quadrant. This was found to be concordant by Dr.
Manolache Isola.

Pathology results were discussed with the patient by telephone. The
patient reported doing well after the biopsy with tenderness at the
site. Post biopsy instructions and care were reviewed and questions
were answered. The patient was encouraged to call The [REDACTED]
The patient was instructed to return for annual screening
[HOSPITAL][HOSPITAL].
Pathology results reported by Marito Raber, RN on 03/27/2018.
CLINICAL DATA: Subtle area of distortion in the upper-outer
quadrant of the central right breast at recent mammography with no
ultrasound correlate.
EXAM:
LEFT BREAST STEREOTACTIC CORE NEEDLE BIOPSY

[R (1 of 8)]
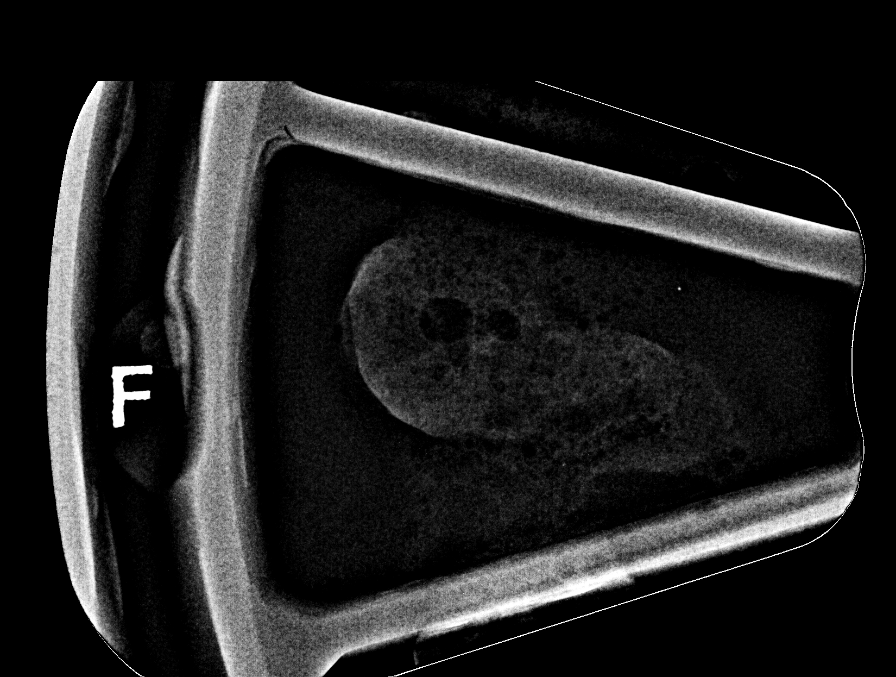

[R (2 of 8)]
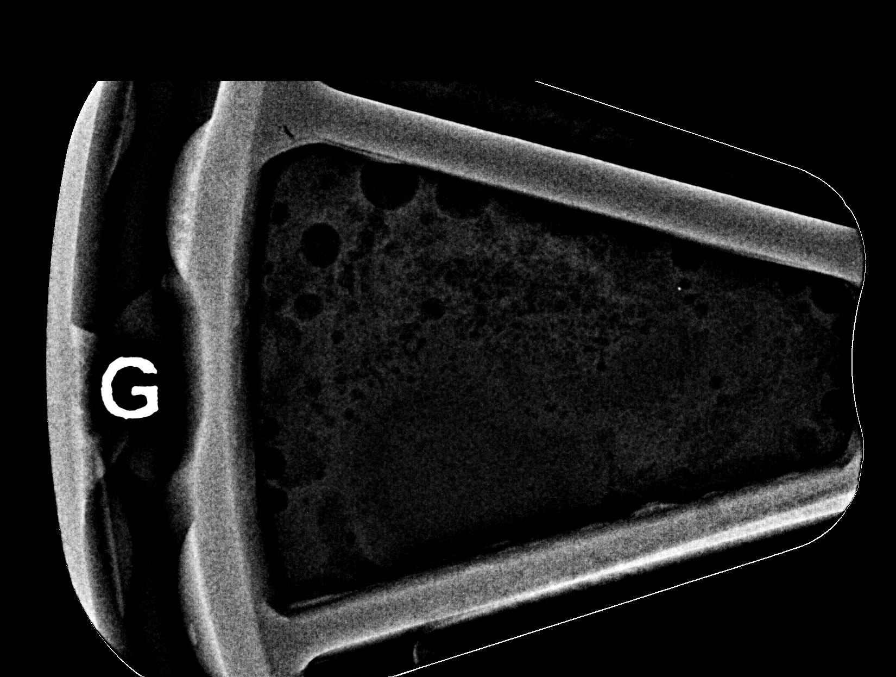

[R (3 of 8)]
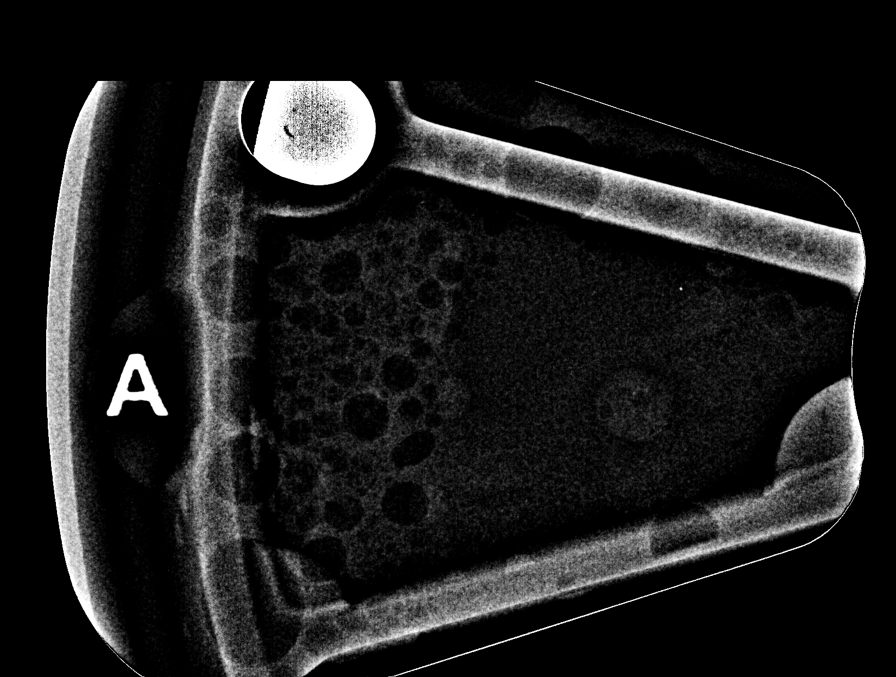

[R (4 of 8)]
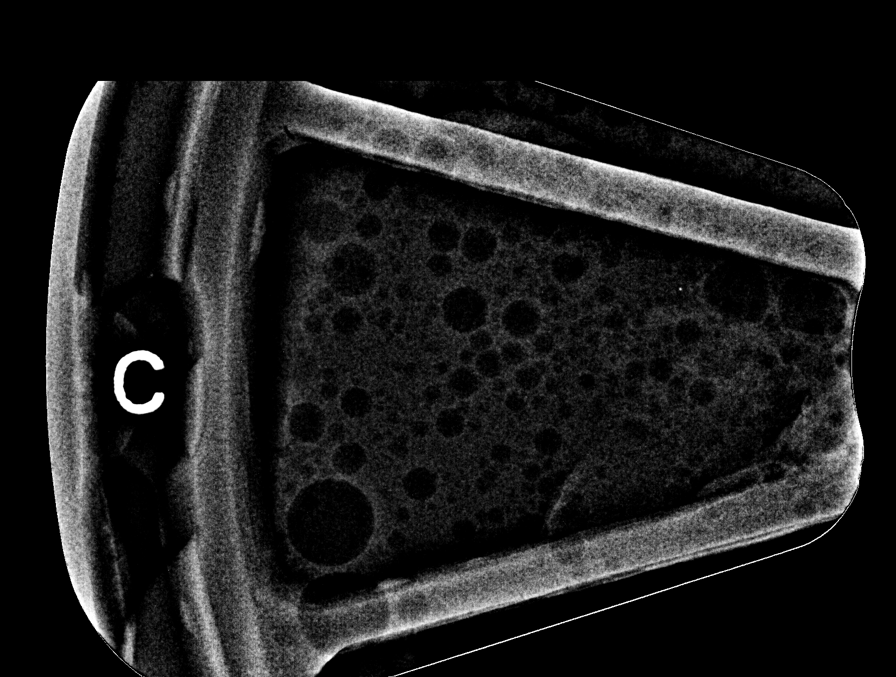

[R (5 of 8)]
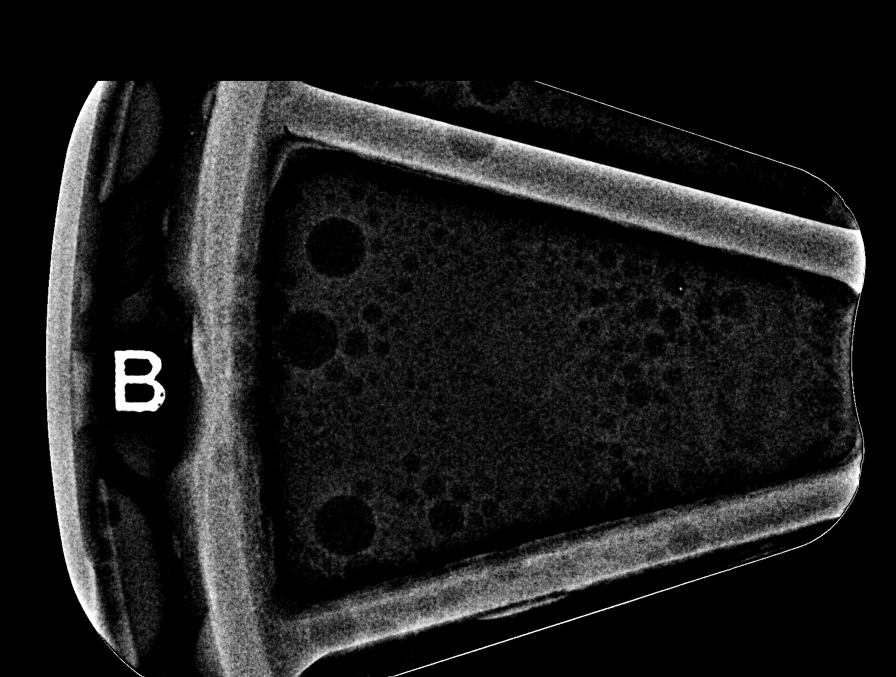

[R (6 of 8)]
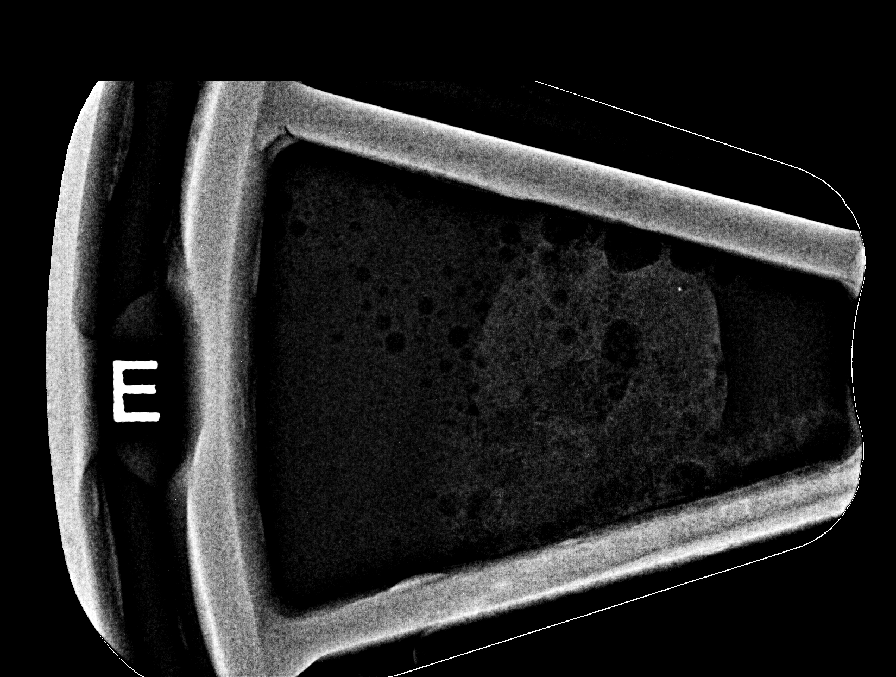

[R (7 of 8)]
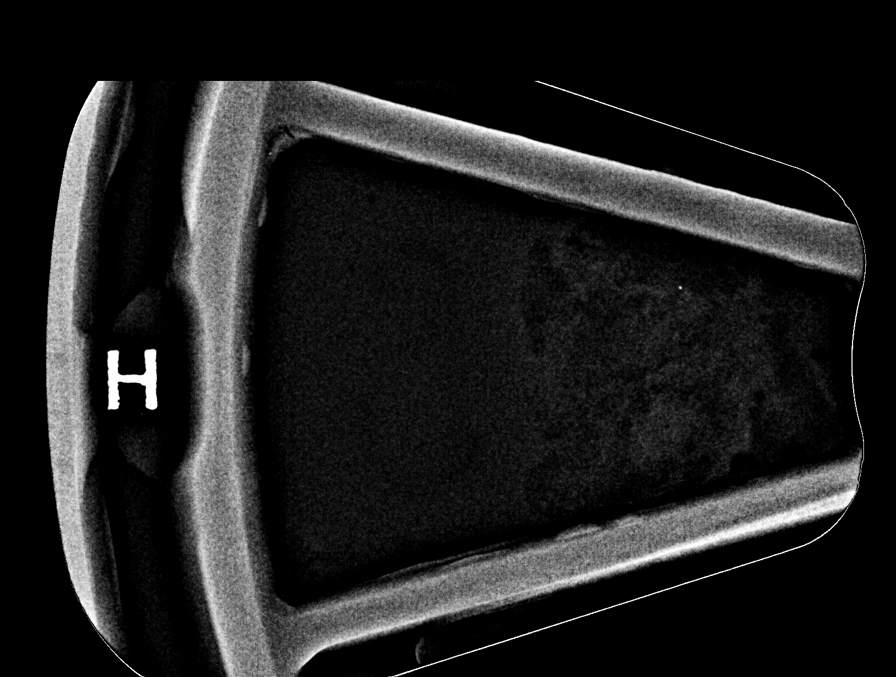

[R (8 of 8)]
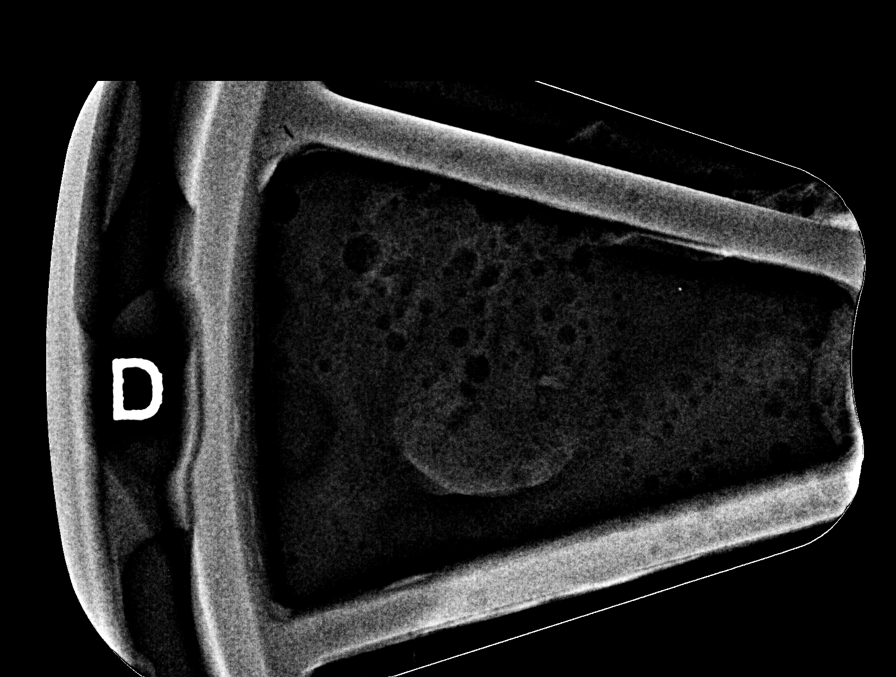

[8 of 24 positions shown; findings below may reference images not displayed]



Using sterile technique and 1% Lidocaine as local anesthetic, under
stereotactic guidance, a 9 gauge vacuum assisted device was used to
perform core needle biopsy of the recently demonstrated area of
subtle distortion in the upper-outer quadrant of the right breast
using a cephalad approach.

Lesion quadrant: Upper outer quadrant

At the conclusion of the procedure, a coil shaped tissue marker clip
was deployed into the biopsy cavity. Follow-up 2-view mammogram was
performed and dictated separately.
IMPRESSION: Stereotactic-guided biopsy of the recently demonstrated subtle area
of architectural distortion in the upper-outer quadrant of the
central right breast. No apparent complications.

## 2020-01-14 ENCOUNTER — Encounter: Payer: Self-pay | Admitting: Family Medicine

## 2020-01-14 MED ORDER — FAMOTIDINE 40 MG PO TABS: 40.0000 mg | ORAL_TABLET | Freq: Two times a day (BID) | ORAL | 1 refills | Status: DC

## 2020-02-09 ENCOUNTER — Other Ambulatory Visit: Payer: Self-pay | Admitting: Family Medicine

## 2020-02-09 MED ORDER — SYNTHROID 150 MCG PO TABS: 150.0000 ug | ORAL_TABLET | Freq: Every day | ORAL | 2 refills | Status: DC

## 2020-02-09 NOTE — Telephone Encounter (Signed)
Medication:SYNTHROID 150 MCG tablet [id number]   Has the patient contacted their pharmacy? No. (If no, request that the patient contact the pharmacy for the refill.) (If yes, when and what did the pharmacy advise?)  Preferred Pharmacy (with phone number or street name): [id number] - [place], [place] - [address]  [address] [place], [place] [address]  Phone:  [phone] Fax:  [phone]  DEA #:  [id number]  Agent: Please be advised that RX refills may take up to 3 business days. We ask that you follow-up with your pharmacy.

## 2020-02-09 NOTE — Telephone Encounter (Signed)
Patient notified that rx has been sent in. 

## 2020-03-23 ENCOUNTER — Encounter: Payer: Self-pay | Admitting: Family Medicine

## 2020-03-31 ENCOUNTER — Other Ambulatory Visit: Payer: Self-pay

## 2020-03-31 ENCOUNTER — Other Ambulatory Visit (INDEPENDENT_AMBULATORY_CARE_PROVIDER_SITE_OTHER): Payer: BLUE CROSS/BLUE SHIELD

## 2020-03-31 DIAGNOSIS — E039 Hypothyroidism, unspecified: Secondary | ICD-10-CM

## 2020-04-01 LAB — THYROID PANEL WITH TSH
Free Thyroxine Index: 3.6 (ref 1.4–3.8)
T3 Uptake: 30 % (ref 22–35)
T4, Total: 12.1 ug/dL — ABNORMAL HIGH (ref 5.1–11.9)
TSH: 1.05 mIU/L

## 2020-04-07 ENCOUNTER — Other Ambulatory Visit: Payer: Self-pay | Admitting: Family Medicine

## 2020-04-07 DIAGNOSIS — F988 Other specified behavioral and emotional disorders with onset usually occurring in childhood and adolescence: Secondary | ICD-10-CM

## 2020-04-07 MED ORDER — METHYLPHENIDATE HCL 10 MG PO TABS: 10.0000 mg | ORAL_TABLET | Freq: Three times a day (TID) | ORAL | 0 refills | Status: DC

## 2020-04-07 NOTE — Telephone Encounter (Signed)
Ritalin refill. [place] Pt.   Last OV: 12/08/2019 Last Fill: 12/08/2019 #90 and 0RF (For March, April, May 2021) Pt sig: 1 tab tid  UDS: 08/14/2018 Low risk

## 2020-05-03 ENCOUNTER — Other Ambulatory Visit: Payer: Self-pay | Admitting: Family Medicine

## 2020-05-05 DIAGNOSIS — D649 Anemia, unspecified: Secondary | ICD-10-CM | POA: Diagnosis not present

## 2020-05-05 DIAGNOSIS — N800 Endometriosis of uterus: Secondary | ICD-10-CM | POA: Diagnosis not present

## 2020-05-05 DIAGNOSIS — N939 Abnormal uterine and vaginal bleeding, unspecified: Secondary | ICD-10-CM | POA: Diagnosis not present

## 2020-05-05 DIAGNOSIS — R102 Pelvic and perineal pain: Secondary | ICD-10-CM | POA: Diagnosis not present

## 2020-05-05 DIAGNOSIS — Z3202 Encounter for pregnancy test, result negative: Secondary | ICD-10-CM | POA: Diagnosis not present

## 2020-05-06 ENCOUNTER — Other Ambulatory Visit: Payer: Self-pay | Admitting: Family Medicine

## 2020-05-06 DIAGNOSIS — F988 Other specified behavioral and emotional disorders with onset usually occurring in childhood and adolescence: Secondary | ICD-10-CM

## 2020-05-06 MED ORDER — METHYLPHENIDATE HCL 10 MG PO TABS: 10.0000 mg | ORAL_TABLET | Freq: Three times a day (TID) | ORAL | 0 refills | Status: DC

## 2020-05-06 NOTE — Telephone Encounter (Signed)
Definitely needs new contract and uds at next visit

## 2020-05-06 NOTE — Telephone Encounter (Signed)
Requesting: Ritalin Contract:08/22/2018 UDS:08/14/2018 Last Visit:12/08/2019 Next Visit: none scheduled Last Refill: 04/07/2020  Please Advise

## 2020-05-24 DIAGNOSIS — N920 Excessive and frequent menstruation with regular cycle: Secondary | ICD-10-CM | POA: Diagnosis not present

## 2020-05-24 DIAGNOSIS — D649 Anemia, unspecified: Secondary | ICD-10-CM | POA: Diagnosis not present

## 2020-05-24 DIAGNOSIS — N800 Endometriosis of uterus: Secondary | ICD-10-CM | POA: Diagnosis not present

## 2020-06-07 ENCOUNTER — Other Ambulatory Visit: Payer: Self-pay | Admitting: Family Medicine

## 2020-06-07 DIAGNOSIS — F988 Other specified behavioral and emotional disorders with onset usually occurring in childhood and adolescence: Secondary | ICD-10-CM

## 2020-06-07 MED ORDER — METHYLPHENIDATE HCL 10 MG PO TABS: 10.0000 mg | ORAL_TABLET | Freq: Three times a day (TID) | ORAL | 0 refills | Status: DC

## 2020-06-07 NOTE — Telephone Encounter (Signed)
Can you please do in [place] absence  Last written: 12/08/19 Last ov: 12/08/19 Next ov: none Contract: 08/14/18 UDS: 08/14/18

## 2020-06-29 DIAGNOSIS — Z6830 Body mass index (BMI) 30.0-30.9, adult: Secondary | ICD-10-CM | POA: Diagnosis not present

## 2020-06-29 DIAGNOSIS — Z1329 Encounter for screening for other suspected endocrine disorder: Secondary | ICD-10-CM | POA: Diagnosis not present

## 2020-06-29 DIAGNOSIS — Z01419 Encounter for gynecological examination (general) (routine) without abnormal findings: Secondary | ICD-10-CM | POA: Diagnosis not present

## 2020-06-29 DIAGNOSIS — Z131 Encounter for screening for diabetes mellitus: Secondary | ICD-10-CM | POA: Diagnosis not present

## 2020-06-29 DIAGNOSIS — Z13220 Encounter for screening for lipoid disorders: Secondary | ICD-10-CM | POA: Diagnosis not present

## 2020-06-29 DIAGNOSIS — Z1231 Encounter for screening mammogram for malignant neoplasm of breast: Secondary | ICD-10-CM | POA: Diagnosis not present

## 2020-07-03 ENCOUNTER — Other Ambulatory Visit: Payer: Self-pay | Admitting: Family Medicine

## 2020-07-04 ENCOUNTER — Other Ambulatory Visit: Payer: Self-pay | Admitting: Obstetrics and Gynecology

## 2020-07-04 DIAGNOSIS — N838 Other noninflammatory disorders of ovary, fallopian tube and broad ligament: Secondary | ICD-10-CM | POA: Diagnosis not present

## 2020-07-04 DIAGNOSIS — N800 Endometriosis of uterus: Secondary | ICD-10-CM | POA: Diagnosis not present

## 2020-07-04 DIAGNOSIS — N920 Excessive and frequent menstruation with regular cycle: Secondary | ICD-10-CM | POA: Diagnosis not present

## 2020-07-04 HISTORY — PX: TOTAL ABDOMINAL HYSTERECTOMY: SHX209

## 2020-07-05 ENCOUNTER — Other Ambulatory Visit: Payer: Self-pay

## 2020-07-05 DIAGNOSIS — F988 Other specified behavioral and emotional disorders with onset usually occurring in childhood and adolescence: Secondary | ICD-10-CM

## 2020-07-05 MED ORDER — METHYLPHENIDATE HCL 10 MG PO TABS: 10.0000 mg | ORAL_TABLET | Freq: Three times a day (TID) | ORAL | 0 refills | Status: DC

## 2020-07-05 NOTE — Telephone Encounter (Signed)
Requesting: Ritalin 10mg  Contract: 08/14/2018 UDS: 08/14/2018 +alcohol Last Visit: 12/08/2019 Next Visit: None scheduled Last Refill: 06/07/2020 #90 and 0RF Pt sig: 1 tab tid   Please Advise

## 2020-07-05 NOTE — Telephone Encounter (Signed)
Needs ov

## 2020-07-14 DIAGNOSIS — D509 Iron deficiency anemia, unspecified: Secondary | ICD-10-CM | POA: Diagnosis not present

## 2020-07-30 ENCOUNTER — Other Ambulatory Visit: Payer: Self-pay | Admitting: Family Medicine

## 2020-08-01 ENCOUNTER — Encounter: Payer: Self-pay | Admitting: Family Medicine

## 2020-08-09 ENCOUNTER — Other Ambulatory Visit: Payer: Self-pay | Admitting: Family Medicine

## 2020-08-09 DIAGNOSIS — F988 Other specified behavioral and emotional disorders with onset usually occurring in childhood and adolescence: Secondary | ICD-10-CM

## 2020-08-09 MED ORDER — METHYLPHENIDATE HCL 10 MG PO TABS: 10.0000 mg | ORAL_TABLET | Freq: Three times a day (TID) | ORAL | 0 refills | Status: DC

## 2020-08-09 NOTE — Telephone Encounter (Signed)
Your patient 

## 2020-08-09 NOTE — Telephone Encounter (Signed)
Requesting:Ritalin Contract:08/14/18 expired UDS:08/14/18 Last Visit:12/08/19 Next Visit:08/11/20 Last Refill:07/05/20  Please Advise

## 2020-08-11 ENCOUNTER — Encounter: Payer: Self-pay | Admitting: Family Medicine

## 2020-08-11 ENCOUNTER — Ambulatory Visit: Payer: BLUE CROSS/BLUE SHIELD | Admitting: Family Medicine

## 2020-08-11 ENCOUNTER — Other Ambulatory Visit: Payer: Self-pay

## 2020-08-11 VITALS — BP 120/90 | HR 57 | Temp 97.7°F | Resp 18 | Ht 66.0 in | Wt 189.0 lb

## 2020-08-11 DIAGNOSIS — F988 Other specified behavioral and emotional disorders with onset usually occurring in childhood and adolescence: Secondary | ICD-10-CM

## 2020-08-11 DIAGNOSIS — Z79899 Other long term (current) drug therapy: Secondary | ICD-10-CM

## 2020-08-11 DIAGNOSIS — E039 Hypothyroidism, unspecified: Secondary | ICD-10-CM | POA: Diagnosis not present

## 2020-08-11 MED ORDER — SYNTHROID 150 MCG PO TABS: ORAL_TABLET | ORAL | 3 refills | Status: DC

## 2020-08-11 NOTE — Assessment & Plan Note (Signed)
Stable Check uds/ contract F/u 6 months

## 2020-08-11 NOTE — Patient Instructions (Signed)

## 2020-08-11 NOTE — Progress Notes (Signed)
Patient ID: Shelby Warren, female    DOB: 06/17/1966  Age: 54 y.o. MRN: [id number]    Subjective:  Subjective  HPI LANEA VANKIRK presents for f/u add and thyroid   She had a tah -- 10/4  No complaints   Review of Systems  Constitutional: Negative for appetite change, diaphoresis, fatigue and unexpected weight change.  Eyes: Negative for pain, redness and visual disturbance.  Respiratory: Negative for cough, chest tightness, shortness of breath and wheezing.   Cardiovascular: Negative for chest pain, palpitations and leg swelling.  Endocrine: Negative for cold intolerance, heat intolerance, polydipsia, polyphagia and polyuria.  Genitourinary: Negative for difficulty urinating, dysuria and frequency.  Neurological: Negative for dizziness, light-headedness, numbness and headaches.    History Past Medical History:  Diagnosis Date  . ADD (attention deficit disorder)   . Allergic rhinitis   . Asthma   . B12 deficiency   . Hypothyroidism     She has a past surgical history that includes Anal fissure repair; spincture muscle; lasic eye surgery; and Total abdominal hysterectomy (07/04/2020).   Her family history includes Breast cancer in her paternal grandmother; Diabetes in her maternal grandfather; Thyroid disease in her paternal grandmother.She reports that she has never smoked. She has never used smokeless tobacco. She reports current alcohol use. She reports that she does not use drugs.  Current Outpatient Medications on File Prior to Visit  Medication Sig Dispense Refill  . doxepin (SINEQUAN) 10 MG capsule TAKE 1-3 CAPSULES BY MOUTH EVERY NIGHT AT BEDTIME AS NEEDED FOR HIVES 90 capsule 5  . famotidine (PEPCID) 40 MG tablet TAKE 1 TABLET(40 MG) BY MOUTH TWICE DAILY 180 tablet 1  . fexofenadine (ALLEGRA) 180 MG tablet Take 180 mg by mouth daily.    . methylphenidate (RITALIN) 10 MG tablet Take 1 tablet (10 mg total) by mouth 3 (three) times daily with meals. 90 tablet 0  .  methylphenidate (RITALIN) 10 MG tablet Take 1 tablet (10 mg total) by mouth 3 (three) times daily with meals. 90 tablet 0   No current facility-administered medications on file prior to visit.     Objective:  Objective  Physical Exam Constitutional:      Appearance: She is well-developed.  HENT:     Head: Normocephalic and atraumatic.  Eyes:     Conjunctiva/sclera: Conjunctivae normal.  Neck:     Thyroid: No thyromegaly.     Vascular: No carotid bruit or JVD.  Cardiovascular:     Rate and Rhythm: Normal rate and regular rhythm.     Heart sounds: Normal heart sounds. No murmur heard.   Pulmonary:     Effort: Pulmonary effort is normal. No respiratory distress.     Breath sounds: Normal breath sounds. No wheezing or rales.  Chest:     Chest wall: No tenderness.  Musculoskeletal:     Cervical back: Normal range of motion and neck supple.  Neurological:     Mental Status: She is alert and oriented to person, place, and time.    BP 120/90 (BP Location: Left Arm, Patient Position: Sitting, Cuff Size: Normal)   Pulse (!) 57   Temp 97.7 F (36.5 C) (Oral)   Resp 18   Ht 5\' 6"  (1.676 m)   Wt 189 lb (85.7 kg)   SpO2 100%   BMI 30.51 kg/m  Wt Readings from Last 3 Encounters:  08/11/20 189 lb (85.7 kg)  12/08/19 204 lb (92.5 kg)  04/14/19 199 lb 12.8 oz (90.6 kg)  Lab Results  Component Value Date   WBC 6.0 04/13/2015   HGB 13.4 04/13/2015   HCT 39.0 04/13/2015   PLT 269.0 04/13/2015   GLUCOSE 87 04/13/2015   CHOL 200 07/04/2012   TRIG 77.0 07/04/2012   HDL 61.30 07/04/2012   LDLCALC 123 (H) 07/04/2012   ALT 15 07/04/2012   AST 17 07/04/2012   NA 139 04/13/2015   K 4.5 04/13/2015   CL 104 04/13/2015   CREATININE 0.99 04/13/2015   BUN 16 04/13/2015   CO2 27 04/13/2015   TSH 1.05 03/31/2020    MM CLIP PLACEMENT RIGHT  Result Date: 03/26/2018 CLINICAL DATA:  Status post stereotactic guided core needle biopsy of an area of questionable, subtle architectural  distortion in the upper-outer quadrant of the right breast. EXAM: DIAGNOSTIC RIGHT MAMMOGRAM POST STEREOTACTIC BIOPSY COMPARISON:  Previous exam(s). FINDINGS: Mammographic images were obtained following 3D stereotactic guided biopsy of the recently suspected questionable, subtle architectural distortion in the upper-outer quadrant of the right breast. These demonstrate a coil shaped biopsy marker clip at the location of mammographic concern. There are post biopsy changes at the location of the recently suspected questionable, subtle architectural distortion in the upper-outer quadrant of the breast. IMPRESSION: Appropriate clip deployment following right breast 3D stereotactic guided core needle biopsy. Final Assessment: Post Procedure Mammograms for Marker Placement Electronically Signed   By: Claudie Revering M.D.   On: 03/26/2018 10:19   MM RT BREAST BX W LOC DEV 1ST LESION IMAGE BX SPEC STEREO GUIDE  Addendum Date: 03/27/2018   ADDENDUM REPORT: 03/27/2018 11:15 ADDENDUM: Pathology revealed FIBROCYSTIC CHANGE AND CALCIFICATIONS of RIGHT breast, upper outer quadrant. This was found to be concordant by Dr. Claudie Revering. Pathology results were discussed with the patient by telephone. The patient reported doing well after the biopsy with tenderness at the site. Post biopsy instructions and care were reviewed and questions were answered. The patient was encouraged to call [facility] for any additional concerns. The patient was instructed to return for annual screening mammography which she will have performed at [facility], [place], [place]. Pathology results reported by Roselind Messier, RN on 03/27/2018. Electronically Signed   By: Claudie Revering M.D.   On: 03/27/2018 11:15   Result Date: 03/27/2018 CLINICAL DATA:  Subtle area of distortion in the upper-outer quadrant of the central right breast at recent mammography with no ultrasound correlate. EXAM: LEFT BREAST STEREOTACTIC CORE  NEEDLE BIOPSY COMPARISON:  Previous exams. FINDINGS: The patient and I discussed the procedure of stereotactic-guided biopsy including benefits and alternatives. We discussed the high likelihood of a successful procedure. We discussed the risks of the procedure including infection, bleeding, tissue injury, clip migration, and inadequate sampling. Informed written consent was given. The usual time out protocol was performed immediately prior to the procedure. Using sterile technique and 1% Lidocaine as local anesthetic, under stereotactic guidance, a 9 gauge vacuum assisted device was used to perform core needle biopsy of the recently demonstrated area of subtle distortion in the upper-outer quadrant of the right breast using a cephalad approach. Lesion quadrant: Upper outer quadrant At the conclusion of the procedure, a coil shaped tissue marker clip was deployed into the biopsy cavity. Follow-up 2-view mammogram was performed and dictated separately. IMPRESSION: Stereotactic-guided biopsy of the recently demonstrated subtle area of architectural distortion in the upper-outer quadrant of the central right breast. No apparent complications. Electronically Signed: By: Claudie Revering M.D. On: 03/26/2018 09:59     Assessment &  Plan:  Plan  I have discontinued Rito Ehrlich Sovine's Aczone and minocycline. I am also having her maintain her fexofenadine, methylphenidate, doxepin, famotidine, methylphenidate, and Synthroid.  Meds ordered this encounter  Medications  . SYNTHROID 150 MCG tablet    Sig: TAKE 1 TABLET(150 MCG) BY MOUTH DAILY BEFORE BREAKFAST    Dispense:  90 tablet    Refill:  3    Problem List Items Addressed This Visit      Unprioritized   Attention deficit disorder    Stable Check uds/ contract F/u 6 months       Hypothyroidism - Primary    Check labs con't synthroid       Relevant Medications   SYNTHROID 150 MCG tablet   Other Relevant Orders   Thyroid Panel With TSH    Other  Visit Diagnoses    Need for influenza vaccination       High risk medication use       Relevant Orders   DRUG MONITORING, PANEL 8 WITH CONFIRMATION, URINE      Follow-up: Return in about 6 months (around 02/08/2021) for annual exam, fasting.  Ann Held, DO

## 2020-08-11 NOTE — Assessment & Plan Note (Signed)
Check labs con't synthroid 

## 2020-08-12 LAB — THYROID PANEL WITH TSH
Free Thyroxine Index: 4.4 — ABNORMAL HIGH (ref 1.4–3.8)
T3 Uptake: 29 % (ref 22–35)
T4, Total: 15 ug/dL — ABNORMAL HIGH (ref 5.1–11.9)
TSH: 0.99 mIU/L

## 2020-08-13 LAB — DRUG MONITORING, PANEL 8 WITH CONFIRMATION, URINE
6 Acetylmorphine: NEGATIVE ng/mL (ref <10)
Alcohol Metabolites: POSITIVE ng/mL — AB
Amphetamine: NEGATIVE ng/mL (ref <250)
Amphetamines: NEGATIVE ng/mL (ref <500)
Benzodiazepines: NEGATIVE ng/mL (ref <100)
Buprenorphine, Urine: NEGATIVE ng/mL (ref <5)
Cocaine Metabolite: NEGATIVE ng/mL (ref <150)
Creatinine: 195.7 mg/dL
Ethyl Glucuronide (ETG): 2379 ng/mL — ABNORMAL HIGH (ref <500)
Ethyl Sulfate (ETS): 611 ng/mL — ABNORMAL HIGH (ref <100)
MDMA: NEGATIVE ng/mL (ref <500)
Marijuana Metabolite: NEGATIVE ng/mL (ref <20)
Methamphetamine: NEGATIVE ng/mL (ref <250)
Opiates: NEGATIVE ng/mL (ref <100)
Oxidant: NEGATIVE ug/mL
Oxycodone: NEGATIVE ng/mL (ref <100)
pH: 5.4 (ref 4.5–9.0)

## 2020-08-13 LAB — DM TEMPLATE

## 2020-08-14 ENCOUNTER — Other Ambulatory Visit: Payer: Self-pay | Admitting: Family Medicine

## 2020-08-14 DIAGNOSIS — E039 Hypothyroidism, unspecified: Secondary | ICD-10-CM

## 2020-08-15 ENCOUNTER — Encounter: Payer: Self-pay | Admitting: Family Medicine

## 2020-08-15 MED ORDER — LEVOTHYROXINE SODIUM 137 MCG PO TABS: 137.0000 ug | ORAL_TABLET | Freq: Every day | ORAL | 2 refills | Status: DC

## 2020-09-08 ENCOUNTER — Other Ambulatory Visit: Payer: Self-pay | Admitting: Family Medicine

## 2020-09-08 DIAGNOSIS — F988 Other specified behavioral and emotional disorders with onset usually occurring in childhood and adolescence: Secondary | ICD-10-CM

## 2020-09-08 MED ORDER — METHYLPHENIDATE HCL 10 MG PO TABS: 10.0000 mg | ORAL_TABLET | Freq: Three times a day (TID) | ORAL | 0 refills | Status: DC

## 2020-09-08 NOTE — Telephone Encounter (Signed)
Requesting: methylphenidate 10mg  Contract: 08/11/2020 UDS: 08/11/2020 Last Visit: 08/11/2020 Next Visit: None scheduled Last Refill: 08/09/2020 #90 and 0RF Pt sig: 1 tab tid  Please Advise

## 2020-09-21 ENCOUNTER — Encounter: Payer: Self-pay | Admitting: Family Medicine

## 2020-09-22 ENCOUNTER — Encounter: Payer: Self-pay | Admitting: Internal Medicine

## 2020-09-22 ENCOUNTER — Telehealth (INDEPENDENT_AMBULATORY_CARE_PROVIDER_SITE_OTHER): Payer: BLUE CROSS/BLUE SHIELD | Admitting: Internal Medicine

## 2020-09-22 VITALS — Ht 66.0 in | Wt 180.0 lb

## 2020-09-22 DIAGNOSIS — J40 Bronchitis, not specified as acute or chronic: Secondary | ICD-10-CM

## 2020-09-22 MED ORDER — AZITHROMYCIN 250 MG PO TABS: ORAL_TABLET | ORAL | 0 refills | Status: DC

## 2020-09-22 MED ORDER — HYDROCODONE-HOMATROPINE 5-1.5 MG/5ML PO SYRP
5.0000 mL | ORAL_SOLUTION | Freq: Every evening | ORAL | 0 refills | Status: DC | PRN
Start: 2020-09-22 — End: 2020-11-26

## 2020-09-22 MED ORDER — ALBUTEROL SULFATE HFA 108 (90 BASE) MCG/ACT IN AERS: 2.0000 | INHALATION_SPRAY | Freq: Four times a day (QID) | RESPIRATORY_TRACT | 1 refills | Status: DC | PRN

## 2020-09-22 NOTE — Progress Notes (Signed)
Pre visit review using our clinic review tool, if applicable. No additional management support is needed unless otherwise documented below in the visit note. 

## 2020-09-22 NOTE — Progress Notes (Signed)
Subjective:    Patient ID: Shelby Warren, female    DOB: 1966/06/18, 54 y.o.   MRN: [id number]  DOS:  09/22/2020 Type of visit - description: Virtual Visit via Video Note  I connected with the above patient  by a video enabled telemedicine application and verified that I am speaking with the correct person using two identifiers.   THIS ENCOUNTER IS A VIRTUAL VISIT DUE TO COVID-19 - PATIENT WAS NOT SEEN IN THE OFFICE. PATIENT HAS CONSENTED TO VIRTUAL VISIT / TELEMEDICINE VISIT   Location of patient: home  Location of provider: office  Persons participating in the virtual visit: patient, provider   I discussed the limitations of evaluation and management by telemedicine and the availability of in person appointments. The patient expressed understanding and agreed to proceed.  Acute Symptoms a started 3 days ago: Initially had sore throat which is now resolved. Then developed hoarseness, cough, chest congestion.  + Small amount of greenish mucus/sputum. Cough is at times intense and she cannot sleep.  Has checked her temperature: no fever, no chills. No nausea vomiting No myalgias + Mild headache No rash. Minimal sinus congestion if any. No known exposure to anybody sick.    Review of Systems See above   Past Medical History:  Diagnosis Date  . ADD (attention deficit disorder)   . Allergic rhinitis   . Asthma   . B12 deficiency   . Hypothyroidism     Past Surgical History:  Procedure Laterality Date  . ANAL FISSURE REPAIR    . lasic eye surgery    . spincture muscle    . TOTAL ABDOMINAL HYSTERECTOMY  07/04/2020   dr Helane Rima    Allergies as of 09/22/2020      Reactions   Penicillins       Medication List       Accurate as of September 22, 2020 11:39 AM. If you have any questions, ask your nurse or doctor.        doxepin 10 MG capsule Commonly known as: SINEQUAN TAKE 1-3 CAPSULES BY MOUTH EVERY NIGHT AT BEDTIME AS NEEDED FOR HIVES   famotidine 40 MG  tablet Commonly known as: PEPCID TAKE 1 TABLET(40 MG) BY MOUTH TWICE DAILY   fexofenadine 180 MG tablet Commonly known as: ALLEGRA Take 180 mg by mouth daily.   levothyroxine 137 MCG tablet Commonly known as: Synthroid Take 1 tablet (137 mcg total) by mouth daily before breakfast.   methylphenidate 10 MG tablet Commonly known as: Ritalin Take 1 tablet (10 mg total) by mouth 3 (three) times daily with meals.   methylphenidate 10 MG tablet Commonly known as: Ritalin Take 1 tablet (10 mg total) by mouth 3 (three) times daily with meals.   phentermine 37.5 MG tablet Commonly known as: ADIPEX-P Take 37.5 mg by mouth daily.          Objective:   Physical Exam Ht 5\' 6"  (1.676 m)   Wt 180 lb (81.6 kg)   LMP 07/31/2018   BMI 29.05 kg/m  This is a virtual video visit, I noted some cough along with large airway chest congestion.  No respiratory distress.     Assessment    54 year old female, history of ADD, weight gain after hysterectomy, history of asthma, asymptomatic for 2 years, presents with:  Bronchitis: SX C/W bronchitis, does not look toxic, she had 3 Covid vaccinations and 2 days ago home test for Covid was negative. Recommend good hydration, Mucinex DM, Z-Pak, small amount of hydrocodone  at nighttime to help with a cough that is interfering with her sleep.  I will also rx albuterol just in case, to be used if she develops wheezing or has persisting cough. I also recommend to give another home Covid testing 1 or 2 days. A message will be sent with this information. She verbalized understanding.    I discussed the assessment and treatment plan with the patient. The patient was provided an opportunity to ask questions and all were answered. The patient agreed with the plan and demonstrated an understanding of the instructions.   The patient was advised to call back or seek an in-person evaluation if the symptoms worsen or if the condition fails to improve as  anticipated.

## 2020-09-30 ENCOUNTER — Other Ambulatory Visit: Payer: Self-pay | Admitting: Family Medicine

## 2020-09-30 DIAGNOSIS — T7840XA Allergy, unspecified, initial encounter: Secondary | ICD-10-CM

## 2020-10-10 ENCOUNTER — Other Ambulatory Visit: Payer: Self-pay | Admitting: Family Medicine

## 2020-10-10 DIAGNOSIS — F988 Other specified behavioral and emotional disorders with onset usually occurring in childhood and adolescence: Secondary | ICD-10-CM

## 2020-10-10 MED ORDER — METHYLPHENIDATE HCL 10 MG PO TABS
10.0000 mg | ORAL_TABLET | Freq: Three times a day (TID) | ORAL | 0 refills | Status: DC
Start: 2020-10-10 — End: 2020-11-08

## 2020-10-10 NOTE — Telephone Encounter (Signed)
Requesting:Methylephendiate 10 mg Contract:08-14-18 UDS:08-11-20 Last Visit:09-22-20 video visit Next Visit:n/a Last Refill:08-09-20  Please Advise

## 2020-10-31 ENCOUNTER — Other Ambulatory Visit: Payer: Self-pay

## 2020-10-31 ENCOUNTER — Other Ambulatory Visit (INDEPENDENT_AMBULATORY_CARE_PROVIDER_SITE_OTHER): Payer: BLUE CROSS/BLUE SHIELD

## 2020-10-31 DIAGNOSIS — E039 Hypothyroidism, unspecified: Secondary | ICD-10-CM

## 2020-10-31 LAB — TSH: TSH: 1.18 u[IU]/mL (ref 0.35–4.50)

## 2020-11-08 ENCOUNTER — Other Ambulatory Visit: Payer: Self-pay | Admitting: Family Medicine

## 2020-11-08 ENCOUNTER — Other Ambulatory Visit: Payer: Self-pay | Admitting: Internal Medicine

## 2020-11-08 DIAGNOSIS — F988 Other specified behavioral and emotional disorders with onset usually occurring in childhood and adolescence: Secondary | ICD-10-CM

## 2020-11-09 ENCOUNTER — Other Ambulatory Visit: Payer: Self-pay | Admitting: Family Medicine

## 2020-11-09 MED ORDER — METHYLPHENIDATE HCL 10 MG PO TABS: 10.0000 mg | ORAL_TABLET | Freq: Three times a day (TID) | ORAL | 0 refills | Status: DC

## 2020-11-09 NOTE — Telephone Encounter (Signed)
Requesting:methylphenidate 10mg  Contract: 08/11/2020 UDS: 08/11/2020 Last Visit: 09/22/2020 w/ Dr. Larose Kells Next Visit: None Last Refill: 10/10/2020 #90 and 0RF Pt sig: 1 tab tid   Please Advise

## 2020-11-10 ENCOUNTER — Other Ambulatory Visit: Payer: Self-pay

## 2020-11-10 ENCOUNTER — Encounter: Payer: Self-pay | Admitting: Family Medicine

## 2020-11-10 DIAGNOSIS — E039 Hypothyroidism, unspecified: Secondary | ICD-10-CM

## 2020-11-10 MED ORDER — LEVOTHYROXINE SODIUM 137 MCG PO TABS: 137.0000 ug | ORAL_TABLET | Freq: Every day | ORAL | 3 refills | Status: DC

## 2020-11-25 ENCOUNTER — Encounter: Payer: Self-pay | Admitting: Family Medicine

## 2020-11-26 ENCOUNTER — Other Ambulatory Visit: Payer: Self-pay | Admitting: Internal Medicine

## 2020-11-28 ENCOUNTER — Other Ambulatory Visit: Payer: Self-pay | Admitting: Family Medicine

## 2020-11-28 DIAGNOSIS — R059 Cough, unspecified: Secondary | ICD-10-CM

## 2020-11-28 MED ORDER — PROMETHAZINE-DM 6.25-15 MG/5ML PO SYRP: 5.0000 mL | ORAL_SOLUTION | Freq: Four times a day (QID) | ORAL | 0 refills | Status: DC | PRN

## 2020-11-28 MED ORDER — HYDROCODONE-HOMATROPINE 5-1.5 MG/5ML PO SYRP: 5.0000 mL | ORAL_SOLUTION | Freq: Every evening | ORAL | 0 refills | Status: DC | PRN

## 2020-11-28 NOTE — Telephone Encounter (Signed)
I sent in a cough med

## 2020-11-28 NOTE — Telephone Encounter (Signed)
Requesting:Hydrocodone 5-1.5 Contract:  not on file  UDS: 07/17/17 Last Visit: 11/25/20 Next Visit: n/a Last Refill:   Please Advise

## 2020-11-29 ENCOUNTER — Other Ambulatory Visit: Payer: Self-pay

## 2020-11-29 ENCOUNTER — Encounter: Payer: Self-pay | Admitting: Family Medicine

## 2020-11-29 ENCOUNTER — Telehealth (INDEPENDENT_AMBULATORY_CARE_PROVIDER_SITE_OTHER): Payer: BLUE CROSS/BLUE SHIELD | Admitting: Family Medicine

## 2020-11-29 VITALS — Ht 66.0 in | Wt 183.0 lb

## 2020-11-29 DIAGNOSIS — J40 Bronchitis, not specified as acute or chronic: Secondary | ICD-10-CM | POA: Diagnosis not present

## 2020-11-29 MED ORDER — AZITHROMYCIN 250 MG PO TABS: ORAL_TABLET | ORAL | 0 refills | Status: DC

## 2020-11-29 NOTE — Progress Notes (Signed)
Virtual Visit via Video Note  I connected with Shelby Warren on 11/29/20 at  2:40 PM EST by a video enabled telemedicine application and verified that I am speaking with the correct person using two identifiers.  Location. / participants  Patient: home alone  Provider: office    I discussed the limitations of evaluation and management by telemedicine and the availability of in person appointments. The patient expressed understanding and agreed to proceed.  History of Present Illness: Pt is home c/o cough and congestion since Thursday night.   No fevers or body aches  Cough is slightly productive    Observations/Objective: There were no vitals filed for this visit. Pt is in nad  Assessment and Plan: 1. Bronchitis We sent cough meds in last night --- phenergan dm  abx per orders  con't albuterol prn  - azithromycin (ZITHROMAX Z-PAK) 250 MG tablet; As directed  Dispense: 6 each; Refill: 0   Follow Up Instructions:    I discussed the assessment and treatment plan with the patient. The patient was provided an opportunity to ask questions and all were answered. The patient agreed with the plan and demonstrated an understanding of the instructions.   The patient was advised to call back or seek an in-person evaluation if the symptoms worsen or if the condition fails to improve as anticipated.     Ann Held, DO

## 2020-12-09 ENCOUNTER — Other Ambulatory Visit: Payer: Self-pay | Admitting: Family Medicine

## 2020-12-09 DIAGNOSIS — F988 Other specified behavioral and emotional disorders with onset usually occurring in childhood and adolescence: Secondary | ICD-10-CM

## 2020-12-09 MED ORDER — METHYLPHENIDATE HCL 10 MG PO TABS: 10.0000 mg | ORAL_TABLET | Freq: Three times a day (TID) | ORAL | 0 refills | Status: DC

## 2020-12-09 NOTE — Telephone Encounter (Signed)
Requesting: Ritalin Contract: 08/14/18 UDS: 08/11/20 Last Visit: 08/11/20 Next Visit: none Last Refill: 11/09/20  Please Advise

## 2021-01-09 ENCOUNTER — Other Ambulatory Visit: Payer: Self-pay | Admitting: Family Medicine

## 2021-01-09 DIAGNOSIS — F988 Other specified behavioral and emotional disorders with onset usually occurring in childhood and adolescence: Secondary | ICD-10-CM

## 2021-01-09 MED ORDER — METHYLPHENIDATE HCL 10 MG PO TABS: 10.0000 mg | ORAL_TABLET | Freq: Three times a day (TID) | ORAL | 0 refills | Status: DC

## 2021-01-09 NOTE — Telephone Encounter (Signed)
Requesting: Ritalin 10mg   Contract: 08/11/2020 UDS: 08/11/2020 Last Visit: 11/29/20 Next Visit: None Last Refill: 12/09/2020 #90 and 0RF Pt sig: 1 tab tid   Please Advise

## 2021-01-11 ENCOUNTER — Other Ambulatory Visit: Payer: Self-pay

## 2021-01-11 MED ORDER — FAMOTIDINE 40 MG PO TABS: ORAL_TABLET | ORAL | 1 refills | Status: DC

## 2021-02-08 ENCOUNTER — Other Ambulatory Visit: Payer: Self-pay | Admitting: Family Medicine

## 2021-02-08 DIAGNOSIS — F988 Other specified behavioral and emotional disorders with onset usually occurring in childhood and adolescence: Secondary | ICD-10-CM

## 2021-02-08 MED ORDER — METHYLPHENIDATE HCL 10 MG PO TABS
10.0000 mg | ORAL_TABLET | Freq: Three times a day (TID) | ORAL | 0 refills | Status: DC
Start: 2021-02-08 — End: 2021-02-10

## 2021-02-08 NOTE — Telephone Encounter (Signed)
Requesting: Ritalin 10mg  Contract: 08/11/2020 UDS: 08/11/2020 Last Visit: 11/29/2020 Next Visit: None Last Refill: 01/09/2021 #90 and 0RF Pt sig: 1 tab tid   Please Advise

## 2021-02-09 ENCOUNTER — Encounter: Payer: Self-pay | Admitting: Family Medicine

## 2021-02-10 ENCOUNTER — Other Ambulatory Visit: Payer: Self-pay | Admitting: Family Medicine

## 2021-02-10 DIAGNOSIS — F988 Other specified behavioral and emotional disorders with onset usually occurring in childhood and adolescence: Secondary | ICD-10-CM

## 2021-02-10 MED ORDER — METHYLPHENIDATE HCL 20 MG PO TABS: ORAL_TABLET | ORAL | 0 refills | Status: DC

## 2021-02-10 MED ORDER — METHYLPHENIDATE HCL 20 MG PO TABS
ORAL_TABLET | ORAL | 0 refills | Status: DC
Start: 2021-02-10 — End: 2021-02-10

## 2021-02-10 NOTE — Telephone Encounter (Signed)
My mistake--- I was reading what phamacy said to send -- it should be 1/2 tab tid ----  will pharmacy give her 30 more pills if we send them in ?

## 2021-02-10 NOTE — Telephone Encounter (Signed)
Sent in 20 mg so she can take 1/2--- she will need to let us know when they get the 10 mg in again

## 2021-02-10 NOTE — Telephone Encounter (Signed)
They should if you notate the correction in the pharmacy note

## 2021-02-10 NOTE — Telephone Encounter (Signed)
I sent an extra 30 with note to pharmacy-

## 2021-03-07 ENCOUNTER — Other Ambulatory Visit: Payer: Self-pay | Admitting: Family Medicine

## 2021-03-07 DIAGNOSIS — E039 Hypothyroidism, unspecified: Secondary | ICD-10-CM

## 2021-04-06 ENCOUNTER — Encounter: Payer: Self-pay | Admitting: Family Medicine

## 2021-04-06 DIAGNOSIS — F988 Other specified behavioral and emotional disorders with onset usually occurring in childhood and adolescence: Secondary | ICD-10-CM

## 2021-04-07 ENCOUNTER — Other Ambulatory Visit: Payer: Self-pay | Admitting: Family Medicine

## 2021-04-07 DIAGNOSIS — F988 Other specified behavioral and emotional disorders with onset usually occurring in childhood and adolescence: Secondary | ICD-10-CM

## 2021-04-07 MED ORDER — METHYLPHENIDATE HCL 10 MG PO TABS
10.0000 mg | ORAL_TABLET | Freq: Three times a day (TID) | ORAL | 0 refills | Status: DC
Start: 2021-04-07 — End: 2021-05-08

## 2021-05-04 ENCOUNTER — Encounter: Payer: Self-pay | Admitting: Family Medicine

## 2021-05-08 ENCOUNTER — Other Ambulatory Visit: Payer: Self-pay | Admitting: Family Medicine

## 2021-05-08 ENCOUNTER — Encounter: Payer: Self-pay | Admitting: Family Medicine

## 2021-05-08 DIAGNOSIS — F988 Other specified behavioral and emotional disorders with onset usually occurring in childhood and adolescence: Secondary | ICD-10-CM

## 2021-05-08 MED ORDER — METHYLPHENIDATE HCL 10 MG PO TABS: 10.0000 mg | ORAL_TABLET | Freq: Three times a day (TID) | ORAL | 0 refills | Status: DC

## 2021-05-10 ENCOUNTER — Other Ambulatory Visit: Payer: Self-pay | Admitting: Family Medicine

## 2021-05-10 ENCOUNTER — Encounter: Payer: Self-pay | Admitting: Family Medicine

## 2021-05-10 DIAGNOSIS — E039 Hypothyroidism, unspecified: Secondary | ICD-10-CM

## 2021-05-11 ENCOUNTER — Other Ambulatory Visit (INDEPENDENT_AMBULATORY_CARE_PROVIDER_SITE_OTHER): Payer: BLUE CROSS/BLUE SHIELD

## 2021-05-11 ENCOUNTER — Other Ambulatory Visit: Payer: Self-pay

## 2021-05-11 DIAGNOSIS — E039 Hypothyroidism, unspecified: Secondary | ICD-10-CM | POA: Diagnosis not present

## 2021-05-12 LAB — THYROID PANEL WITH TSH
Free Thyroxine Index: 3 (ref 1.4–3.8)
T3 Uptake: 27 % (ref 22–35)
T4, Total: 11.1 ug/dL (ref 5.1–11.9)
TSH: 6.27 mIU/L — ABNORMAL HIGH

## 2021-05-15 ENCOUNTER — Other Ambulatory Visit: Payer: Self-pay | Admitting: Family Medicine

## 2021-05-15 DIAGNOSIS — E039 Hypothyroidism, unspecified: Secondary | ICD-10-CM

## 2021-05-15 MED ORDER — LEVOTHYROXINE SODIUM 150 MCG PO TABS: 150.0000 ug | ORAL_TABLET | Freq: Every day | ORAL | 2 refills | Status: DC

## 2021-05-29 ENCOUNTER — Encounter: Payer: BLUE CROSS/BLUE SHIELD | Admitting: Family Medicine

## 2021-06-06 ENCOUNTER — Other Ambulatory Visit: Payer: Self-pay

## 2021-06-06 ENCOUNTER — Ambulatory Visit: Payer: BLUE CROSS/BLUE SHIELD | Admitting: Family Medicine

## 2021-06-06 ENCOUNTER — Encounter: Payer: Self-pay | Admitting: Family Medicine

## 2021-06-06 VITALS — BP 138/90 | HR 81 | Temp 97.9°F | Resp 18 | Ht 65.0 in | Wt 190.0 lb

## 2021-06-06 DIAGNOSIS — G4700 Insomnia, unspecified: Secondary | ICD-10-CM

## 2021-06-06 DIAGNOSIS — E039 Hypothyroidism, unspecified: Secondary | ICD-10-CM | POA: Diagnosis not present

## 2021-06-06 DIAGNOSIS — F988 Other specified behavioral and emotional disorders with onset usually occurring in childhood and adolescence: Secondary | ICD-10-CM

## 2021-06-06 DIAGNOSIS — Z79899 Other long term (current) drug therapy: Secondary | ICD-10-CM | POA: Diagnosis not present

## 2021-06-06 DIAGNOSIS — Z1159 Encounter for screening for other viral diseases: Secondary | ICD-10-CM

## 2021-06-06 MED ORDER — METHYLPHENIDATE HCL 10 MG PO TABS: 10.0000 mg | ORAL_TABLET | Freq: Three times a day (TID) | ORAL | 0 refills | Status: DC

## 2021-06-06 MED ORDER — TRAZODONE HCL 50 MG PO TABS: 25.0000 mg | ORAL_TABLET | Freq: Every evening | ORAL | 3 refills | Status: DC | PRN

## 2021-06-06 NOTE — Patient Instructions (Signed)
Insomnia Insomnia is a sleep disorder that makes it difficult to fall asleep or stay asleep. Insomnia can cause fatigue, low energy, difficulty concentrating, moodswings, and poor performance at work or school. There are three different ways to classify insomnia: Difficulty falling asleep. Difficulty staying asleep. Waking up too early in the morning. Any type of insomnia can be long-term (chronic) or short-term (acute). Both are common. Short-term insomnia usually lasts for three months or less. Chronic insomnia occurs at least three times a week for longer than threemonths. What are the causes? Insomnia may be caused by another condition, situation, or substance, such as: Anxiety. Certain medicines. Gastroesophageal reflux disease (GERD) or other gastrointestinal conditions. Asthma or other breathing conditions. Restless legs syndrome, sleep apnea, or other sleep disorders. Chronic pain. Menopause. Stroke. Abuse of alcohol, tobacco, or illegal drugs. Mental health conditions, such as depression. Caffeine. Neurological disorders, such as Alzheimer's disease. An overactive thyroid (hyperthyroidism). Sometimes, the cause of insomnia may not be known. What increases the risk? Risk factors for insomnia include: Gender. Women are affected more often than men. Age. Insomnia is more common as you get older. Stress. Lack of exercise. Irregular work schedule or working night shifts. Traveling between different time zones. Certain medical and mental health conditions. What are the signs or symptoms? If you have insomnia, the main symptom is having trouble falling asleep or having trouble staying asleep. This may lead to other symptoms, such as: Feeling fatigued or having low energy. Feeling nervous about going to sleep. Not feeling rested in the morning. Having trouble concentrating. Feeling irritable, anxious, or depressed. How is this diagnosed? This condition may be diagnosed based  on: Your symptoms and medical history. Your health care provider may ask about: Your sleep habits. Any medical conditions you have. Your mental health. A physical exam. How is this treated? Treatment for insomnia depends on the cause. Treatment may focus on treating an underlying condition that is causing insomnia. Treatment may also include: Medicines to help you sleep. Counseling or therapy. Lifestyle adjustments to help you sleep better. Follow these instructions at home: Eating and drinking  Limit or avoid alcohol, caffeinated beverages, and cigarettes, especially close to bedtime. These can disrupt your sleep. Do not eat a large meal or eat spicy foods right before bedtime. This can lead to digestive discomfort that can make it hard for you to sleep.  Sleep habits  Keep a sleep diary to help you and your health care provider figure out what could be causing your insomnia. Write down: When you sleep. When you wake up during the night. How well you sleep. How rested you feel the next day. Any side effects of medicines you are taking. What you eat and drink. Make your bedroom a dark, comfortable place where it is easy to fall asleep. Put up shades or blackout curtains to block light from outside. Use a white noise machine to block noise. Keep the temperature cool. Limit screen use before bedtime. This includes: Watching TV. Using your smartphone, tablet, or computer. Stick to a routine that includes going to bed and waking up at the same times every day and night. This can help you fall asleep faster. Consider making a quiet activity, such as reading, part of your nighttime routine. Try to avoid taking naps during the day so that you sleep better at night. Get out of bed if you are still awake after 15 minutes of trying to sleep. Keep the lights down, but try reading or doing a quiet   activity. When you feel sleepy, go back to bed.  General instructions Take over-the-counter  and prescription medicines only as told by your health care provider. Exercise regularly, as told by your health care provider. Avoid exercise starting several hours before bedtime. Use relaxation techniques to manage stress. Ask your health care provider to suggest some techniques that may work well for you. These may include: Breathing exercises. Routines to release muscle tension. Visualizing peaceful scenes. Make sure that you drive carefully. Avoid driving if you feel very sleepy. Keep all follow-up visits as told by your health care provider. This is important. Contact a health care provider if: You are tired throughout the day. You have trouble in your daily routine due to sleepiness. You continue to have sleep problems, or your sleep problems get worse. Get help right away if: You have serious thoughts about hurting yourself or someone else. If you ever feel like you may hurt yourself or others, or have thoughts about taking your own life, get help right away. You can go to your nearest emergency department or call: Your local emergency services (911 in the [place] A suicide crisis helpline, such as the National Suicide Prevention Lifeline at 1-800-273-8255. This is open 24 hours a day. Summary Insomnia is a sleep disorder that makes it difficult to fall asleep or stay asleep. Insomnia can be long-term (chronic) or short-term (acute). Treatment for insomnia depends on the cause. Treatment may focus on treating an underlying condition that is causing insomnia. Keep a sleep diary to help you and your health care provider figure out what could be causing your insomnia. This information is not intended to replace advice given to you by your health care provider. Make sure you discuss any questions you have with your healthcare provider. Document Revised: 07/28/2020 Document Reviewed: 07/28/2020 Elsevier Patient Education  2022 [employer]  

## 2021-06-06 NOTE — Assessment & Plan Note (Signed)
con't meds Update uds/ contract and database reviewed

## 2021-06-06 NOTE — Assessment & Plan Note (Signed)
Check labs in 1 month

## 2021-06-06 NOTE — Progress Notes (Signed)
Subjective:   By signing my name below, I, Shehryar Baig, attest that this documentation has been prepared under the direction and in the presence of Dr. Roma Schanz, DO. 06/06/2021   Patient ID: Shelby Warren, female    DOB: [birth year], 56 y.o.   MRN: [id number]  Chief Complaint  Patient presents with   Hypothyroidism   ADD   Follow-up    HPI Patient is in today for an office visit.  She complains of insomnia for the past year since her hysterectomy. She continues taking 10 mg doxepin daily PO at around 6pm and reports no new issues while taking it. She notes she was given this medication to manage her Hives symptoms. She also continues taking 37.5 mg phentermine daily PO and reports no new issues while taking it.  She needs a refill today for 10 mg ritalin.  She is interested in getting her flu vaccine at a later date. She has had 3 Covid-19 vaccines and has expressed interest in receiving the new booster in the fall once it is released.    Past Medical History:  Diagnosis Date   ADD (attention deficit disorder)    Allergic rhinitis    Asthma    B12 deficiency    Hypothyroidism     Past Surgical History:  Procedure Laterality Date   ANAL FISSURE REPAIR     lasic eye surgery     spincture muscle     TOTAL ABDOMINAL HYSTERECTOMY  07/04/2020   dr Helane Rima    Family History  Problem Relation Age of Onset   Breast cancer Paternal Grandmother    Thyroid disease Paternal Grandmother    Diabetes Maternal Grandfather     Social History   Socioeconomic History   Marital status: Married    Spouse name: Not on file   Number of children: 2   Years of education: Not on file   Highest education level: Not on file  Occupational History    Employer: [employer]   Occupation: Homemaker  Tobacco Use   Smoking status: Never   Smokeless tobacco: Never  Vaping Use   Vaping Use: Never used  Substance and Sexual Activity   Alcohol use: Yes    Comment: occasional     Drug use: No   Sexual activity: Not on file  Other Topics Concern   Not on file  Social History Narrative   Not on file   Social Determinants of Health   Financial Resource Strain: Not on file  Food Insecurity: Not on file  Transportation Needs: Not on file  Physical Activity: Not on file  Stress: Not on file  Social Connections: Not on file  Intimate Partner Violence: Not on file    Outpatient Medications Prior to Visit  Medication Sig Dispense Refill   albuterol (VENTOLIN HFA) 108 (90 Base) MCG/ACT inhaler Inhale 2 puffs into the lungs every 6 (six) hours as needed for wheezing or shortness of breath. 18 g 5   famotidine (PEPCID) 40 MG tablet TAKE 1 TABLET(40 MG) BY MOUTH TWICE DAILY 180 tablet 1   levothyroxine (SYNTHROID) 150 MCG tablet Take 1 tablet (150 mcg total) by mouth daily before breakfast. 30 tablet 2   phentermine (ADIPEX-P) 37.5 MG tablet Take 37.5 mg by mouth daily.     doxepin (SINEQUAN) 10 MG capsule Take 1-3 capsules (10-30 mg total) by mouth at bedtime as needed. 90 capsule 5   methylphenidate (RITALIN) 10 MG tablet Take 1 tablet (10 mg total)  by mouth 3 (three) times daily with meals. 90 tablet 0   azithromycin (ZITHROMAX Z-PAK) 250 MG tablet As directed (Patient not taking: Reported on 06/06/2021) 6 each 0   promethazine-dextromethorphan (PROMETHAZINE-DM) 6.25-15 MG/5ML syrup Take 5 mLs by mouth 4 (four) times daily as needed. (Patient not taking: Reported on 06/06/2021) 118 mL 0   No facility-administered medications prior to visit.    Allergies  Allergen Reactions   Penicillins     Review of Systems  Constitutional:  Negative for fever and malaise/fatigue.  HENT:  Negative for congestion.   Eyes:  Negative for blurred vision.  Respiratory:  Negative for shortness of breath.   Cardiovascular:  Negative for chest pain, palpitations and leg swelling.  Gastrointestinal:  Negative for abdominal pain, blood in stool and nausea.  Genitourinary:  Negative  for dysuria and frequency.  Musculoskeletal:  Negative for falls.  Skin:  Negative for rash.  Neurological:  Negative for dizziness, loss of consciousness and headaches.  Endo/Heme/Allergies:  Negative for environmental allergies.  Psychiatric/Behavioral:  Negative for depression. The patient has insomnia. The patient is not nervous/anxious.       Objective:    Physical Exam Vitals and nursing note reviewed.  Constitutional:      General: She is not in acute distress.    Appearance: Normal appearance. She is well-developed. She is not ill-appearing.  HENT:     Head: Normocephalic and atraumatic.     Right Ear: External ear normal.     Left Ear: External ear normal.  Eyes:     Extraocular Movements: Extraocular movements intact.     Conjunctiva/sclera: Conjunctivae normal.     Pupils: Pupils are equal, round, and reactive to light.  Neck:     Thyroid: No thyromegaly.     Vascular: No carotid bruit or JVD.  Cardiovascular:     Rate and Rhythm: Normal rate and regular rhythm.     Heart sounds: Normal heart sounds. No murmur heard.   No gallop.  Pulmonary:     Effort: Pulmonary effort is normal. No respiratory distress.     Breath sounds: Normal breath sounds. No wheezing or rales.  Chest:     Chest wall: No tenderness.  Musculoskeletal:     Cervical back: Normal range of motion and neck supple.  Skin:    General: Skin is warm and dry.  Neurological:     Mental Status: She is alert and oriented to person, place, and time.  Psychiatric:        Behavior: Behavior normal.        Judgment: Judgment normal.    BP 138/90 (BP Location: Right Arm, Patient Position: Sitting, Cuff Size: Normal)   Pulse 81   Temp 97.9 F (36.6 C) (Oral)   Resp 18   Ht '5\' 5"'$  (1.651 m)   Wt 190 lb (86.2 kg)   LMP 07/31/2018   SpO2 98%   BMI 31.62 kg/m  Wt Readings from Last 3 Encounters:  06/06/21 190 lb (86.2 kg)  11/29/20 183 lb (83 kg)  09/22/20 180 lb (81.6 kg)    Diabetic Foot Exam  - Simple   No data filed    Lab Results  Component Value Date   WBC 6.0 04/13/2015   HGB 13.4 04/13/2015   HCT 39.0 04/13/2015   PLT 269.0 04/13/2015   GLUCOSE 87 04/13/2015   CHOL 200 07/04/2012   TRIG 77.0 07/04/2012   HDL 61.30 07/04/2012   LDLCALC 123 (H) 07/04/2012   ALT  15 07/04/2012   AST 17 07/04/2012   NA 139 04/13/2015   K 4.5 04/13/2015   CL 104 04/13/2015   CREATININE 0.99 04/13/2015   BUN 16 04/13/2015   CO2 27 04/13/2015   TSH 6.27 (H) 05/11/2021    Lab Results  Component Value Date   TSH 6.27 (H) 05/11/2021   Lab Results  Component Value Date   WBC 6.0 04/13/2015   HGB 13.4 04/13/2015   HCT 39.0 04/13/2015   MCV 90.7 04/13/2015   PLT 269.0 04/13/2015   Lab Results  Component Value Date   NA 139 04/13/2015   K 4.5 04/13/2015   CO2 27 04/13/2015   GLUCOSE 87 04/13/2015   BUN 16 04/13/2015   CREATININE 0.99 04/13/2015   BILITOT 1.0 07/04/2012   ALKPHOS 26 (L) 07/04/2012   AST 17 07/04/2012   ALT 15 07/04/2012   PROT 7.4 07/04/2012   ALBUMIN 4.1 07/04/2012   CALCIUM 9.3 04/13/2015   GFR 63.54 04/13/2015   Lab Results  Component Value Date   CHOL 200 07/04/2012   Lab Results  Component Value Date   HDL 61.30 07/04/2012   Lab Results  Component Value Date   LDLCALC 123 (H) 07/04/2012   Lab Results  Component Value Date   TRIG 77.0 07/04/2012   Lab Results  Component Value Date   CHOLHDL 3 07/04/2012   No results found for: HGBA1C     Assessment & Plan:   Problem List Items Addressed This Visit       Unprioritized   Attention deficit disorder - Primary    con't meds Update uds/ contract and database reviewed       Relevant Medications   methylphenidate (RITALIN) 10 MG tablet   Hypothyroidism    Check labs in 1 month      Relevant Orders   Thyroid Panel With TSH   Other Visit Diagnoses     High risk medication use       Relevant Orders   Drug Monitoring Panel [phone] , Urine   Insomnia, unspecified type        Relevant Medications   traZODone (DESYREL) 50 MG tablet   Need for hepatitis C screening test       Relevant Orders   Hepatitis C antibody        Meds ordered this encounter  Medications   methylphenidate (RITALIN) 10 MG tablet    Sig: Take 1 tablet (10 mg total) by mouth 3 (three) times daily with meals.    Dispense:  90 tablet    Refill:  0   traZODone (DESYREL) 50 MG tablet    Sig: Take 0.5-1 tablets (25-50 mg total) by mouth at bedtime as needed for sleep.    Dispense:  30 tablet    Refill:  3    I, Dr. Roma Schanz, DO., personally preformed the services described in this documentation.  All medical record entries made by the scribe were at my direction and in my presence.  I have reviewed the chart and discharge instructions (if applicable) and agree that the record reflects my personal performance and is accurate and complete. 06/06/2021   I,Shehryar Baig,acting as a scribe for Ann Held, DO.,have documented all relevant documentation on the behalf of Ann Held, DO,as directed by  Ann Held, DO while in the presence of Ann Held, DO.    Ann Held, DO

## 2021-06-10 LAB — DRUG MONITORING PANEL 376104, URINE
Amphetamines: NEGATIVE ng/mL (ref <500)
Barbiturates: NEGATIVE ng/mL (ref <300)
Benzodiazepines: NEGATIVE ng/mL (ref <100)
Cocaine Metabolite: NEGATIVE ng/mL (ref <150)
Desmethyltramadol: NEGATIVE ng/mL (ref <100)
Opiates: NEGATIVE ng/mL (ref <100)
Oxycodone: NEGATIVE ng/mL (ref <100)
Tramadol: NEGATIVE ng/mL (ref <100)

## 2021-06-10 LAB — DM TEMPLATE

## 2021-06-26 ENCOUNTER — Encounter: Payer: Self-pay | Admitting: Family Medicine

## 2021-06-26 ENCOUNTER — Other Ambulatory Visit: Payer: Self-pay | Admitting: Family Medicine

## 2021-06-26 DIAGNOSIS — G4700 Insomnia, unspecified: Secondary | ICD-10-CM

## 2021-06-26 MED ORDER — TRAZODONE HCL 100 MG PO TABS: 100.0000 mg | ORAL_TABLET | Freq: Every day | ORAL | 0 refills | Status: DC

## 2021-07-06 ENCOUNTER — Other Ambulatory Visit: Payer: Self-pay | Admitting: Family Medicine

## 2021-07-06 DIAGNOSIS — F988 Other specified behavioral and emotional disorders with onset usually occurring in childhood and adolescence: Secondary | ICD-10-CM

## 2021-07-06 MED ORDER — METHYLPHENIDATE HCL 10 MG PO TABS: 10.0000 mg | ORAL_TABLET | Freq: Three times a day (TID) | ORAL | 0 refills | Status: DC

## 2021-07-06 NOTE — Telephone Encounter (Signed)
Requesting: ritalin Contract: 06/15/2021 UDS:06/06/2021 Last Visit:06/06/2021 Next Visit:n/a Last Refill:06/06/2021  Please Advise

## 2021-07-08 ENCOUNTER — Other Ambulatory Visit: Payer: Self-pay | Admitting: Family Medicine

## 2021-07-12 ENCOUNTER — Encounter: Payer: Self-pay | Admitting: Family Medicine

## 2021-07-12 ENCOUNTER — Other Ambulatory Visit (INDEPENDENT_AMBULATORY_CARE_PROVIDER_SITE_OTHER): Payer: BLUE CROSS/BLUE SHIELD

## 2021-07-12 ENCOUNTER — Other Ambulatory Visit: Payer: Self-pay

## 2021-07-12 DIAGNOSIS — Z1159 Encounter for screening for other viral diseases: Secondary | ICD-10-CM | POA: Diagnosis not present

## 2021-07-12 DIAGNOSIS — E039 Hypothyroidism, unspecified: Secondary | ICD-10-CM | POA: Diagnosis not present

## 2021-07-13 LAB — HEPATITIS C ANTIBODY
Hepatitis C Ab: NONREACTIVE
SIGNAL TO CUT-OFF: <0.02 (ref <1.00)

## 2021-07-13 LAB — THYROID PANEL WITH TSH
Free Thyroxine Index: 4 — ABNORMAL HIGH (ref 1.4–3.8)
T3 Uptake: 30 % (ref 22–35)
T4, Total: 13.3 ug/dL — ABNORMAL HIGH (ref 5.1–11.9)
TSH: 2.37 mIU/L

## 2021-07-13 MED ORDER — LEVOTHYROXINE SODIUM 150 MCG PO TABS: 150.0000 ug | ORAL_TABLET | Freq: Every day | ORAL | 2 refills | Status: DC

## 2021-07-19 DIAGNOSIS — Z01419 Encounter for gynecological examination (general) (routine) without abnormal findings: Secondary | ICD-10-CM | POA: Diagnosis not present

## 2021-07-19 DIAGNOSIS — Z1231 Encounter for screening mammogram for malignant neoplasm of breast: Secondary | ICD-10-CM | POA: Diagnosis not present

## 2021-07-19 DIAGNOSIS — R635 Abnormal weight gain: Secondary | ICD-10-CM | POA: Diagnosis not present

## 2021-07-19 DIAGNOSIS — D649 Anemia, unspecified: Secondary | ICD-10-CM | POA: Diagnosis not present

## 2021-07-19 DIAGNOSIS — Z6829 Body mass index (BMI) 29.0-29.9, adult: Secondary | ICD-10-CM | POA: Diagnosis not present

## 2021-07-25 ENCOUNTER — Other Ambulatory Visit: Payer: Self-pay | Admitting: Obstetrics and Gynecology

## 2021-07-25 DIAGNOSIS — R928 Other abnormal and inconclusive findings on diagnostic imaging of breast: Secondary | ICD-10-CM

## 2021-08-07 ENCOUNTER — Other Ambulatory Visit: Payer: Self-pay | Admitting: Family Medicine

## 2021-08-07 DIAGNOSIS — F988 Other specified behavioral and emotional disorders with onset usually occurring in childhood and adolescence: Secondary | ICD-10-CM

## 2021-08-07 MED ORDER — METHYLPHENIDATE HCL 10 MG PO TABS: 10.0000 mg | ORAL_TABLET | Freq: Three times a day (TID) | ORAL | 0 refills | Status: DC

## 2021-08-07 NOTE — Telephone Encounter (Signed)
Requesting: Ritalin Contract: 06/06/21 UDS: 06/06/21 Last Visit: 06/06/21 Next Visit: none Last Refill: 07/06/21  Please Advise

## 2021-08-08 ENCOUNTER — Ambulatory Visit: Payer: BLUE CROSS/BLUE SHIELD

## 2021-08-08 ENCOUNTER — Ambulatory Visit
Admission: RE | Admit: 2021-08-08 | Discharge: 2021-08-08 | Disposition: A | Discharge status: Home / Self Care | Payer: BLUE CROSS/BLUE SHIELD | Source: Ambulatory Visit | Attending: Obstetrics and Gynecology | Admitting: Obstetrics and Gynecology

## 2021-08-08 DIAGNOSIS — R928 Other abnormal and inconclusive findings on diagnostic imaging of breast: Secondary | ICD-10-CM

## 2021-08-08 DIAGNOSIS — R922 Inconclusive mammogram: Secondary | ICD-10-CM | POA: Diagnosis not present

## 2021-08-18 DIAGNOSIS — D649 Anemia, unspecified: Secondary | ICD-10-CM | POA: Diagnosis not present

## 2021-09-06 ENCOUNTER — Other Ambulatory Visit: Payer: Self-pay | Admitting: Family Medicine

## 2021-09-06 DIAGNOSIS — F988 Other specified behavioral and emotional disorders with onset usually occurring in childhood and adolescence: Secondary | ICD-10-CM

## 2021-09-06 NOTE — Telephone Encounter (Signed)
Patient is requesting a refill of the following medications: Requested Prescriptions   Pending Prescriptions Disp Refills   methylphenidate (RITALIN) 10 MG tablet 90 tablet 0    Sig: Take 1 tablet (10 mg total) by mouth 3 (three) times daily with meals.    Date of patient request: 09/06/21 Last office visit: 06/06/21 Date of last refill: 08/07/21 Last refill amount: 90 + 0 Follow up time period per chart: none  USD: 06/06/21

## 2021-09-07 ENCOUNTER — Encounter: Payer: Self-pay | Admitting: Family Medicine

## 2021-09-07 DIAGNOSIS — E039 Hypothyroidism, unspecified: Secondary | ICD-10-CM

## 2021-09-07 MED ORDER — METHYLPHENIDATE HCL 10 MG PO TABS
10.0000 mg | ORAL_TABLET | Freq: Three times a day (TID) | ORAL | 0 refills | Status: DC
Start: 2021-09-07 — End: 2021-10-06

## 2021-09-20 ENCOUNTER — Other Ambulatory Visit (INDEPENDENT_AMBULATORY_CARE_PROVIDER_SITE_OTHER): Payer: BLUE CROSS/BLUE SHIELD

## 2021-09-20 DIAGNOSIS — E039 Hypothyroidism, unspecified: Secondary | ICD-10-CM | POA: Diagnosis not present

## 2021-09-20 LAB — THYROID PANEL WITH TSH
Free Thyroxine Index: 3.3 (ref 1.4–3.8)
T3 Uptake: 28 % (ref 22–35)
T4, Total: 11.7 ug/dL (ref 5.1–11.9)
TSH: 8.38 mIU/L — ABNORMAL HIGH

## 2021-09-21 ENCOUNTER — Encounter: Payer: Self-pay | Admitting: Family Medicine

## 2021-09-21 ENCOUNTER — Other Ambulatory Visit: Payer: Self-pay | Admitting: Family Medicine

## 2021-09-21 ENCOUNTER — Other Ambulatory Visit: Payer: BLUE CROSS/BLUE SHIELD

## 2021-09-21 DIAGNOSIS — G4700 Insomnia, unspecified: Secondary | ICD-10-CM

## 2021-09-22 ENCOUNTER — Other Ambulatory Visit: Payer: Self-pay

## 2021-09-22 MED ORDER — TRAZODONE HCL 100 MG PO TABS: 100.0000 mg | ORAL_TABLET | Freq: Every day | ORAL | 0 refills | Status: DC

## 2021-09-22 MED ORDER — LEVOTHYROXINE SODIUM 200 MCG PO TABS: 200.0000 ug | ORAL_TABLET | Freq: Every day | ORAL | 2 refills | Status: DC

## 2021-10-06 ENCOUNTER — Other Ambulatory Visit: Payer: Self-pay | Admitting: Family Medicine

## 2021-10-06 DIAGNOSIS — F988 Other specified behavioral and emotional disorders with onset usually occurring in childhood and adolescence: Secondary | ICD-10-CM

## 2021-10-06 MED ORDER — METHYLPHENIDATE HCL 10 MG PO TABS: 10.0000 mg | ORAL_TABLET | Freq: Three times a day (TID) | ORAL | 0 refills | Status: DC

## 2021-10-06 MED ORDER — LEVOTHYROXINE SODIUM 175 MCG PO TABS: 175.0000 ug | ORAL_TABLET | Freq: Every day | ORAL | 2 refills | Status: DC

## 2021-10-06 NOTE — Telephone Encounter (Signed)
Requesting: methylphenidate 10mg   Contract: 06/06/2021 UDS: 06/06/2021 Last Visit: 06/06/2021 Next Visit: None Last Refill: 09/07/2021 #90 and 0RF Pt sig: 1 tab tid   Please Advise

## 2021-10-06 NOTE — Addendum Note (Signed)
Addended by: Sanda Linger on: 10/06/2021 08:41 AM   Modules accepted: Orders

## 2021-10-16 ENCOUNTER — Ambulatory Visit (INDEPENDENT_AMBULATORY_CARE_PROVIDER_SITE_OTHER): Payer: BLUE CROSS/BLUE SHIELD | Admitting: Dermatology

## 2021-10-16 ENCOUNTER — Other Ambulatory Visit: Payer: Self-pay

## 2021-10-16 DIAGNOSIS — I781 Nevus, non-neoplastic: Secondary | ICD-10-CM | POA: Diagnosis not present

## 2021-10-16 DIAGNOSIS — L570 Actinic keratosis: Secondary | ICD-10-CM

## 2021-10-16 DIAGNOSIS — L918 Other hypertrophic disorders of the skin: Secondary | ICD-10-CM

## 2021-10-16 DIAGNOSIS — Z1283 Encounter for screening for malignant neoplasm of skin: Secondary | ICD-10-CM

## 2021-10-16 DIAGNOSIS — D1801 Hemangioma of skin and subcutaneous tissue: Secondary | ICD-10-CM

## 2021-10-16 DIAGNOSIS — L738 Other specified follicular disorders: Secondary | ICD-10-CM

## 2021-10-24 ENCOUNTER — Ambulatory Visit: Payer: BLUE CROSS/BLUE SHIELD | Admitting: Family Medicine

## 2021-10-24 ENCOUNTER — Encounter: Payer: Self-pay | Admitting: Family Medicine

## 2021-10-24 VITALS — BP 116/68 | HR 74 | Temp 97.9°F | Resp 18 | Ht 65.0 in | Wt 174.8 lb

## 2021-10-24 DIAGNOSIS — E039 Hypothyroidism, unspecified: Secondary | ICD-10-CM

## 2021-10-24 DIAGNOSIS — Z79899 Other long term (current) drug therapy: Secondary | ICD-10-CM

## 2021-10-24 DIAGNOSIS — D509 Iron deficiency anemia, unspecified: Secondary | ICD-10-CM

## 2021-10-24 DIAGNOSIS — J4520 Mild intermittent asthma, uncomplicated: Secondary | ICD-10-CM

## 2021-10-24 DIAGNOSIS — R232 Flushing: Secondary | ICD-10-CM

## 2021-10-24 DIAGNOSIS — F988 Other specified behavioral and emotional disorders with onset usually occurring in childhood and adolescence: Secondary | ICD-10-CM

## 2021-10-24 LAB — CBC WITH DIFFERENTIAL/PLATELET
Basophils Absolute: 0 10*3/uL (ref 0.0–0.1)
Basophils Relative: 0 % (ref 0.0–3.0)
Eosinophils Absolute: 0.5 10*3/uL (ref 0.0–0.7)
Eosinophils Relative: 8.4 % — ABNORMAL HIGH (ref 0.0–5.0)
HCT: 36.7 % (ref 36.0–46.0)
Hemoglobin: 12 g/dL (ref 12.0–15.0)
Lymphocytes Relative: 24.3 % (ref 12.0–46.0)
Lymphs Abs: 1.4 10*3/uL (ref 0.7–4.0)
MCHC: 32.7 g/dL (ref 30.0–36.0)
MCV: 88.8 fl (ref 78.0–100.0)
Monocytes Absolute: 0.4 10*3/uL (ref 0.1–1.0)
Monocytes Relative: 6 % (ref 3.0–12.0)
Neutro Abs: 3.6 10*3/uL (ref 1.4–7.7)
Neutrophils Relative %: 61.3 % (ref 43.0–77.0)
Platelets: 361 10*3/uL (ref 150.0–400.0)
RBC: 4.13 Mil/uL (ref 3.87–5.11)
RDW: 14.2 % (ref 11.5–15.5)
WBC: 5.9 10*3/uL (ref 4.0–10.5)

## 2021-10-24 LAB — COMPREHENSIVE METABOLIC PANEL
ALT: 16 U/L (ref 0–35)
AST: 18 U/L (ref 0–37)
Albumin: 4.2 g/dL (ref 3.5–5.2)
Alkaline Phosphatase: 41 U/L (ref 39–117)
BUN: 10 mg/dL (ref 6–23)
CO2: 33 mEq/L — ABNORMAL HIGH (ref 19–32)
Calcium: 9.6 mg/dL (ref 8.4–10.5)
Chloride: 102 mEq/L (ref 96–112)
Creatinine, Ser: 0.88 mg/dL (ref 0.40–1.20)
GFR: 74.28 mL/min (ref >60.00)
Glucose, Bld: 86 mg/dL (ref 70–99)
Potassium: 4.4 mEq/L (ref 3.5–5.1)
Sodium: 140 mEq/L (ref 135–145)
Total Bilirubin: 0.4 mg/dL (ref 0.2–1.2)
Total Protein: 6.8 g/dL (ref 6.0–8.3)

## 2021-10-24 LAB — IBC + FERRITIN
Ferritin: 16 ng/mL (ref 10.0–291.0)
Iron: 43 ug/dL (ref 42–145)
Saturation Ratios: 11.6 % — ABNORMAL LOW (ref 20.0–50.0)
TIBC: 369.6 ug/dL (ref 250.0–450.0)
Transferrin: 264 mg/dL (ref 212.0–360.0)

## 2021-10-24 LAB — FOLLICLE STIMULATING HORMONE: FSH: 104.4 m[IU]/mL

## 2021-10-24 LAB — TSH: TSH: 0.81 u[IU]/mL (ref 0.35–5.50)

## 2021-10-24 LAB — ESTRADIOL: Estradiol: <15 pg/mL

## 2021-10-24 LAB — LUTEINIZING HORMONE: LH: 49.68 m[IU]/mL

## 2021-10-24 NOTE — Assessment & Plan Note (Signed)
Check labs  Pt is on iron and b supplemets

## 2021-10-24 NOTE — Progress Notes (Signed)
Established Patient Office Visit  Subjective:  Patient ID: Shelby Warren, female    DOB: 1965-11-12  Age: 56 y.o. MRN: [id number]  CC:  Chief Complaint  Patient presents with   Hypothyroidism   ADD   Follow-up    HPI Shelby Warren presents for f/u thyroid and add.  She also c/o hot flashes and irritability that is worsening.   She is under the care of gyn but is curious if her symptoms are thyroid related or all menopause.    Past Medical History:  Diagnosis Date   ADD (attention deficit disorder)    Allergic rhinitis    Asthma    B12 deficiency    Hypothyroidism     Past Surgical History:  Procedure Laterality Date   ANAL FISSURE REPAIR     lasic eye surgery     spincture muscle     TOTAL ABDOMINAL HYSTERECTOMY  07/04/2020   dr Helane Rima    Family History  Problem Relation Age of Onset   Breast cancer Paternal Grandmother    Thyroid disease Paternal Grandmother    Diabetes Maternal Grandfather     Social History   Socioeconomic History   Marital status: Married    Spouse name: Not on file   Number of children: 2   Years of education: Not on file   Highest education level: Not on file  Occupational History    Employer: [employer]   Occupation: Homemaker  Tobacco Use   Smoking status: Never   Smokeless tobacco: Never  Vaping Use   Vaping Use: Never used  Substance and Sexual Activity   Alcohol use: Yes    Comment: occasional    Drug use: No   Sexual activity: Not on file  Other Topics Concern   Not on file  Social History Narrative   Not on file   Social Determinants of Health   Financial Resource Strain: Not on file  Food Insecurity: Not on file  Transportation Needs: Not on file  Physical Activity: Not on file  Stress: Not on file  Social Connections: Not on file  Intimate Partner Violence: Not on file    Outpatient Medications Prior to Visit  Medication Sig Dispense Refill   albuterol (VENTOLIN HFA) 108 (90 Base) MCG/ACT inhaler  Inhale 2 puffs into the lungs every 6 (six) hours as needed for wheezing or shortness of breath. 18 g 5   famotidine (PEPCID) 40 MG tablet TAKE 1 TABLET(40 MG) BY MOUTH TWICE DAILY 180 tablet 1   levothyroxine (SYNTHROID) 175 MCG tablet Take 1 tablet (175 mcg total) by mouth daily. 30 tablet 2   methylphenidate (RITALIN) 10 MG tablet Take 1 tablet (10 mg total) by mouth 3 (three) times daily with meals. 90 tablet 0   phentermine (ADIPEX-P) 37.5 MG tablet Take 37.5 mg by mouth daily.     traZODone (DESYREL) 100 MG tablet Take 1 tablet (100 mg total) by mouth at bedtime. 90 tablet 0   levothyroxine (SYNTHROID) 200 MCG tablet Take 1 tablet (200 mcg total) by mouth daily. 30 tablet 2   No facility-administered medications prior to visit.    Allergies  Allergen Reactions   Fish-Derived Products    Penicillin G Other (See Comments)   Penicillins    Strawberry Extract     ROS Review of Systems  Constitutional:  Negative for chills and fever.  HENT:  Negative for congestion and hearing loss.   Eyes:  Negative for discharge.  Respiratory:  Negative for cough and shortness of breath.   Cardiovascular:  Negative for chest pain, palpitations and leg swelling.  Gastrointestinal:  Negative for abdominal pain, blood in stool, constipation, diarrhea, nausea and vomiting.  Genitourinary:  Negative for dysuria, frequency, hematuria and urgency.  Musculoskeletal:  Negative for back pain and myalgias.  Skin:  Negative for rash.  Allergic/Immunologic: Negative for environmental allergies.  Neurological:  Negative for dizziness, weakness and headaches.  Hematological:  Does not bruise/bleed easily.  Psychiatric/Behavioral:  Negative for suicidal ideas. The patient is not nervous/anxious.      Objective:    Physical Exam Vitals and nursing note reviewed.  Constitutional:      Appearance: She is well-developed.  HENT:     Head: Normocephalic and atraumatic.  Eyes:     Conjunctiva/sclera:  Conjunctivae normal.  Neck:     Thyroid: No thyromegaly.     Vascular: No carotid bruit or JVD.  Cardiovascular:     Rate and Rhythm: Normal rate and regular rhythm.     Heart sounds: Normal heart sounds. No murmur heard. Pulmonary:     Effort: Pulmonary effort is normal. No respiratory distress.     Breath sounds: Normal breath sounds. No wheezing or rales.  Chest:     Chest wall: No tenderness.  Musculoskeletal:     Cervical back: Normal range of motion and neck supple.  Neurological:     Mental Status: She is alert and oriented to person, place, and time.    BP 116/68 (BP Location: Left Arm, Patient Position: Sitting, Cuff Size: Normal)    Pulse 74    Temp 97.9 F (36.6 C) (Oral)    Resp 18    Ht 5\' 5"  (1.651 m)    Wt 174 lb 12.8 oz (79.3 kg)    LMP 07/31/2018    SpO2 100%    BMI 29.09 kg/m  Wt Readings from Last 3 Encounters:  10/24/21 174 lb 12.8 oz (79.3 kg)  06/06/21 190 lb (86.2 kg)  11/29/20 183 lb (83 kg)     Health Maintenance Due  Topic Date Due   HIV Screening  Never done   Zoster Vaccines- Shingrix (1 of 2) Never done   MAMMOGRAM  04/01/2019   COVID-19 Vaccine (4 - Booster for Moderna series) 10/28/2020   PAP SMEAR-Modifier  03/31/2021   INFLUENZA VACCINE  05/01/2021    There are no preventive care reminders to display for this patient.  Lab Results  Component Value Date   TSH 8.38 (H) 09/20/2021   Lab Results  Component Value Date   WBC 6.0 04/13/2015   HGB 13.4 04/13/2015   HCT 39.0 04/13/2015   MCV 90.7 04/13/2015   PLT 269.0 04/13/2015   Lab Results  Component Value Date   NA 139 04/13/2015   K 4.5 04/13/2015   CO2 27 04/13/2015   GLUCOSE 87 04/13/2015   BUN 16 04/13/2015   CREATININE 0.99 04/13/2015   BILITOT 1.0 07/04/2012   ALKPHOS 26 (L) 07/04/2012   AST 17 07/04/2012   ALT 15 07/04/2012   PROT 7.4 07/04/2012   ALBUMIN 4.1 07/04/2012   CALCIUM 9.3 04/13/2015   GFR 63.54 04/13/2015   Lab Results  Component Value Date   CHOL  200 07/04/2012   Lab Results  Component Value Date   HDL 61.30 07/04/2012   Lab Results  Component Value Date   LDLCALC 123 (H) 07/04/2012   Lab Results  Component Value Date   TRIG 77.0 07/04/2012  Lab Results  Component Value Date   CHOLHDL 3 07/04/2012   No results found for: HGBA1C    Assessment & Plan:   Problem List Items Addressed This Visit       Unprioritized   Allergic asthma, mild intermittent, uncomplicated    Stable Only taking albuterol prn      Attention deficit disorder    Stable  con't meds Uds, contract updated  Database reviewed       Hot flashes    Check hormone levels Consider lexapro or effexor        Relevant Orders   Estradiol   [place]   LH   Hypothyroidism - Primary    Stable  con't synthroid Repeat labs toda      Relevant Orders   TSH   Iron deficiency anemia    Check labs  Pt is on iron and b supplemets      Relevant Orders   CBC with Differential/Platelet   Comprehensive metabolic panel   TSH   IBC + Ferritin   Other Visit Diagnoses     High risk medication use       Relevant Orders   Drug Monitoring Panel [id number], Urine       No orders of the defined types were placed in this encounter.   Follow-up: Return in about 6 months (around 04/23/2022) for annual exam, fasting.    Ann Held, DO

## 2021-10-24 NOTE — Assessment & Plan Note (Signed)
Stable  con't meds Uds, contract updated  Database reviewed

## 2021-10-24 NOTE — Assessment & Plan Note (Addendum)
Check hormone levels Consider lexapro or effexor

## 2021-10-24 NOTE — Assessment & Plan Note (Signed)
Stable  con't synthroid Repeat labs toda

## 2021-10-24 NOTE — Patient Instructions (Signed)
Living With Attention Deficit Hyperactivity Disorder If you have been diagnosed with attention deficit hyperactivity disorder (ADHD), you may be relieved that you now know why you have felt or behaved a certain way. Still, you may feel overwhelmed about the treatment ahead. You may also wonder how to get the support you need and how to deal with the condition day-to-day. With treatment and support, you can live with ADHD and manage your symptoms. How to manage lifestyle changes Managing stress Stress is your body's reaction to life changes and events, both good and bad. To cope with the stress of an ADHD diagnosis, it may help to: Learn more about ADHD. Exercise regularly. Even a short daily walk can lower stress levels. Participate in training or education programs (including social skills training classes) that teach you to deal with symptoms.  Medicines Your health care provider may suggest certain medicines if he or she feels that they will help to improve your condition. Stimulant medicines are usually prescribed to treat ADHD, and therapy may also be prescribed. It is important to: Avoid using alcohol and other substances that may prevent your medicines from working properly (may interact). Talk with your pharmacist or health care provider about all the medicines that you take, their possible side effects, and what medicines are safe to take together. Make it your goal to take part in all treatment decisions (shared decision-making). Ask about possible side effects of medicines that your health care provider recommends, and tell him or her how you feel about having those side effects. It is best if shared decision-making with your health care provider is part of your total treatment plan. Relationships To strengthen your relationships with family members while treating your condition, consider taking part in family therapy. You might also attend self-help groups alone or with a loved one. Be  honest about how your symptoms affect your relationships. Make an effort to communicate respectfully instead of fighting, and find ways to show others that you care. Psychotherapy may be useful in helping you cope with how ADHD affects your relationships. How to recognize changes in your condition The following signs may mean that your treatment is working well and your condition is improving: Consistently being on time for appointments. Being more organized at home and work. Other people noticing improvements in your behavior. Achieving goals that you set for yourself. Thinking more clearly. The following signs may mean that your treatment is not working very well: Feeling impatience or more confusion. Missing, forgetting, or being late for appointments. An increasing sense of disorganization and messiness. More difficulty in reaching goals that you set for yourself. Loved ones becoming angry or frustrated with you. Follow these instructions at home: Take over-the-counter and prescription medicines only as told by your health care provider. Check with your health care provider before taking any new medicines. Create structure and an organized atmosphere at home. For example: Make a list of tasks, then rank them from most important to least important. Work on one task at a time until your listed tasks are done. Make a daily schedule and follow it consistently every day. Use an appointment calendar, and check it 2 or 3 times a day to keep on track. Keep it with you when you leave the house. Create spaces where you keep certain things, and always put things back in their places after you use them. Keep all follow-up visits as told by your health care provider. This is important. Where to find support Talking to others    Keep emotion out of important discussions and speak in a calm, logical way. Listen closely and patiently to your loved ones. Try to understand their point of view, and try to  avoid getting defensive. Take responsibility for the consequences of your actions. Ask that others do not take your behaviors personally. Aim to solve problems as they come up, and express your feelings instead of bottling them up. Talk openly about what you need from your loved ones and how they can support you. Consider going to family therapy sessions or having your family meet with a specialist who deals with ADHD-related behavior problems. Finances Not all insurance plans cover mental health care, so it is important to check with your insurance carrier. If paying for co-pays or counseling services is a problem, search for a local or county mental health care center. Public mental health care services may be offered there at a low cost or no cost when you are not able to see a private health care provider. If you are taking medicine for ADHD, you may be able to get the generic form, which may be less expensive than brand-name medicine. Some makers of prescription medicines also offer help to patients who cannot afford the medicines that they need. Questions to ask your health care provider: What are the risks and benefits of taking medicines? Would I benefit from therapy? How often should I follow up with a health care provider? Contact a health care provider if: You have side effects from your medicines, such as: Repeated muscle twitches, coughing, or speech outbursts. Sleep problems. Loss of appetite. Depression. New or worsening behavior problems. Dizziness. Unusually fast heartbeat. Stomach pains. Headaches. Get help right away if: You have a severe reaction to a medicine. Your behavior suddenly gets worse. Summary With treatment and support, you can live with ADHD and manage your symptoms. The medicines that are most often prescribed for ADHD are stimulants. Consider taking part in family therapy or self-help groups with family members or friends. When you talk with friends  and family about your ADHD, be patient and communicate openly. Take over-the-counter and prescription medicines only as told by your health care provider. Check with your health care provider before taking any new medicines. This information is not intended to replace advice given to you by your health care provider. Make sure you discuss any questions you have with your health care provider. Document Revised: 03/02/2020 Document Reviewed: 03/02/2020 Elsevier Patient Education  2022 [employer]  

## 2021-10-24 NOTE — Assessment & Plan Note (Signed)
Stable Only taking albuterol prn

## 2021-10-26 ENCOUNTER — Encounter: Payer: Self-pay | Admitting: Family Medicine

## 2021-10-26 ENCOUNTER — Ambulatory Visit: Payer: BLUE CROSS/BLUE SHIELD | Admitting: Family Medicine

## 2021-10-27 LAB — DRUG MONITORING PANEL 375977 , URINE

## 2021-10-27 LAB — DM TEMPLATE

## 2021-11-02 ENCOUNTER — Other Ambulatory Visit: Payer: Self-pay | Admitting: Family Medicine

## 2021-11-02 DIAGNOSIS — F988 Other specified behavioral and emotional disorders with onset usually occurring in childhood and adolescence: Secondary | ICD-10-CM

## 2021-11-02 MED ORDER — METHYLPHENIDATE HCL 10 MG PO TABS: 10.0000 mg | ORAL_TABLET | Freq: Three times a day (TID) | ORAL | 0 refills | Status: DC

## 2021-11-02 NOTE — Telephone Encounter (Signed)
Requesting:ritalin Contract:10/24/21 UDS:10/24/21 Last Visit:10/24/21 Next Visit:na Last Refill:10/06/21  Please Advise

## 2021-11-05 ENCOUNTER — Encounter: Payer: Self-pay | Admitting: Dermatology

## 2021-11-05 NOTE — Progress Notes (Signed)
° °  New Patient   Subjective  Shelby Warren is a 56 y.o. female who presents for the following: New Patient (Initial Visit) (Pt here for an annual exam. Pt has spot of concern on the LEFT TEMPLE. ), Sinus Problem, and Anxiety.  General skin examination, lesion on left temple Location:  Duration:  Quality:  Associated Signs/Symptoms: Modifying Factors:  Severity:  Timing: Context:    The following portions of the chart were reviewed this encounter and updated as appropriate:  Tobacco   Allergies   Meds   Problems   Med Hx   Surg Hx   Fam Hx       Objective  Well appearing patient in no apparent distress; mood and affect are within normal limits. Scalp Full body exam: No atypical pigmented lesions or nonmelanoma skin cancer   Abdomen (Lower Torso, Anterior), Mid Back Multiple smooth red 1 mm dermal papules  Mid Forehead 78mm delled flesh-colored papule, typical dermoscopy  Left Buccal Cheek, Right Buccal Cheek Scattered linear telangiectasia secondary to minor UV damage  Neck - Anterior 2 mm flesh-colored pedunculated papule  Left Temple Gritty 5 mm pink crust    A full examination was performed including scalp, head, eyes, ears, nose, lips, neck, chest, axillae, abdomen, back, buttocks, bilateral upper extremities, bilateral lower extremities, hands, feet, fingers, toes, fingernails, and toenails. All findings within normal limits unless otherwise noted below.  Areas beneath undergarments not fully examined   Assessment & Plan  Screening exam for skin cancer Scalp  Yrly body exam, encouraged to self examine with spouse twice annually.  Continue ultraviolet protection.  Cherry angioma (2) Abdomen (Lower Torso, Anterior); Mid Back  No intervention necessary  Sebaceous hyperplasia of face Mid Forehead  Told of similar appearance of early BCC so if there is growth or bleeding return for biopsy  Telangiectasia (2) Left Buccal Cheek; Right Buccal  Cheek  Discussed treatment options, no intervention scheduled  Skin tag Neck - Anterior  May choose future removal  Solar keratosis Left Temple  If not smooth in 1 month after freezing return for possible biopsy  Destruction of lesion - Left Temple Complexity: simple   Destruction method: cryotherapy   Informed consent: discussed and consent obtained   Timeout:  patient name, date of birth, surgical site, and procedure verified Lesion destroyed using liquid nitrogen: Yes   Cryotherapy cycles:  3 Outcome: patient tolerated procedure well with no complications   Post-procedure details: wound care instructions given

## 2021-11-20 ENCOUNTER — Encounter: Payer: Self-pay | Admitting: Family Medicine

## 2021-11-20 ENCOUNTER — Telehealth: Payer: Self-pay

## 2021-11-20 ENCOUNTER — Telehealth (INDEPENDENT_AMBULATORY_CARE_PROVIDER_SITE_OTHER): Payer: BLUE CROSS/BLUE SHIELD | Admitting: Family Medicine

## 2021-11-20 DIAGNOSIS — J4520 Mild intermittent asthma, uncomplicated: Secondary | ICD-10-CM

## 2021-11-20 DIAGNOSIS — U071 COVID-19: Secondary | ICD-10-CM | POA: Diagnosis not present

## 2021-11-20 MED ORDER — PREDNISONE 20 MG PO TABS: 40.0000 mg | ORAL_TABLET | Freq: Every day | ORAL | 0 refills | Status: AC

## 2021-11-20 MED ORDER — MOLNUPIRAVIR EUA 200MG CAPSULE: 4.0000 | ORAL_CAPSULE | Freq: Two times a day (BID) | ORAL | 0 refills | Status: AC

## 2021-11-20 MED ORDER — ALBUTEROL SULFATE HFA 108 (90 BASE) MCG/ACT IN AERS: 2.0000 | INHALATION_SPRAY | Freq: Four times a day (QID) | RESPIRATORY_TRACT | 5 refills | Status: DC | PRN

## 2021-11-20 MED ORDER — NIRMATRELVIR/RITONAVIR (PAXLOVID)TABLET: ORAL_TABLET | ORAL | 0 refills | Status: DC

## 2021-11-20 MED ORDER — HYDROCOD POLI-CHLORPHE POLI ER 10-8 MG/5ML PO SUER: 5.0000 mL | Freq: Every evening | ORAL | 0 refills | Status: AC | PRN

## 2021-11-20 NOTE — Telephone Encounter (Signed)
Nurse Assessment Nurse: Shelby Addison, RN, [place] Date/Time (Eastern Time): 11/19/2021 9:27:31 AM Confirm and document reason for call. If symptomatic, describe symptoms. ---Caller states she has the flu and was wanting a virtual visit. Her fever is 102.8 currently and she has had a temp since yesterday afternoon. Caller states she started symptoms yesterday about noon. States her daughter and husband had the flu so she feels she has the flu as well. States her covid test was negative. Having to use inhaler yesterday throughout the day. States she started with what she thought was a chest cold. Does the patient have any new or worsening symptoms? ---Yes Will a triage be completed? ---Yes Related visit to physician within the last 2 weeks? ---No Does the PT have any chronic conditions? (i.e. diabetes, asthma, this includes High risk factors for pregnancy, etc.) ---Yes List chronic conditions. ---Asthma Is the patient pregnant or possibly pregnant? (Ask all females between the ages of 59-55) ---No Is this a behavioral health or substance abuse call? ---No PLEASE NOTE: All timestamps contained within this report are represented as [place] Standard Time. CONFIDENTIALTY NOTICE: This fax transmission is intended only for the addressee. It contains information that is legally privileged, confidential or otherwise protected from use or disclosure. If you are not the intended recipient, you are strictly prohibited from reviewing, disclosing, copying using or disseminating any of this information or taking any action in reliance on or regarding this information. If you have received this fax in error, please notify us immediately by telephone so that we can arrange for its return to [place]. Phone: [phone], Toll-Free: [phone], Fax: [phone] Page: 2 of 2 Call Id: [id number] Guidelines Guideline Title Affirmed Question Affirmed Notes Nurse Date/Time Eilene Ghazi Time) Influenza - Seasonal Patient  is HIGH RISK (e.g., age > 43 years, pregnant, HIV +, or chronic medical condition) Parrott, RN, [place] 11/19/2021 9:29:35 AM Disp. Time Eilene Ghazi Time) Disposition Final User 11/19/2021 9:34:26 AM Call PCP within 24 Hours Yes Parrott, RN, Interior and spatial designer Understands Yes PreDisposition Call Doctor Care Advice Given Per Guideline CALL PCP WITHIN 24 HOURS: * You need to discuss this with your doctor (or NP/PA) within the next 24 hours. * IF OFFICE WILL BE OPEN: Call the office when it opens tomorrow morning. CARE ADVICE given per Influenza - Seasonal (Adult) guideline. * You become worse CALL BACK IF

## 2021-11-20 NOTE — Telephone Encounter (Signed)
Pt has appt with Harlin Heys today

## 2021-11-20 NOTE — Progress Notes (Signed)
Virtual Video Visit via MyChart Note  I connected with  Lindon Romp on 11/20/21 at  2:40 PM EST by the video enabled telemedicine application for MyChart, and verified that I am speaking with the correct person using two identifiers.   I introduced myself as a Designer, jewellery with the practice. We discussed the limitations of evaluation and management by telemedicine and the availability of in person appointments. The patient expressed understanding and agreed to proceed.  Participating parties in this visit include: The patient and the nurse practitioner listed.  The patient is: At home I am: In the office - [facility]  Subjective:    CC: COVID+   HPI: Shelby Warren is a 56 y.o. year old female presenting today via [place] today for COVID+.  2 days ago patient started feeling poorly but had a negative home COVID test.  Her daughter and husband had the flu the week prior so she thought she may have the flu.  However on Sunday symptoms worsen significantly and she took another COVID test which was positive.  She has been having fevers up to 102.8 F, nasal congestion, sore throat, excessive coughing, headaches, body aches, chest soreness from coughing, trouble sleeping due to coughing, asthma exacerbation.  She denies any chest pain, nausea, vomiting, diarrhea, loss of taste/smell, dyspnea at rest.  She has been taking ibuprofen and Tylenol with some improvement.     Past medical history, Surgical history, Family history not pertinant except as noted below, Social history, Allergies, and medications have been entered into the medical record, reviewed, and corrections made.   Review of Systems:  All review of systems negative except what is listed in the HPI   Objective:    General:  Speaking clearly in complete sentences. Absent shortness of breath noted.   Alert and oriented x3.   Normal judgment.  Absent acute distress.   Impression and  Recommendations:    1. COVID-19 2. Mild intermittent asthma, unspecified whether complicated Given her asthma, adding Paxlovid and prednisone.  Refilling her albuterol inhaler.  Adding some Tussionex for bedtime cough. Continue supportive measures including rest, hydration, humidifier use, steam showers, warm compresses to sinuses, warm liquids with lemon and honey, and over-the-counter cough, cold, and analgesics as needed.  Education added to AVS. Patient aware of signs/symptoms requiring further/urgent evaluation.   - nirmatrelvir/ritonavir EUA (PAXLOVID) 20 x 150 MG & 10 x 100MG  TABS; 1 dose p.o. twice daily for 5 days, may use any available formulation at pharmacy with the same total dosage.  Dispense: 30 tablet; Refill: 0 - predniSONE (DELTASONE) 20 MG tablet; Take 2 tablets (40 mg total) by mouth daily with breakfast for 5 days.  Dispense: 10 tablet; Refill: 0 - chlorpheniramine-HYDROcodone 10-8 MG/5ML; Take 5 mLs by mouth at bedtime as needed for up to 5 days for cough (cough, will cause drowsiness.).  Dispense: 25 mL; Refill: 0 - albuterol (VENTOLIN HFA) 108 (90 Base) MCG/ACT inhaler; Inhale 2 puffs into the lungs every 6 (six) hours as needed for wheezing or shortness of breath.  Dispense: 18 g; Refill: 5     Follow-up if symptoms worsen or fail to improve.    I discussed the assessment and treatment plan with the patient. The patient was provided an opportunity to ask questions and all were answered. The patient agreed with the plan and demonstrated an understanding of the instructions.   The patient was advised to call back or seek an in-person evaluation  if the symptoms worsen or if the condition fails to improve as anticipated.  I spent 20 minutes dedicated to the care of this patient on the date of this encounter to include pre-visit chart review of prior notes and results, face-to-face time with the patient, and post-visit ordering of testing as indicated.   Terrilyn Saver, NP

## 2021-11-20 NOTE — Patient Instructions (Signed)
Paxlovid patient fact sheet: HotterNames.de   Over the counter medications that may be helpful for symptoms:  Guaifenesin 1200 mg extended release tabs twice daily, with plenty of water For cough and congestion Brand name: Mucinex   Pseudoephedrine 30 mg, one or two tabs every 4 to 6 hours For sinus congestion Brand name: Sudafed You must get this from the pharmacy counter.  Oxymetazoline nasal spray each morning, one spray in each nostril, for NO MORE THAN 3 days  For nasal and sinus congestion Brand name: Afrin Saline nasal spray or Saline Nasal Irrigation 3-5 times a day For nasal and sinus congestion Brand names: Ocean or AYR Fluticasone nasal spray, one spray in each nostril, each morning (after oxymetazoline and saline, if used) For nasal and sinus congestion Brand name: Flonase Warm salt water gargles  For sore throat Every few hours as needed Alternate ibuprofen 400-600 mg and acetaminophen 1000 mg every 4-6 hours For fever, body aches, headache Brand names: Motrin or Advil and Tylenol Dextromethorphan 12-hour cough version 30 mg every 12 hours  For cough Brand name: Delsym Stop all other cold medications for now (Nyquil, Dayquil, Tylenol Cold, Theraflu, etc) and other non-prescription cough/cold preparations. Many of these have the same ingredients listed above and could cause an overdose of medication.   Herbal treatments that have been shown to be helpful in some patients include: Vitamin C 1000mg  per day Vitamin D 4000iU per day Zinc 100mg  per day Quercetin 25-500mg  twice a day Melatonin 5-10mg  at bedtime  General Instructions Allow your body to rest Drink PLENTY of fluids Isolate yourself from everyone, even family, until test results have returned  If your COVID-19 test is positive Then you ARE INFECTED and you can pass the virus to others You must quarantine from others for a minimum of  5 days since symptoms started AND You  are fever free for 24 hours WITHOUT any medication to reduce fever AND Your symptoms are improving Wear mask for the next 5 days If not improved by day 5, quarantine for the full 10 days. Do not go to the store or other public areas Do not go around household members who are not known to be infected with COVID-19 If you MUST leave your area of quarantine (example: go to a bathroom you share with others in your home), you must Wear a mask Wash your hands thoroughly Wipe down any surfaces you touch Do not share food, drinks, towels, or other items with other persons Dispose of your own tissues, food containers, etc  Once you have recovered, please continue good preventive care measures, including:  wearing a mask when in public wash your hands frequently avoid touching your face/nose/eyes cover coughs/sneezes with the inside of your elbow stay out of crowds keep a 6 foot distance from others  If you develop severe shortness of breath, uncontrolled fevers, coughing up blood, confusion, chest pain, or signs of dehydration (such as significantly decreased urine amounts or dizziness with standing) please go to the ER.

## 2021-11-23 ENCOUNTER — Encounter: Payer: Self-pay | Admitting: Family Medicine

## 2021-11-23 ENCOUNTER — Other Ambulatory Visit: Payer: Self-pay | Admitting: Family Medicine

## 2021-11-23 DIAGNOSIS — U071 COVID-19: Secondary | ICD-10-CM

## 2021-11-23 MED ORDER — BENZONATATE 200 MG PO CAPS: 200.0000 mg | ORAL_CAPSULE | Freq: Two times a day (BID) | ORAL | 0 refills | Status: DC | PRN

## 2021-11-27 ENCOUNTER — Other Ambulatory Visit: Payer: Self-pay | Admitting: Family Medicine

## 2021-11-27 DIAGNOSIS — J329 Chronic sinusitis, unspecified: Secondary | ICD-10-CM

## 2021-11-27 MED ORDER — AZITHROMYCIN 250 MG PO TABS: ORAL_TABLET | ORAL | 0 refills | Status: DC

## 2021-11-29 ENCOUNTER — Other Ambulatory Visit: Payer: Self-pay | Admitting: Family Medicine

## 2021-11-29 DIAGNOSIS — R110 Nausea: Secondary | ICD-10-CM

## 2021-11-29 MED ORDER — ONDANSETRON 8 MG PO TBDP: 8.0000 mg | ORAL_TABLET | Freq: Three times a day (TID) | ORAL | 3 refills | Status: DC | PRN

## 2021-12-04 ENCOUNTER — Other Ambulatory Visit: Payer: Self-pay

## 2021-12-04 ENCOUNTER — Ambulatory Visit (INDEPENDENT_AMBULATORY_CARE_PROVIDER_SITE_OTHER): Payer: BLUE CROSS/BLUE SHIELD | Admitting: Family Medicine

## 2021-12-04 ENCOUNTER — Other Ambulatory Visit: Payer: Self-pay | Admitting: Family Medicine

## 2021-12-04 ENCOUNTER — Ambulatory Visit (HOSPITAL_BASED_OUTPATIENT_CLINIC_OR_DEPARTMENT_OTHER)
Admission: RE | Admit: 2021-12-04 | Discharge: 2021-12-04 | Disposition: A | Discharge status: Home / Self Care | Payer: BLUE CROSS/BLUE SHIELD | Source: Ambulatory Visit | Attending: Family Medicine | Admitting: Family Medicine

## 2021-12-04 ENCOUNTER — Encounter: Payer: Self-pay | Admitting: Family Medicine

## 2021-12-04 VITALS — BP 130/84 | HR 91 | Temp 97.7°F | Resp 18 | Ht 65.0 in | Wt 175.0 lb

## 2021-12-04 DIAGNOSIS — J4541 Moderate persistent asthma with (acute) exacerbation: Secondary | ICD-10-CM | POA: Diagnosis not present

## 2021-12-04 DIAGNOSIS — F988 Other specified behavioral and emotional disorders with onset usually occurring in childhood and adolescence: Secondary | ICD-10-CM

## 2021-12-04 DIAGNOSIS — E039 Hypothyroidism, unspecified: Secondary | ICD-10-CM | POA: Diagnosis not present

## 2021-12-04 DIAGNOSIS — M47814 Spondylosis without myelopathy or radiculopathy, thoracic region: Secondary | ICD-10-CM | POA: Diagnosis not present

## 2021-12-04 DIAGNOSIS — R051 Acute cough: Secondary | ICD-10-CM | POA: Diagnosis not present

## 2021-12-04 DIAGNOSIS — J40 Bronchitis, not specified as acute or chronic: Secondary | ICD-10-CM | POA: Diagnosis not present

## 2021-12-04 DIAGNOSIS — R059 Cough, unspecified: Secondary | ICD-10-CM | POA: Diagnosis not present

## 2021-12-04 DIAGNOSIS — M4184 Other forms of scoliosis, thoracic region: Secondary | ICD-10-CM | POA: Diagnosis not present

## 2021-12-04 MED ORDER — LEVOFLOXACIN 500 MG PO TABS: 500.0000 mg | ORAL_TABLET | Freq: Every day | ORAL | 0 refills | Status: AC

## 2021-12-04 MED ORDER — PROMETHAZINE-DM 6.25-15 MG/5ML PO SYRP: 5.0000 mL | ORAL_SOLUTION | Freq: Four times a day (QID) | ORAL | 0 refills | Status: DC | PRN

## 2021-12-04 MED ORDER — METHYLPHENIDATE HCL 10 MG PO TABS: 10.0000 mg | ORAL_TABLET | Freq: Three times a day (TID) | ORAL | 0 refills | Status: DC

## 2021-12-04 MED ORDER — TRELEGY ELLIPTA 100-62.5-25 MCG/ACT IN AEPB: INHALATION_SPRAY | RESPIRATORY_TRACT | 11 refills | Status: DC

## 2021-12-04 NOTE — Assessment & Plan Note (Signed)
Stable con't meds 

## 2021-12-04 NOTE — Progress Notes (Signed)
Subjective:   By signing my name below, I, Carylon Perches, attest that this documentation has been prepared under the direction and in the presence of Roma Schanz DO, 12/04/2021    Patient ID: Shelby Warren, female    DOB: [birth year], 56 y.o.   MRN: [id number]  Chief Complaint  Patient presents with   Post COVID Sxs    Pt states having non productive cough and fatigue and using Mucinex.    HPI Patient is in today for an office visit.  Patient complains of on going COVID - 19 symptoms. She states that she is on her 9th - 10th bottle of Mucinex. She does not feel like Mucinex is helping her symptoms. She is using her inhaler frequently. She is also taking Zithromax to help with symptoms. She reported feeling better over the weekend but woke up on 12/04/2021 feeling worse. She is recommended to use her prescribed antibiotics and her inhaler.   She is requesting a refill of 10 MG of Ritalin.   Past Medical History:  Diagnosis Date   ADD (attention deficit disorder)    Allergic rhinitis    Asthma    B12 deficiency    Hypothyroidism     Past Surgical History:  Procedure Laterality Date   ANAL FISSURE REPAIR     lasic eye surgery     spincture muscle     TOTAL ABDOMINAL HYSTERECTOMY  07/04/2020   dr Helane Rima    Family History  Problem Relation Age of Onset   Breast cancer Paternal Grandmother    Thyroid disease Paternal Grandmother    Diabetes Maternal Grandfather     Social History   Socioeconomic History   Marital status: Married    Spouse name: Not on file   Number of children: 2   Years of education: Not on file   Highest education level: Not on file  Occupational History    Employer: [employer]   Occupation: Homemaker  Tobacco Use   Smoking status: Never   Smokeless tobacco: Never  Vaping Use   Vaping Use: Never used  Substance and Sexual Activity   Alcohol use: Yes    Comment: occasional    Drug use: No   Sexual activity: Not on file  Other  Topics Concern   Not on file  Social History Narrative   Not on file   Social Determinants of Health   Financial Resource Strain: Not on file  Food Insecurity: Not on file  Transportation Needs: Not on file  Physical Activity: Not on file  Stress: Not on file  Social Connections: Not on file  Intimate Partner Violence: Not on file    Outpatient Medications Prior to Visit  Medication Sig Dispense Refill   albuterol (VENTOLIN HFA) 108 (90 Base) MCG/ACT inhaler Inhale 2 puffs into the lungs every 6 (six) hours as needed for wheezing or shortness of breath. 18 g 5   famotidine (PEPCID) 40 MG tablet TAKE 1 TABLET(40 MG) BY MOUTH TWICE DAILY 180 tablet 1   levothyroxine (SYNTHROID) 175 MCG tablet Take 1 tablet (175 mcg total) by mouth daily. 30 tablet 2   ondansetron (ZOFRAN-ODT) 8 MG disintegrating tablet Take 1 tablet (8 mg total) by mouth every 8 (eight) hours as needed for nausea. 20 tablet 3   phentermine (ADIPEX-P) 37.5 MG tablet Take 37.5 mg by mouth daily.     traZODone (DESYREL) 100 MG tablet Take 1 tablet (100 mg total) by mouth at bedtime. 90 tablet 0  methylphenidate (RITALIN) 10 MG tablet Take 1 tablet (10 mg total) by mouth 3 (three) times daily with meals. 90 tablet 0   azithromycin (ZITHROMAX Z-PAK) 250 MG tablet Take 2 tablets (500 mg) on  Day 1,  followed by 1 tablet (250 mg) once daily on Days 2 through 5. (Patient not taking: Reported on 12/04/2021) 6 tablet 0   benzonatate (TESSALON) 200 MG capsule Take 1 capsule (200 mg total) by mouth 2 (two) times daily as needed for cough. (Patient not taking: Reported on 12/04/2021) 20 capsule 0   No facility-administered medications prior to visit.    Allergies  Allergen Reactions   Fish-Derived Products    Penicillin G Other (See Comments)   Penicillins    Strawberry Extract     Review of Systems  Constitutional:  Negative for fever and malaise/fatigue.  HENT:  Positive for congestion.   Eyes:  Negative for blurred vision.   Respiratory:  Positive for cough. Negative for shortness of breath.   Cardiovascular:  Negative for chest pain, palpitations and leg swelling.  Gastrointestinal:  Negative for abdominal pain, blood in stool and nausea.  Genitourinary:  Negative for dysuria and frequency.  Musculoskeletal:  Negative for falls.  Skin:  Negative for rash.  Neurological:  Negative for dizziness, loss of consciousness and headaches.  Endo/Heme/Allergies:  Negative for environmental allergies.  Psychiatric/Behavioral:  Negative for depression. The patient is not nervous/anxious.       Objective:    Physical Exam Vitals and nursing note reviewed.  Constitutional:      General: She is not in acute distress.    Appearance: Normal appearance. She is not ill-appearing.  HENT:     Head: Normocephalic and atraumatic.     Right Ear: External ear normal.     Left Ear: External ear normal.  Eyes:     Extraocular Movements: Extraocular movements intact.     Pupils: Pupils are equal, round, and reactive to light.  Cardiovascular:     Rate and Rhythm: Normal rate and regular rhythm.     Heart sounds: Normal heart sounds. No murmur heard.   No gallop.  Pulmonary:     Effort: Pulmonary effort is normal. No respiratory distress.     Breath sounds: Normal breath sounds. No wheezing or rales.  Skin:    General: Skin is warm and dry.  Neurological:     Mental Status: She is alert and oriented to person, place, and time.  Psychiatric:        Judgment: Judgment normal.    BP 130/84 (BP Location: Right Arm, Patient Position: Sitting, Cuff Size: Normal)    Pulse 91    Temp 97.7 F (36.5 C) (Oral)    Resp 18    Ht '5\' 5"'$  (1.651 m)    Wt 175 lb (79.4 kg)    LMP 07/31/2018    SpO2 99%    BMI 29.12 kg/m  Wt Readings from Last 3 Encounters:  12/04/21 175 lb (79.4 kg)  10/24/21 174 lb 12.8 oz (79.3 kg)  06/06/21 190 lb (86.2 kg)    Diabetic Foot Exam - Simple   No data filed    Lab Results  Component Value Date    WBC 5.9 10/24/2021   HGB 12.0 10/24/2021   HCT 36.7 10/24/2021   PLT 361.0 10/24/2021   GLUCOSE 86 10/24/2021   CHOL 200 07/04/2012   TRIG 77.0 07/04/2012   HDL 61.30 07/04/2012   LDLCALC 123 (H) 07/04/2012   ALT 16  10/24/2021   AST 18 10/24/2021   NA 140 10/24/2021   K 4.4 10/24/2021   CL 102 10/24/2021   CREATININE 0.88 10/24/2021   BUN 10 10/24/2021   CO2 33 (H) 10/24/2021   TSH 0.81 10/24/2021    Lab Results  Component Value Date   TSH 0.81 10/24/2021   Lab Results  Component Value Date   WBC 5.9 10/24/2021   HGB 12.0 10/24/2021   HCT 36.7 10/24/2021   MCV 88.8 10/24/2021   PLT 361.0 10/24/2021   Lab Results  Component Value Date   NA 140 10/24/2021   K 4.4 10/24/2021   CO2 33 (H) 10/24/2021   GLUCOSE 86 10/24/2021   BUN 10 10/24/2021   CREATININE 0.88 10/24/2021   BILITOT 0.4 10/24/2021   ALKPHOS 41 10/24/2021   AST 18 10/24/2021   ALT 16 10/24/2021   PROT 6.8 10/24/2021   ALBUMIN 4.2 10/24/2021   CALCIUM 9.6 10/24/2021   GFR 74.28 10/24/2021   Lab Results  Component Value Date   CHOL 200 07/04/2012   Lab Results  Component Value Date   HDL 61.30 07/04/2012   Lab Results  Component Value Date   LDLCALC 123 (H) 07/04/2012   Lab Results  Component Value Date   TRIG 77.0 07/04/2012   Lab Results  Component Value Date   CHOLHDL 3 07/04/2012   No results found for: HGBA1C     Assessment & Plan:   Problem List Items Addressed This Visit       Unprioritized   Attention deficit disorder    Stable  con't meds        Relevant Medications   methylphenidate (RITALIN) 10 MG tablet   Hypothyroidism    con't synthroid Check thyroid      Relevant Orders   Thyroid Panel With TSH   Other Visit Diagnoses     Acute cough    -  Primary   Relevant Medications   promethazine-dextromethorphan (PROMETHAZINE-DM) 6.25-15 MG/5ML syrup   Other Relevant Orders   DG Chest 2 View   Bronchitis       Relevant Medications   levofloxacin  (LEVAQUIN) 500 MG tablet   promethazine-dextromethorphan (PROMETHAZINE-DM) 6.25-15 MG/5ML syrup   Moderate persistent asthma with acute exacerbation       Relevant Medications   Fluticasone-Umeclidin-Vilant (TRELEGY ELLIPTA) 100-62.5-25 MCG/ACT AEPB         Meds ordered this encounter  Medications   levofloxacin (LEVAQUIN) 500 MG tablet    Sig: Take 1 tablet (500 mg total) by mouth daily for 7 days.    Dispense:  7 tablet    Refill:  0   Fluticasone-Umeclidin-Vilant (TRELEGY ELLIPTA) 100-62.5-25 MCG/ACT AEPB    Sig: 1 inh qd    Dispense:  1 each    Refill:  11   promethazine-dextromethorphan (PROMETHAZINE-DM) 6.25-15 MG/5ML syrup    Sig: Take 5 mLs by mouth 4 (four) times daily as needed.    Dispense:  118 mL    Refill:  0   methylphenidate (RITALIN) 10 MG tablet    Sig: Take 1 tablet (10 mg total) by mouth 3 (three) times daily with meals.    Dispense:  90 tablet    Refill:  0    I, Ann Held, DO, personally preformed the services described in this documentation.  All medical record entries made by the scribe were at my direction and in my presence.  I have reviewed the chart and discharge instructions (if applicable) and  agree that the record reflects my personal performance and is accurate and complete. 12/04/2021   I,Amber Collins,acting as a scribe for Ann Held, DO.,have documented all relevant documentation on the behalf of Ann Held, DO,as directed by  Ann Held, DO while in the presence of Ann Held, DO.    Ann Held, DO

## 2021-12-04 NOTE — Assessment & Plan Note (Signed)
con't synthroid ?Check thyroid ?

## 2021-12-04 NOTE — Patient Instructions (Signed)
Acute Bronchitis, Adult °Acute bronchitis is sudden inflammation of the main airways (bronchi) that come off the windpipe (trachea) in the lungs. The swelling causes the airways to get smaller and make more mucus than normal. This can make it hard to breathe and can cause coughing or noisy breathing (wheezing). °Acute bronchitis may last several weeks. The cough may last longer. Allergies, asthma, and exposure to smoke may make the condition worse. °What are the causes? °This condition can be caused by germs and by substances that irritate the lungs, including: °Cold and flu viruses. The most common cause of this condition is the virus that causes the common cold. °Bacteria. This is less common. °Breathing in substances that irritate the lungs, including: °Smoke from cigarettes and other forms of tobacco. °Dust and pollen. °Fumes from household cleaning products, gases, or burned fuel. °Indoor or outdoor air pollution. °What increases the risk? °The following factors may make you more likely to develop this condition: °A weak body's defense system, also called the immune system. °A condition that affects your lungs and breathing, such as asthma. °What are the signs or symptoms? °Common symptoms of this condition include: °Coughing. This may bring up clear, yellow, or green mucus from your lungs (sputum). °Wheezing. °Runny or stuffy nose. °Having too much mucus in your lungs (chest congestion). °Shortness of breath. °Aches and pains, including sore throat or chest. °How is this diagnosed? °This condition is usually diagnosed based on: °Your symptoms and medical history. °A physical exam. °You may also have other tests, including tests to rule out other conditions, such as pneumonia. These tests include: °A test of lung function. °Test of a mucus sample to look for the presence of bacteria. °Tests to check the oxygen level in your blood. °Blood tests. °Chest X-ray. °How is this treated? °Most cases of acute bronchitis  clear up over time without treatment. Your health care provider may recommend: °Drinking more fluids to help thin your mucus so it is easier to cough up. °Taking inhaled medicine (inhaler) to improve air flow in and out of your lungs. °Using a vaporizer or a humidifier. These are machines that add water to the air to help you breathe better. °Taking a medicine that thins mucus and clears congestion (expectorant). °Taking a medicine that prevents or stops coughing (cough suppressant). °It is notcommon to take an antibiotic medicine for this condition. °Follow these instructions at home: ° °Take over-the-counter and prescription medicines only as told by your health care provider. °Use an inhaler, vaporizer, or humidifier as told by your health care provider. °Take two teaspoons (10 mL) of honey at bedtime to lessen coughing at night. °Drink enough fluid to keep your urine pale yellow. °Do not use any products that contain nicotine or tobacco. These products include cigarettes, chewing tobacco, and vaping devices, such as e-cigarettes. If you need help quitting, ask your health care provider. °Get plenty of rest. °Return to your normal activities as told by your health care provider. Ask your health care provider what activities are safe for you. °Keep all follow-up visits. This is important. °How is this prevented? °To lower your risk of getting this condition again: °Wash your hands often with soap and water for at least 20 seconds. If soap and water are not available, use hand sanitizer. °Avoid contact with people who have cold symptoms. °Try not to touch your mouth, nose, or eyes with your hands. °Avoid breathing in smoke or chemical fumes. Breathing smoke or chemical fumes will make your condition   worse. °Get the flu shot every year. °Contact a health care provider if: °Your symptoms do not improve after 2 weeks. °You have trouble coughing up the mucus. °Your cough keeps you awake at night. °You have a  fever. °Get help right away if you: °Cough up blood. °Feel pain in your chest. °Have severe shortness of breath. °Faint or keep feeling like you are going to faint. °Have a severe headache. °Have a fever or chills that get worse. °These symptoms may represent a serious problem that is an emergency. Do not wait to see if the symptoms will go away. Get medical help right away. Call your local emergency services (911 in the [place] Do not drive yourself to the hospital. °Summary °Acute bronchitis is inflammation of the main airways (bronchi) that come off the windpipe (trachea) in the lungs. The swelling causes the airways to get smaller and make more mucus than normal. °Drinking more fluids can help thin your mucus so it is easier to cough up. °Take over-the-counter and prescription medicines only as told by your health care provider. °Do not use any products that contain nicotine or tobacco. These products include cigarettes, chewing tobacco, and vaping devices, such as e-cigarettes. If you need help quitting, ask your health care provider. °Contact a health care provider if your symptoms do not improve after 2 weeks. °This information is not intended to replace advice given to you by your health care provider. Make sure you discuss any questions you have with your health care provider. °Document Revised: 01/18/2021 Document Reviewed: 01/18/2021 °Elsevier Patient Education © 2022 [employer]. ° °

## 2021-12-05 LAB — THYROID PANEL WITH TSH
Free Thyroxine Index: 3.5 (ref 1.4–3.8)
T3 Uptake: 29 % (ref 22–35)
T4, Total: 11.9 ug/dL (ref 5.1–11.9)
TSH: 1.01 mIU/L

## 2021-12-20 ENCOUNTER — Other Ambulatory Visit: Payer: Self-pay | Admitting: Family Medicine

## 2021-12-20 DIAGNOSIS — G4700 Insomnia, unspecified: Secondary | ICD-10-CM

## 2021-12-29 ENCOUNTER — Encounter: Payer: Self-pay | Admitting: Family Medicine

## 2021-12-29 ENCOUNTER — Other Ambulatory Visit: Payer: Self-pay | Admitting: Family Medicine

## 2021-12-29 MED ORDER — LEVOFLOXACIN 500 MG PO TABS: 500.0000 mg | ORAL_TABLET | Freq: Every day | ORAL | 0 refills | Status: DC

## 2021-12-31 ENCOUNTER — Other Ambulatory Visit: Payer: Self-pay | Admitting: Family Medicine

## 2022-01-02 MED ORDER — LEVOFLOXACIN 500 MG PO TABS: 500.0000 mg | ORAL_TABLET | Freq: Every day | ORAL | 0 refills | Status: AC

## 2022-01-05 ENCOUNTER — Other Ambulatory Visit: Payer: Self-pay | Admitting: Family Medicine

## 2022-01-07 ENCOUNTER — Other Ambulatory Visit: Payer: Self-pay | Admitting: Family Medicine

## 2022-01-07 DIAGNOSIS — F988 Other specified behavioral and emotional disorders with onset usually occurring in childhood and adolescence: Secondary | ICD-10-CM

## 2022-01-08 ENCOUNTER — Other Ambulatory Visit: Payer: Self-pay

## 2022-01-08 MED ORDER — METHYLPHENIDATE HCL 10 MG PO TABS: 10.0000 mg | ORAL_TABLET | Freq: Three times a day (TID) | ORAL | 0 refills | Status: DC

## 2022-01-08 MED ORDER — FAMOTIDINE 40 MG PO TABS: ORAL_TABLET | ORAL | 1 refills | Status: DC

## 2022-01-08 NOTE — Telephone Encounter (Signed)
Requesting: ritalin '10mg'$  ?Contract: 06/06/21 ?UDS: 10/24/21 ?Last Visit: 12/04/21 ?Next Visit: none ?Last Refill: 12/04/21 ? ?Please Advise ? ?

## 2022-01-16 ENCOUNTER — Encounter: Payer: Self-pay | Admitting: Family Medicine

## 2022-01-16 ENCOUNTER — Encounter: Payer: Self-pay | Admitting: Dermatology

## 2022-01-24 ENCOUNTER — Other Ambulatory Visit: Payer: Self-pay | Admitting: Family Medicine

## 2022-01-24 DIAGNOSIS — G4700 Insomnia, unspecified: Secondary | ICD-10-CM

## 2022-02-08 ENCOUNTER — Other Ambulatory Visit: Payer: Self-pay | Admitting: Family Medicine

## 2022-02-08 DIAGNOSIS — F988 Other specified behavioral and emotional disorders with onset usually occurring in childhood and adolescence: Secondary | ICD-10-CM

## 2022-02-08 MED ORDER — METHYLPHENIDATE HCL 10 MG PO TABS: 10.0000 mg | ORAL_TABLET | Freq: Three times a day (TID) | ORAL | 0 refills | Status: DC

## 2022-02-08 NOTE — Telephone Encounter (Signed)
Requesting: Ritalin '10mg'$   ?Contract: 10/24/21 ?UDS: 10/24/21 ?Last Visit: 12/04/21 ?Next Visit: None ?Last Refill: 01/08/22 #90 and 0RF ? ?Please Advise ? ?

## 2022-02-19 ENCOUNTER — Encounter: Payer: Self-pay | Admitting: Family Medicine

## 2022-02-19 DIAGNOSIS — M25569 Pain in unspecified knee: Secondary | ICD-10-CM

## 2022-02-19 NOTE — Telephone Encounter (Signed)
Would you like to see patient or refer back to ortho?

## 2022-02-27 DIAGNOSIS — S83241A Other tear of medial meniscus, current injury, right knee, initial encounter: Secondary | ICD-10-CM | POA: Diagnosis not present

## 2022-03-07 ENCOUNTER — Other Ambulatory Visit: Payer: Self-pay | Admitting: Family Medicine

## 2022-03-07 DIAGNOSIS — F988 Other specified behavioral and emotional disorders with onset usually occurring in childhood and adolescence: Secondary | ICD-10-CM

## 2022-03-07 MED ORDER — METHYLPHENIDATE HCL 10 MG PO TABS: 10.0000 mg | ORAL_TABLET | Freq: Three times a day (TID) | ORAL | 0 refills | Status: DC

## 2022-03-07 NOTE — Telephone Encounter (Signed)
Requesting: methylphenidate '10mg'$  Contract: 10/24/21 UDS: 10/24/21 Last Visit: 10/24/21 Next Visit: none Last Refill: 02/08/22  Please Advise

## 2022-03-19 ENCOUNTER — Ambulatory Visit (INDEPENDENT_AMBULATORY_CARE_PROVIDER_SITE_OTHER): Payer: BLUE CROSS/BLUE SHIELD | Admitting: Dermatology

## 2022-03-19 ENCOUNTER — Encounter: Payer: Self-pay | Admitting: Dermatology

## 2022-03-19 DIAGNOSIS — L820 Inflamed seborrheic keratosis: Secondary | ICD-10-CM

## 2022-03-26 ENCOUNTER — Other Ambulatory Visit: Payer: Self-pay | Admitting: Family Medicine

## 2022-03-28 ENCOUNTER — Ambulatory Visit: Payer: BLUE CROSS/BLUE SHIELD | Admitting: Dermatology

## 2022-04-08 ENCOUNTER — Other Ambulatory Visit: Payer: Self-pay | Admitting: Family Medicine

## 2022-04-08 DIAGNOSIS — F988 Other specified behavioral and emotional disorders with onset usually occurring in childhood and adolescence: Secondary | ICD-10-CM

## 2022-04-09 MED ORDER — METHYLPHENIDATE HCL 10 MG PO TABS: 10.0000 mg | ORAL_TABLET | Freq: Three times a day (TID) | ORAL | 0 refills | Status: DC

## 2022-04-09 NOTE — Telephone Encounter (Signed)
Requesting: methylphenidate '10mg'$   Contract: 10/24/21 UDS: 10/24/21 Last Visit: 12/04/21 Next Visit: None Last Refill: 03/07/22 #90 and 0RF  Please Advise

## 2022-04-10 ENCOUNTER — Other Ambulatory Visit: Payer: Self-pay | Admitting: Family Medicine

## 2022-04-10 ENCOUNTER — Encounter: Payer: Self-pay | Admitting: Family Medicine

## 2022-04-10 DIAGNOSIS — F988 Other specified behavioral and emotional disorders with onset usually occurring in childhood and adolescence: Secondary | ICD-10-CM

## 2022-04-11 ENCOUNTER — Encounter (HOSPITAL_BASED_OUTPATIENT_CLINIC_OR_DEPARTMENT_OTHER): Payer: Self-pay | Admitting: Emergency Medicine

## 2022-04-11 ENCOUNTER — Telehealth: Payer: Self-pay

## 2022-04-11 ENCOUNTER — Emergency Department (HOSPITAL_COMMUNITY): Payer: Commercial Managed Care - PPO

## 2022-04-11 ENCOUNTER — Inpatient Hospital Stay (HOSPITAL_BASED_OUTPATIENT_CLINIC_OR_DEPARTMENT_OTHER)
Admission: EM | Admit: 2022-04-11 | Discharge: 2022-04-15 | DRG: 330 | Disposition: A | Discharge status: Home / Self Care | Payer: Commercial Managed Care - PPO | Attending: Surgery | Admitting: Surgery

## 2022-04-11 ENCOUNTER — Other Ambulatory Visit: Payer: Self-pay

## 2022-04-11 DIAGNOSIS — K219 Gastro-esophageal reflux disease without esophagitis: Secondary | ICD-10-CM | POA: Diagnosis present

## 2022-04-11 DIAGNOSIS — K3580 Unspecified acute appendicitis: Secondary | ICD-10-CM | POA: Diagnosis present

## 2022-04-11 DIAGNOSIS — R1031 Right lower quadrant pain: Principal | ICD-10-CM

## 2022-04-11 DIAGNOSIS — E538 Deficiency of other specified B group vitamins: Secondary | ICD-10-CM | POA: Diagnosis present

## 2022-04-11 DIAGNOSIS — E039 Hypothyroidism, unspecified: Secondary | ICD-10-CM | POA: Diagnosis present

## 2022-04-11 DIAGNOSIS — Z803 Family history of malignant neoplasm of breast: Secondary | ICD-10-CM

## 2022-04-11 DIAGNOSIS — Z90710 Acquired absence of both cervix and uterus: Secondary | ICD-10-CM

## 2022-04-11 DIAGNOSIS — Z833 Family history of diabetes mellitus: Secondary | ICD-10-CM

## 2022-04-11 DIAGNOSIS — K6389 Other specified diseases of intestine: Secondary | ICD-10-CM | POA: Diagnosis present

## 2022-04-11 DIAGNOSIS — Z20822 Contact with and (suspected) exposure to covid-19: Secondary | ICD-10-CM | POA: Diagnosis present

## 2022-04-11 DIAGNOSIS — J45909 Unspecified asthma, uncomplicated: Secondary | ICD-10-CM | POA: Diagnosis present

## 2022-04-11 DIAGNOSIS — K5900 Constipation, unspecified: Secondary | ICD-10-CM | POA: Diagnosis present

## 2022-04-11 DIAGNOSIS — Z880 Allergy status to penicillin: Secondary | ICD-10-CM

## 2022-04-11 LAB — URINALYSIS, ROUTINE W REFLEX MICROSCOPIC
Bilirubin Urine: NEGATIVE
Glucose, UA: NEGATIVE mg/dL
Hgb urine dipstick: NEGATIVE
Ketones, ur: NEGATIVE mg/dL
Leukocytes,Ua: NEGATIVE
Nitrite: NEGATIVE
Protein, ur: NEGATIVE mg/dL
Specific Gravity, Urine: 1.015 (ref 1.005–1.030)
pH: 8.5 — ABNORMAL HIGH (ref 5.0–8.0)

## 2022-04-11 LAB — PROTIME-INR
INR: 1 (ref 0.8–1.2)
Prothrombin Time: 13.4 seconds (ref 11.4–15.2)

## 2022-04-11 LAB — CBC WITH DIFFERENTIAL/PLATELET
Abs Immature Granulocytes: 0.04 10*3/uL (ref 0.00–0.07)
Basophils Absolute: 0 10*3/uL (ref 0.0–0.1)
Basophils Relative: 0 %
Eosinophils Absolute: 0.2 10*3/uL (ref 0.0–0.5)
Eosinophils Relative: 2 %
HCT: 32.4 % — ABNORMAL LOW (ref 36.0–46.0)
Hemoglobin: 10.7 g/dL — ABNORMAL LOW (ref 12.0–15.0)
Immature Granulocytes: 0 %
Lymphocytes Relative: 9 %
Lymphs Abs: 1.1 10*3/uL (ref 0.7–4.0)
MCH: 29.6 pg (ref 26.0–34.0)
MCHC: 33 g/dL (ref 30.0–36.0)
MCV: 89.5 fL (ref 80.0–100.0)
Monocytes Absolute: 0.8 10*3/uL (ref 0.1–1.0)
Monocytes Relative: 6 %
Neutro Abs: 11 10*3/uL — ABNORMAL HIGH (ref 1.7–7.7)
Neutrophils Relative %: 83 %
Platelets: 403 10*3/uL — ABNORMAL HIGH (ref 150–400)
RBC: 3.62 MIL/uL — ABNORMAL LOW (ref 3.87–5.11)
RDW: 13.4 % (ref 11.5–15.5)
WBC: 13.1 10*3/uL — ABNORMAL HIGH (ref 4.0–10.5)
nRBC: 0 % (ref 0.0–0.2)

## 2022-04-11 LAB — COMPREHENSIVE METABOLIC PANEL
ALT: 18 U/L (ref 0–44)
AST: 17 U/L (ref 15–41)
Albumin: 3.5 g/dL (ref 3.5–5.0)
Alkaline Phosphatase: 52 U/L (ref 38–126)
Anion gap: 7 (ref 5–15)
BUN: 13 mg/dL (ref 6–20)
CO2: 27 mmol/L (ref 22–32)
Calcium: 9.1 mg/dL (ref 8.9–10.3)
Chloride: 104 mmol/L (ref 98–111)
Creatinine, Ser: 0.79 mg/dL (ref 0.44–1.00)
GFR, Estimated: >60 mL/min (ref >60)
Glucose, Bld: 115 mg/dL — ABNORMAL HIGH (ref 70–99)
Potassium: 4 mmol/L (ref 3.5–5.1)
Sodium: 138 mmol/L (ref 135–145)
Total Bilirubin: 0.4 mg/dL (ref 0.3–1.2)
Total Protein: 6.7 g/dL (ref 6.5–8.1)

## 2022-04-11 LAB — RESP PANEL BY RT-PCR (FLU A&B, COVID) ARPGX2
Influenza A by PCR: NEGATIVE
Influenza B by PCR: NEGATIVE
SARS Coronavirus 2 by RT PCR: NEGATIVE

## 2022-04-11 LAB — LACTIC ACID, PLASMA: Lactic Acid, Venous: 0.7 mmol/L (ref 0.5–1.9)

## 2022-04-11 LAB — LIPASE, BLOOD: Lipase: 22 U/L (ref 11–51)

## 2022-04-11 MED ORDER — IOHEXOL 300 MG/ML  SOLN
100.0000 mL | Freq: Once | INTRAMUSCULAR | Status: AC | PRN
  Administered 2022-04-11: 100 mL via INTRAVENOUS

## 2022-04-11 MED ORDER — CHLORHEXIDINE GLUCONATE CLOTH 2 % EX PADS: 6.0000 | MEDICATED_PAD | Freq: Once | CUTANEOUS | Status: DC

## 2022-04-11 MED ORDER — ENSURE PRE-SURGERY PO LIQD: 296.0000 mL | Freq: Once | ORAL | Status: DC

## 2022-04-11 MED ORDER — MELATONIN 3 MG PO TABS: 3.0000 mg | ORAL_TABLET | Freq: Every evening | ORAL | Status: DC | PRN

## 2022-04-11 MED ORDER — ENOXAPARIN SODIUM 40 MG/0.4ML IJ SOSY: 40.0000 mg | PREFILLED_SYRINGE | Freq: Once | INTRAMUSCULAR | Status: DC

## 2022-04-11 MED ORDER — MORPHINE SULFATE (PF) 2 MG/ML IV SOLN: 1.0000 mg | INTRAVENOUS | Status: DC | PRN

## 2022-04-11 MED ORDER — ENSURE PRE-SURGERY PO LIQD: 592.0000 mL | Freq: Once | ORAL | Status: DC

## 2022-04-11 MED ORDER — ALBUTEROL SULFATE (2.5 MG/3ML) 0.083% IN NEBU: INHALATION_SOLUTION | Freq: Four times a day (QID) | RESPIRATORY_TRACT | Status: DC | PRN

## 2022-04-11 MED ORDER — ALVIMOPAN 12 MG PO CAPS
12.0000 mg | ORAL_CAPSULE | ORAL | Status: AC
  Administered 2022-04-12: 12 mg via ORAL
  Filled 2022-04-11: qty 1

## 2022-04-11 MED ORDER — METRONIDAZOLE 500 MG/100ML IV SOLN: 500.0000 mg | Freq: Two times a day (BID) | INTRAVENOUS | Status: DC

## 2022-04-11 MED ORDER — KCL IN DEXTROSE-NACL 20-5-0.45 MEQ/L-%-% IV SOLN
INTRAVENOUS | Status: DC
  Filled 2022-04-11 (×3): qty 1000

## 2022-04-11 MED ORDER — SODIUM CHLORIDE 0.9 % IV SOLN: 2.0000 g | INTRAVENOUS | Status: DC

## 2022-04-11 MED ORDER — SIMETHICONE 80 MG PO CHEW: 40.0000 mg | CHEWABLE_TABLET | Freq: Four times a day (QID) | ORAL | Status: DC | PRN

## 2022-04-11 MED ORDER — POLYETHYLENE GLYCOL 3350 17 GM/SCOOP PO POWD: 1.0000 | Freq: Once | ORAL | Status: DC

## 2022-04-11 MED ORDER — METHYLPHENIDATE HCL 5 MG PO TABS
10.0000 mg | ORAL_TABLET | Freq: Three times a day (TID) | ORAL | Status: DC
  Filled 2022-04-11 (×3): qty 2

## 2022-04-11 MED ORDER — SODIUM CHLORIDE 0.9 % IV BOLUS
1000.0000 mL | Freq: Once | INTRAVENOUS | Status: AC
  Administered 2022-04-11: 1000 mL via INTRAVENOUS

## 2022-04-11 MED ORDER — BUPIVACAINE LIPOSOME 1.3 % IJ SUSP: 20.0000 mL | Freq: Once | INTRAMUSCULAR | Status: DC

## 2022-04-11 MED ORDER — METOPROLOL TARTRATE 5 MG/5ML IV SOLN: 5.0000 mg | Freq: Four times a day (QID) | INTRAVENOUS | Status: DC | PRN

## 2022-04-11 MED ORDER — MORPHINE SULFATE (PF) 4 MG/ML IV SOLN
4.0000 mg | Freq: Once | INTRAVENOUS | Status: AC
  Administered 2022-04-11: 4 mg via INTRAVENOUS
  Filled 2022-04-11: qty 1

## 2022-04-11 MED ORDER — ONDANSETRON HCL 4 MG/2ML IJ SOLN
4.0000 mg | Freq: Once | INTRAMUSCULAR | Status: AC
  Administered 2022-04-11: 4 mg via INTRAVENOUS
  Filled 2022-04-11: qty 2

## 2022-04-11 MED ORDER — ACETAMINOPHEN 500 MG PO TABS
1000.0000 mg | ORAL_TABLET | Freq: Four times a day (QID) | ORAL | Status: DC
  Administered 2022-04-11 – 2022-04-12 (×3): 1000 mg via ORAL
  Filled 2022-04-11 (×3): qty 2

## 2022-04-11 MED ORDER — ACETAMINOPHEN 500 MG PO TABS
1000.0000 mg | ORAL_TABLET | ORAL | Status: AC
  Administered 2022-04-12: 1000 mg via ORAL
  Filled 2022-04-11: qty 2

## 2022-04-11 MED ORDER — CHLORHEXIDINE GLUCONATE CLOTH 2 % EX PADS
6.0000 | MEDICATED_PAD | Freq: Once | CUTANEOUS | Status: AC
  Administered 2022-04-11: 6 via TOPICAL

## 2022-04-11 MED ORDER — TRAZODONE HCL 100 MG PO TABS
100.0000 mg | ORAL_TABLET | Freq: Every day | ORAL | Status: DC
Start: 2022-04-11 — End: 2022-04-15
  Administered 2022-04-11 – 2022-04-14 (×4): 100 mg via ORAL
  Filled 2022-04-11 (×4): qty 1

## 2022-04-11 MED ORDER — CIPROFLOXACIN IN D5W 400 MG/200ML IV SOLN
400.0000 mg | Freq: Two times a day (BID) | INTRAVENOUS | Status: DC
  Administered 2022-04-11 – 2022-04-13 (×4): 400 mg via INTRAVENOUS
  Filled 2022-04-11 (×4): qty 200

## 2022-04-11 MED ORDER — SODIUM CHLORIDE 0.9 % IV SOLN: 1.0000 g | INTRAVENOUS | Status: DC

## 2022-04-11 MED ORDER — ONDANSETRON 4 MG PO TBDP: 4.0000 mg | ORAL_TABLET | Freq: Four times a day (QID) | ORAL | Status: DC | PRN

## 2022-04-11 MED ORDER — FAMOTIDINE IN NACL 20-0.9 MG/50ML-% IV SOLN
20.0000 mg | Freq: Once | INTRAVENOUS | Status: AC
  Administered 2022-04-11: 20 mg via INTRAVENOUS
  Filled 2022-04-11: qty 50

## 2022-04-11 MED ORDER — ONDANSETRON HCL 4 MG/2ML IJ SOLN
4.0000 mg | Freq: Four times a day (QID) | INTRAMUSCULAR | Status: DC | PRN
  Administered 2022-04-11 – 2022-04-13 (×3): 4 mg via INTRAVENOUS
  Filled 2022-04-11 (×3): qty 2

## 2022-04-11 MED ORDER — KETOROLAC TROMETHAMINE 15 MG/ML IJ SOLN
15.0000 mg | Freq: Once | INTRAMUSCULAR | Status: AC
  Administered 2022-04-11: 15 mg via INTRAVENOUS
  Filled 2022-04-11: qty 1

## 2022-04-11 MED ORDER — POLYETHYLENE GLYCOL 3350 17 GM/SCOOP PO POWD
1.0000 | Freq: Once | ORAL | Status: AC
  Administered 2022-04-11: 255 g via ORAL
  Filled 2022-04-11: qty 255

## 2022-04-11 MED ORDER — DIPHENHYDRAMINE HCL 25 MG PO CAPS: 25.0000 mg | ORAL_CAPSULE | Freq: Four times a day (QID) | ORAL | Status: DC | PRN

## 2022-04-11 MED ORDER — DIPHENHYDRAMINE HCL 50 MG/ML IJ SOLN: 25.0000 mg | Freq: Four times a day (QID) | INTRAMUSCULAR | Status: DC | PRN

## 2022-04-11 MED ORDER — LORAZEPAM 2 MG/ML IJ SOLN
1.0000 mg | Freq: Once | INTRAMUSCULAR | Status: AC
  Administered 2022-04-11: 1 mg via INTRAVENOUS
  Filled 2022-04-11: qty 1

## 2022-04-11 MED ORDER — ENOXAPARIN SODIUM 40 MG/0.4ML IJ SOSY
40.0000 mg | PREFILLED_SYRINGE | INTRAMUSCULAR | Status: DC
  Administered 2022-04-12 – 2022-04-14 (×3): 40 mg via SUBCUTANEOUS
  Filled 2022-04-11 (×3): qty 0.4

## 2022-04-11 MED ORDER — LEVOTHYROXINE SODIUM 175 MCG PO TABS
175.0000 ug | ORAL_TABLET | Freq: Every day | ORAL | Status: DC
  Administered 2022-04-12 – 2022-04-15 (×4): 175 ug via ORAL
  Filled 2022-04-11 (×6): qty 1

## 2022-04-11 MED ORDER — LEVOTHYROXINE SODIUM 75 MCG PO TABS: 175.0000 ug | ORAL_TABLET | Freq: Every day | ORAL | Status: DC

## 2022-04-11 MED ORDER — OXYCODONE HCL 5 MG PO TABS
5.0000 mg | ORAL_TABLET | ORAL | Status: DC | PRN
  Administered 2022-04-12: 10 mg via ORAL
  Filled 2022-04-11: qty 2

## 2022-04-11 MED ORDER — METRONIDAZOLE 500 MG/100ML IV SOLN
500.0000 mg | Freq: Four times a day (QID) | INTRAVENOUS | Status: DC
  Administered 2022-04-11 – 2022-04-13 (×7): 500 mg via INTRAVENOUS
  Filled 2022-04-11 (×10): qty 100

## 2022-04-11 MED ORDER — FAMOTIDINE 20 MG PO TABS
40.0000 mg | ORAL_TABLET | Freq: Two times a day (BID) | ORAL | Status: DC
  Administered 2022-04-11 – 2022-04-15 (×8): 40 mg via ORAL
  Filled 2022-04-11 (×8): qty 2

## 2022-04-11 NOTE — ED Notes (Signed)
Notified Megan at [facility] triage of pending arrival. Iv secured. Instructed not to eat or drink. Pt ambulatory with husband

## 2022-04-11 NOTE — ED Provider Notes (Signed)
This is a 56 year old female who was transferred from [place] due to their [place] being down.  She underwent CT imaging here and there is an appendicitis that is likely being caused by a cecal mass.  I discussed this with the patient and her husband and they voiced understanding.  Surgery to see the patient.  She is requesting something for pain and anxiety, I will order this while she waits in the ED.  Physical Exam  BP 112/80   Pulse 94   Temp 99 F (37.2 C) (Oral)   Resp 18   Ht '5\' 6"'$  (1.676 m)   Wt 75.9 kg   LMP 07/31/2018   SpO2 99%   BMI 27.01 kg/m   Physical Exam Vitals and nursing note reviewed.  Constitutional:      Appearance: Normal appearance.  HENT:     Head: Normocephalic and atraumatic.  Eyes:     General: No scleral icterus.    Conjunctiva/sclera: Conjunctivae normal.  Pulmonary:     Effort: Pulmonary effort is normal. No respiratory distress.  Skin:    Findings: No rash.  Neurological:     Mental Status: She is alert.  Psychiatric:        Mood and Affect: Mood normal.     Procedures  Procedures  ED Course / MDM    Medical Decision Making Amount and/or Complexity of Data Reviewed Labs: ordered. Radiology: ordered.  Risk Prescription drug management. Decision regarding hospitalization.   Michel Bickers with general surgery says that they plan to admit the patient for surgery.   Darliss Ridgel 04/11/22 1438    Gareth Morgan, MD 04/11/22 2351

## 2022-04-11 NOTE — H&P (Signed)
Shelby Warren May 04, 1966  [id number].    Chief Complaint/Reason for Consult: acute appendicitis secondary to cecal mass  HPI:  This is a 56 yo female with a history of ADD, asthma, and hypothyroidism who began having periumbilical and right sided abdominal pain last night around 5pm.  She developed nausea and vomiting. She did have a temp of 101 and took some ibuprofen with minimal relief. She denies diarrhea or blood in her stool. States at baseline she has constipation and requires stool softeners and laxatives intermittently. She describes her stools as solid and brown.  Given her persistent pain she presented to the [facility] ED where she has been noted to have a WBC of 13, but the CT scanner is down so she was sent to [facility] for imaging.  Her imaging unfortunately reveals appendicitis secondary to a cecal mass obstructing the appendiceal orifice.  She had a colonoscopy at age 58 with 1 polyp that was benign and melanosis coli.  Given her CT scan findings we are asked to see her for further admission and treatment.  She denies smoking. Reports occasional, social alcohol use. Denies other drug use. Denies use of blood thinners  ROS: Review of Systems  Constitutional: Negative.   HENT: Negative.    Eyes: Negative.   Respiratory: Negative.    Cardiovascular: Negative.   Gastrointestinal:  Positive for abdominal pain, nausea and vomiting.  Genitourinary: Negative.   Musculoskeletal: Negative.   Skin: Negative.   Neurological: Negative.   Endo/Heme/Allergies: Negative.   Psychiatric/Behavioral:  The patient is nervous/anxious.   : Please see HPI  Family History  Problem Relation Age of Onset   Breast cancer Paternal Grandmother    Thyroid disease Paternal Grandmother    Diabetes Maternal Grandfather     Past Medical History:  Diagnosis Date   ADD (attention deficit disorder)    Allergic rhinitis    Asthma    B12 deficiency    Hypothyroidism     Past Surgical History:   Procedure Laterality Date   ANAL FISSURE REPAIR     lasic eye surgery     spincture muscle     TOTAL ABDOMINAL HYSTERECTOMY  07/04/2020   dr Helane Rima    Social History:  reports that she has never smoked. She has never used smokeless tobacco. She reports current alcohol use. She reports that she does not use drugs.  Allergies:  Allergies  Allergen Reactions   Fish-Derived Products    Penicillin G Other (See Comments)   Penicillins    Strawberry Extract     (Not in a hospital admission)    Physical Exam: Blood pressure 112/80, pulse 94, temperature 99 F (37.2 C), temperature source Oral, resp. rate 18, height '5\' 6"'$  (1.676 m), weight 75.9 kg, last menstrual period 07/31/2018, SpO2 99 %. General: pleasant, WD, WN white female who is laying in bed in NAD HEENT: head is normocephalic, atraumatic.  Sclera are noninjected. Anicteric sclerae, pupils equal  Heart: regular, rate, and rhythm.  Lungs: CTAB, no wheezes, rhonchi, or rales noted.  Respiratory effort nonlabored Abd: soft, palpation of RUQ cuases pain in RLQ, TTP RLQ without guarding or rebound tenderness, ND, no masses, hernias, or organomegaly MS: all 4 extremities are symmetrical with no cyanosis, clubbing, or edema. Skin: warm and dry with no masses, lesions, or rashes Neuro: nonfocal exam, normal gait Psych: A&Ox3 with an appropriate affect, anxious    Results for orders placed or performed during the hospital encounter of 04/11/22 (from the  past 48 hour(s))  CBC with Differential     Status: Abnormal   Collection Time: 04/11/22  7:01 AM  Result Value Ref Range   WBC 13.1 (H) 4.0 - 10.5 K/uL   RBC 3.62 (L) 3.87 - 5.11 MIL/uL   Hemoglobin 10.7 (L) 12.0 - 15.0 g/dL   HCT 32.4 (L) 36.0 - 46.0 %   MCV 89.5 80.0 - 100.0 fL   MCH 29.6 26.0 - 34.0 pg   MCHC 33.0 30.0 - 36.0 g/dL   RDW 13.4 11.5 - 15.5 %   Platelets 403 (H) 150 - 400 K/uL   nRBC 0.0 0.0 - 0.2 %   Neutrophils Relative % 83 %   Neutro Abs 11.0 (H) 1.7  - 7.7 K/uL   Lymphocytes Relative 9 %   Lymphs Abs 1.1 0.7 - 4.0 K/uL   Monocytes Relative 6 %   Monocytes Absolute 0.8 0.1 - 1.0 K/uL   Eosinophils Relative 2 %   Eosinophils Absolute 0.2 0.0 - 0.5 K/uL   Basophils Relative 0 %   Basophils Absolute 0.0 0.0 - 0.1 K/uL   Immature Granulocytes 0 %   Abs Immature Granulocytes 0.04 0.00 - 0.07 K/uL    Comment: Performed at [facility], [address] [place], [place] [address]  Comprehensive metabolic panel     Status: Abnormal   Collection Time: 04/11/22  7:01 AM  Result Value Ref Range   Sodium 138 135 - 145 mmol/L   Potassium 4.0 3.5 - 5.1 mmol/L   Chloride 104 98 - 111 mmol/L   CO2 27 22 - 32 mmol/L   Glucose, Bld 115 (H) 70 - 99 mg/dL    Comment: Glucose reference range applies only to samples taken after fasting for at least 8 hours.   BUN 13 6 - 20 mg/dL   Creatinine, Ser 0.79 0.44 - 1.00 mg/dL   Calcium 9.1 8.9 - 10.3 mg/dL   Total Protein 6.7 6.5 - 8.1 g/dL   Albumin 3.5 3.5 - 5.0 g/dL   AST 17 15 - 41 U/L   ALT 18 0 - 44 U/L   Alkaline Phosphatase 52 38 - 126 U/L   Total Bilirubin 0.4 0.3 - 1.2 mg/dL   GFR, Estimated >60 >60 mL/min    Comment: (NOTE) Calculated using the CKD-EPI Creatinine Equation (2021)    Anion gap 7 5 - 15    Comment: Performed at [facility], [address] [place], [place] [address]  Lipase, blood     Status: None   Collection Time: 04/11/22  7:14 AM  Result Value Ref Range   Lipase 22 11 - 51 U/L    Comment: Performed at [facility], [address] [place], [place] [address]  Urinalysis, Routine w reflex microscopic Anterior Nasal Swab     Status: Abnormal   Collection Time: 04/11/22  7:20 AM  Result Value Ref Range   Color, Urine YELLOW YELLOW   APPearance CLEAR CLEAR   Specific Gravity, Urine 1.015 1.005 - 1.030   pH 8.5 (H) 5.0 - 8.0   Glucose, UA NEGATIVE NEGATIVE mg/dL   Hgb urine dipstick NEGATIVE NEGATIVE   Bilirubin Urine NEGATIVE NEGATIVE    Ketones, ur NEGATIVE NEGATIVE mg/dL   Protein, ur NEGATIVE NEGATIVE mg/dL   Nitrite NEGATIVE NEGATIVE   Leukocytes,Ua NEGATIVE NEGATIVE    Comment: Microscopic not done on urines with negative protein, blood, leukocytes, nitrite, or glucose < 500 mg/dL. Performed at  [facility], [address] [place], [place] [address]   Resp Panel by RT-PCR (Flu A&B, Covid) Anterior Nasal Swab     Status: None   Collection Time: 04/11/22  7:50 AM   Specimen: Anterior Nasal Swab  Result Value Ref Range   SARS Coronavirus 2 by RT PCR NEGATIVE NEGATIVE    Comment: (NOTE) SARS-CoV-2 target nucleic acids are NOT DETECTED.  The SARS-CoV-2 RNA is generally detectable in upper respiratory specimens during the acute phase of infection. The lowest concentration of SARS-CoV-2 viral copies this assay can detect is 138 copies/mL. A negative result does not preclude SARS-Cov-2 infection and should not be used as the sole basis for treatment or other patient management decisions. A negative result may occur with  improper specimen collection/handling, submission of specimen other than nasopharyngeal swab, presence of viral mutation(s) within the areas targeted by this assay, and inadequate number of viral copies(<138 copies/mL). A negative result must be combined with clinical observations, patient history, and epidemiological information. The expected result is Negative.  Fact Sheet for Patients:  EntrepreneurPulse.com.au  Fact Sheet for Healthcare Providers:  IncredibleEmployment.be  This test is no t yet approved or cleared by the [place] FDA and  has been authorized for detection and/or diagnosis of SARS-CoV-2 by FDA under an Emergency Use Authorization (EUA). This EUA will remain  in effect (meaning this test can be used) for the duration of the COVID-19 declaration under Section 564(b)(1) of the Act, 21 U.S.C.section 360bbb-3(b)(1), unless the  authorization is terminated  or revoked sooner.       Influenza A by PCR NEGATIVE NEGATIVE   Influenza B by PCR NEGATIVE NEGATIVE    Comment: (NOTE) The Xpert Xpress SARS-CoV-2/FLU/RSV plus assay is intended as an aid in the diagnosis of influenza from Nasopharyngeal swab specimens and should not be used as a sole basis for treatment. Nasal washings and aspirates are unacceptable for Xpert Xpress SARS-CoV-2/FLU/RSV testing.  Fact Sheet for Patients: EntrepreneurPulse.com.au  Fact Sheet for Healthcare Providers: IncredibleEmployment.be  This test is not yet approved or cleared by the [place] FDA and has been authorized for detection and/or diagnosis of SARS-CoV-2 by FDA under an Emergency Use Authorization (EUA). This EUA will remain in effect (meaning this test can be used) for the duration of the COVID-19 declaration under Section 564(b)(1) of the Act, 21 U.S.C. section 360bbb-3(b)(1), unless the authorization is terminated or revoked.  Performed at [facility], [address] [place], [place] [address]   Lactic acid, plasma     Status: None   Collection Time: 04/11/22  8:10 AM  Result Value Ref Range   Lactic Acid, Venous 0.7 0.5 - 1.9 mmol/L    Comment: Performed at [facility], [address] [place], [place] [address]  Protime-INR     Status: None   Collection Time: 04/11/22  8:10 AM  Result Value Ref Range   Prothrombin Time 13.4 11.4 - 15.2 seconds   INR 1.0 0.8 - 1.2    Comment: (NOTE) INR goal varies based on device and disease states. Performed at [facility], [address] [place], [place] [address]    CT ABDOMEN PELVIS W CONTRAST  Result Date: 04/11/2022 CLINICAL DATA:  56 year old female history of acute onset of nonlocalized abdominal pain. EXAM: CT ABDOMEN AND PELVIS WITH CONTRAST TECHNIQUE: Multidetector CT imaging of the abdomen and pelvis was performed using the standard  protocol following bolus administration of  intravenous contrast. RADIATION DOSE REDUCTION: This exam was performed according to the departmental dose-optimization program which includes automated exposure control, adjustment of the mA and/or kV according to patient size and/or use of iterative reconstruction technique. CONTRAST:  131m OMNIPAQUE IOHEXOL 300 MG/ML  SOLN COMPARISON:  CT the abdomen and pelvis 10/25/2011. FINDINGS: Lower chest: Unremarkable. Hepatobiliary: No suspicious cystic or solid hepatic lesions. No intra or extrahepatic biliary ductal dilatation. Gallbladder is unremarkable in appearance. Pancreas: No pancreatic mass. No pancreatic ductal dilatation. No pancreatic or peripancreatic fluid collections or inflammatory changes. Spleen: Unremarkable. Adrenals/Urinary Tract: Bilateral kidneys and bilateral adrenal glands are normal in appearance. No hydroureteronephrosis. Urinary bladder is unremarkable in appearance. Stomach/Bowel: The appearance of the stomach is normal. No pathologic dilatation of small bowel or colon. Mass-like mural thickening is noted in the region of the cecum with surrounding haziness in the pericecal fat. Adjacent to this the appendix is dilated and appears inflamed, concerning for concurrent acute appendicitis. Appendix: Location: Inferior to the cecum Diameter: 17 mm Appendicolith: None Mucosal hyper-enhancement: Present Extraluminal gas: None Periappendiceal collection: Periappendiceal phlegmonous inflammation without discrete periappendiceal abscess. Vascular/Lymphatic: No significant atherosclerotic disease, aneurysm or dissection noted in the abdominal or pelvic vasculature. Numerous prominent borderline enlarged and mildly enlarged ileocolic lymph nodes are noted measuring up to 1 cm in short axis Reproductive: Status post hysterectomy. Ovaries are unremarkable in appearance. Other: Trace volume of ascites in the low anatomic pelvis. No pneumoperitoneum.  Musculoskeletal: There are no aggressive appearing lytic or blastic lesions noted in the visualized portions of the skeleton. IMPRESSION: 1. Findings are compatible with acute appendicitis. The cause of the acute appendicitis appears to be an obstructing malignant appearing acecal mass at the ostium of the appendix. Notably, there is haziness surrounding both the appendix (presumably from acute inflammation) and the cecum (potentially from locally infiltrative malignancy), along with lymphadenopathy in the ileocolic mesentery concerning for nodal metastatic disease. Surgical consultation is strongly recommended. Electronically Signed   By: DVinnie LangtonM.D.   On: 04/11/2022 10:12      Assessment/Plan Acute appendicitis secondary to cecal mass, with surrounding lymphadenopathy concerning for local metastasis The patient has been seen and examined, chart, labs, vitals, and imaging have all been personally reviewed.  She appears to have appendicitis with a classic story but secondary to obstruction from a cecal mass.  This appears to likely be malignant with surrounding lymphadenopathy concerning for local metastatic spread on CT.  This has been discussed with the patient.  At this point, we recommend proceeding with a right colectomy to address the appendicitis but also the mass.  We will try to do en bloc resection to include lymph nodes given CT scan findings as well.  This was all discussed with the patient explaining diagnosis and plan.  She understands and is agreeable to proceed.  Bowel prep tonight and tentative plan for surgery tomorrow. Preop ERAS protocol ordered.   FEN - CLD, NPO at 0430  VTE - lovenox to start tomorrow post op ID - Cipro/Flagyl Admit - inpatient, med-surg  Asthma - resume prn albuterol ADD - resume home meds Hypothyroidism - resume synthroid  I reviewed ED provider notes, last 24 h vitals and pain scores, last 48 h intake and output, last 24 h labs and trends, and  last 24 h imaging results.  EObie Dredge [facility] 04/11/2022, 11:05 AM Please see Amion for pager number during day hours 7:00am-4:30pm or 7:00am -11:30am on weekends

## 2022-04-11 NOTE — ED Provider Notes (Signed)
[facility] EMERGENCY DEPARTMENT Provider Note   CSN: [id number] Arrival date & time: 04/11/22  8242     History  Chief Complaint  Patient presents with   Abdominal Pain    Shelby Warren is a 56 y.o. female.  Patient as above with significant medical history as below, including asthma, hypothyroid, hysterectomy (TV) who presents to the ED with complaint of abd pain.  Onset of pain around 5 to 6 PM yesterday evening.  Initially was periumbilical that has progressed to right lower quadrant.  Associate with nausea and vomiting.  Some diarrhea.  No BRBPR or melena.  Poor appetite last 12 hours.  No chest pain or dyspnea.  She had fever 101 Tmax at home, last dose of Motrin was around 5 or 6 AM this morning.  Last p.o. intake was around 6 PM last night, grits.  No recent medication or diet changes, no recent travel or sick contacts.  No rashes.  No trauma.  No change to urination.  No vaginal bleeding or discharge the patient feels is abnormal     Past Medical History:  Diagnosis Date   ADD (attention deficit disorder)    Allergic rhinitis    Asthma    B12 deficiency    Hypothyroidism     Past Surgical History:  Procedure Laterality Date   ANAL FISSURE REPAIR     lasic eye surgery     spincture muscle     TOTAL ABDOMINAL HYSTERECTOMY  07/04/2020   dr Helane Rima     The history is provided by the patient and the spouse. No language interpreter was used.  Abdominal Pain Associated symptoms: diarrhea, fever, nausea and vomiting   Associated symptoms: no chest pain, no chills, no cough, no hematuria and no shortness of breath        Home Medications Prior to Admission medications   Medication Sig Start Date End Date Taking? Authorizing Provider  albuterol (VENTOLIN HFA) 108 (90 Base) MCG/ACT inhaler Inhale 2 puffs into the lungs every 6 (six) hours as needed for wheezing or shortness of breath. 11/20/21   Terrilyn Saver, NP  famotidine (PEPCID) 40 MG tablet TAKE 1  TABLET(40 MG) BY MOUTH TWICE DAILY 01/08/22   Carollee Herter, Alferd Apa, DO  Fluticasone-Umeclidin-Vilant (TRELEGY ELLIPTA) 100-62.5-25 MCG/ACT AEPB 1 inh qd 12/04/21   Ann Held, DO  methylphenidate (RITALIN) 10 MG tablet Take 1 tablet (10 mg total) by mouth 3 (three) times daily with meals. 04/09/22   Shelda Pal, DO  ondansetron (ZOFRAN-ODT) 8 MG disintegrating tablet Take 1 tablet (8 mg total) by mouth every 8 (eight) hours as needed for nausea. 11/29/21   Terrilyn Saver, NP  phentermine (ADIPEX-P) 37.5 MG tablet Take 37.5 mg by mouth daily. 08/10/20   [provider]  promethazine-dextromethorphan (PROMETHAZINE-DM) 6.25-15 MG/5ML syrup Take 5 mLs by mouth 4 (four) times daily as needed. 12/04/21   Ann Held, DO  SYNTHROID 175 MCG tablet TAKE 1 TABLET(175 MCG) BY MOUTH DAILY 03/26/22   Carollee Herter, Alferd Apa, DO  traZODone (DESYREL) 100 MG tablet TAKE 1 TABLET(100 MG) BY MOUTH AT BEDTIME 12/20/21   Carollee Herter, Alferd Apa, DO      Allergies    Fish-derived products, Penicillin g, Penicillins, and Strawberry extract    Review of Systems   Review of Systems  Constitutional:  Positive for appetite change and fever. Negative for chills.  HENT:  Negative for facial swelling and trouble swallowing.  Eyes:  Negative for photophobia and visual disturbance.  Respiratory:  Negative for cough and shortness of breath.   Cardiovascular:  Negative for chest pain and palpitations.  Gastrointestinal:  Positive for abdominal pain, diarrhea, nausea and vomiting.  Endocrine: Negative for polydipsia and polyuria.  Genitourinary:  Negative for difficulty urinating and hematuria.  Musculoskeletal:  Negative for gait problem and joint swelling.  Skin:  Negative for pallor and rash.  Neurological:  Negative for syncope and headaches.  Psychiatric/Behavioral:  Negative for agitation and confusion.     Physical Exam Updated Vital Signs BP 130/78   Pulse 83   Temp 98.1 F (36.7  C) (Oral)   Resp 16   Ht '5\' 6"'$  (1.676 m)   Wt 75.9 kg   LMP 07/31/2018   SpO2 100%   BMI 27.01 kg/m  Physical Exam Vitals and nursing note reviewed.  Constitutional:      General: She is not in acute distress.    Appearance: Normal appearance. She is well-developed. She is not ill-appearing or diaphoretic.  HENT:     Head: Normocephalic and atraumatic.     Right Ear: External ear normal.     Left Ear: External ear normal.     Nose: Nose normal.     Mouth/Throat:     Mouth: Mucous membranes are moist.  Eyes:     General: No scleral icterus.       Right eye: No discharge.        Left eye: No discharge.  Cardiovascular:     Rate and Rhythm: Normal rate and regular rhythm.     Pulses: Normal pulses.     Heart sounds: Normal heart sounds.  Pulmonary:     Effort: Pulmonary effort is normal. No respiratory distress.     Breath sounds: Normal breath sounds.  Abdominal:     General: Abdomen is flat.     Palpations: Abdomen is soft.     Tenderness: There is abdominal tenderness. There is rebound. Positive signs include Rovsing's sign and McBurney's sign.    Musculoskeletal:        General: Normal range of motion.     Cervical back: Normal range of motion.     Right lower leg: No edema.     Left lower leg: No edema.  Skin:    General: Skin is warm and dry.     Capillary Refill: Capillary refill takes less than 2 seconds.  Neurological:     Mental Status: She is alert and oriented to person, place, and time.     GCS: GCS eye subscore is 4. GCS verbal subscore is 5. GCS motor subscore is 6.  Psychiatric:        Mood and Affect: Mood normal.        Behavior: Behavior normal.     ED Results / Procedures / Treatments   Labs (all labs ordered are listed, but only abnormal results are displayed) Labs Reviewed  CBC WITH DIFFERENTIAL/PLATELET - Abnormal; Notable for the following components:      Result Value   WBC 13.1 (*)    RBC 3.62 (*)    Hemoglobin 10.7 (*)    HCT  32.4 (*)    Platelets 403 (*)    Neutro Abs 11.0 (*)    All other components within normal limits  COMPREHENSIVE METABOLIC PANEL - Abnormal; Notable for the following components:   Glucose, Bld 115 (*)    All other components within normal limits  URINALYSIS, ROUTINE W  REFLEX MICROSCOPIC - Abnormal; Notable for the following components:   pH 8.5 (*)    All other components within normal limits  RESP PANEL BY RT-PCR (FLU A&B, COVID) ARPGX2  LIPASE, BLOOD  LACTIC ACID, PLASMA  PROTIME-INR  HIV ANTIBODY (ROUTINE TESTING W REFLEX)  CEA  HEMOGLOBIN A1C    EKG None  Radiology No results found.  Procedures Procedures    Medications Ordered in ED Medications  sodium chloride 0.9 % bolus 1,000 mL (1,000 mLs Intravenous New Bag/Given 04/11/22 0731)  ketorolac (TORADOL) 15 MG/ML injection 15 mg (15 mg Intravenous Given 04/11/22 0745)  ondansetron (ZOFRAN) injection 4 mg (4 mg Intravenous Given 04/11/22 0747)  famotidine (PEPCID) IVPB 20 mg premix (20 mg Intravenous New Bag/Given 04/11/22 0751)  morphine (PF) 4 MG/ML injection 4 mg (4 mg Intravenous Given 04/11/22 0744)    ED Course/ Medical Decision Making/ A&P                           Medical Decision Making Amount and/or Complexity of Data Reviewed Labs: ordered. Radiology: ordered.  Risk Prescription drug management. Decision regarding hospitalization.    CC: abd pain  This patient presents to the Emergency Department for the above complaint. This involves an extensive number of treatment options and is a complaint that carries with it a high risk of complications and morbidity. Vital signs were reviewed. Serious etiologies considered.  Differential diagnosis includes but is not exclusive to ectopic pregnancy, ovarian cyst, ovarian torsion, acute appendicitis, urinary tract infection, endometriosis, bowel obstruction, hernia, colitis, renal colic, gastroenteritis, volvulus etc.  Patient with constant right lower quad  abdominal pain.  Initially began as periumbilical.  She has positive rebound tenderness, positive Rovsing sign.  Abdomen is soft.  Record review:  Previous records obtained and reviewed prior office visits, prior labs and imaging  Additional history obtained from spouse  Medical and surgical history as noted above.   Work up as above, notable for:  Labs & imaging results that were available during my care of the patient were visualized by me and considered in my medical decision making.  Physical exam as above.   I ordered imaging studies which included CT abdomen pelvis.  Unfortunately the CT imaging device is broken currently.  I have high index of suspicion for acute intra-abdominal pathology would recommend patient be transferred to [facility] for imaging CT.  Cardiac monitoring reviewed and interpreted personally which shows NSR  Labs reviewed, she has leukocytosis 13.1, hemoglobin 10.7, mildly reduced from her baseline.  Normocytic, no BRBPR or melena reported, no increased bruising. LA is not elevated, viral swab neg, UA neg.   Personally discussed patient care with consultant; CCS PA  Management: IV fluids, antiemetic, analgesia  ED Course:     Reassessment:  Patient does report improvement to her symptoms following analgesics.  Nausea has improved.  Given the above recommend transfer to [facility] for imaging and pos surgery evaluation.  Patient and spouse are agreeable.  Spoke with Dr. Loletha Grayer Tegeler who assessed patient at [facility].  We will send by POV per patient request. She is HDS.       Admission was considered.            Social determinants of health include -  Social History   Socioeconomic History   Marital status: Married    Spouse name: Not on file   Number of children: 2   Years of education: Not on  file   Highest education level: Not on file  Occupational History    Employer: [employer]   Occupation: Homemaker  Tobacco Use    Smoking status: Never   Smokeless tobacco: Never  Vaping Use   Vaping Use: Never used  Substance and Sexual Activity   Alcohol use: Yes    Comment: occasional    Drug use: No   Sexual activity: Not on file  Other Topics Concern   Not on file  Social History Narrative   Not on file   Social Determinants of Health   Financial Resource Strain: Not on file  Food Insecurity: Not on file  Transportation Needs: Not on file  Physical Activity: Not on file  Stress: Not on file  Social Connections: Not on file  Intimate Partner Violence: Not on file      This chart was dictated using voice recognition software.  Despite best efforts to proofread,  errors can occur which can change the documentation meaning.         Final Clinical Impression(s) / ED Diagnoses Final diagnoses:  Right lower quadrant abdominal pain    Rx / DC Orders ED Discharge Orders     None         Jeanell Sparrow, DO 04/11/22 1522

## 2022-04-11 NOTE — ED Notes (Signed)
[facility] triage aware of pt pending arrival POV

## 2022-04-11 NOTE — Progress Notes (Signed)
1545- Patient arrived onto the unit into 6N12. A&O x4. VSS. Denies pain. Right FA 20 gauge PIV in place.  Husband at bedside. Admission completed. Updated on plan of care, all questions/concerns addressed.

## 2022-04-11 NOTE — Discharge Instructions (Addendum)
It was a pleasure caring for you today in the emergency department.  Please return to the emergency department for any worsening or worrisome symptoms.   Call [place] office regarding path on Monday afternoon or Tuesday.

## 2022-04-11 NOTE — ED Triage Notes (Signed)
Pt c/o RLQ abd pain with nausea since last night. Pt reports fever of 101 last night.

## 2022-04-11 NOTE — Telephone Encounter (Signed)
Error---To close

## 2022-04-11 NOTE — ED Notes (Addendum)
Disregard - Patient going [facility] for transport to [place] for CT Scan.  Dr. Levell July accepting

## 2022-04-11 NOTE — Anesthesia Preprocedure Evaluation (Addendum)
Anesthesia Evaluation  Patient identified by MRN, date of birth, ID band Patient awake    Reviewed: Allergy & Precautions, NPO status , Patient's Chart, lab work & pertinent test results  Airway Mallampati: II  TM Distance: >3 FB Neck ROM: Full    Dental no notable dental hx. (+) Teeth Intact, Dental Advisory Given   Pulmonary asthma ,    Pulmonary exam normal breath sounds clear to auscultation       Cardiovascular Exercise Tolerance: Good negative cardio ROS Normal cardiovascular exam Rhythm:Regular Rate:Normal     Neuro/Psych Anxiety    GI/Hepatic Neg liver ROS, GERD  ,Cecal mass   Endo/Other  Hypothyroidism   Renal/GU negative Renal ROSLab Results      Component                Value               Date                      CREATININE               0.79                04/11/2022                   K                        4.0                 04/11/2022                    Musculoskeletal negative musculoskeletal ROS (+)   Abdominal   Peds  Hematology  (+) Blood dyscrasia, anemia , Lab Results      Component                Value               Date                           HGB                      10.7 (L)            04/11/2022                HCT                      32.4 (L)            04/11/2022               PLT                      403 (H)             04/11/2022              Anesthesia Other Findings All:PCN  Reproductive/Obstetrics                           Anesthesia Physical Anesthesia Plan  ASA: 2  Anesthesia Plan: General   Post-op Pain Management: Lidocaine infusion*, Ketamine IV* and Minimal or no pain anticipated   Induction:   PONV Risk Score and Plan:   Airway Management Planned: Oral ETT  Additional Equipment: None  Intra-op Plan:   Post-operative Plan: Extubation in OR  Informed Consent: I have reviewed the patients History and Physical, chart, labs and  discussed the procedure including the risks, benefits and alternatives for the proposed anesthesia with the patient or authorized representative who has indicated his/her understanding and acceptance.     Dental advisory given  Plan Discussed with:   Anesthesia Plan Comments:        Anesthesia Quick Evaluation

## 2022-04-12 ENCOUNTER — Inpatient Hospital Stay (HOSPITAL_COMMUNITY): Payer: Commercial Managed Care - PPO | Admitting: Anesthesiology

## 2022-04-12 ENCOUNTER — Encounter (HOSPITAL_COMMUNITY): Admission: EM | Disposition: A | Discharge status: Home / Self Care | Payer: Self-pay | Source: Home / Self Care

## 2022-04-12 ENCOUNTER — Other Ambulatory Visit: Payer: Self-pay

## 2022-04-12 ENCOUNTER — Encounter (HOSPITAL_COMMUNITY): Payer: Self-pay

## 2022-04-12 DIAGNOSIS — K6389 Other specified diseases of intestine: Secondary | ICD-10-CM | POA: Diagnosis not present

## 2022-04-12 HISTORY — PX: LAPAROSCOPIC RIGHT HEMI COLECTOMY: SHX5926

## 2022-04-12 HISTORY — PX: APPENDECTOMY: SHX54

## 2022-04-12 HISTORY — PX: LIVER BIOPSY: SHX301

## 2022-04-12 LAB — TYPE AND SCREEN
ABO/RH(D): A POS
Antibody Screen: NEGATIVE

## 2022-04-12 LAB — CBC
HCT: 33 % — ABNORMAL LOW (ref 36.0–46.0)
Hemoglobin: 10.3 g/dL — ABNORMAL LOW (ref 12.0–15.0)
MCH: 28.9 pg (ref 26.0–34.0)
MCHC: 31.2 g/dL (ref 30.0–36.0)
MCV: 92.7 fL (ref 80.0–100.0)
Platelets: 398 10*3/uL (ref 150–400)
RBC: 3.56 MIL/uL — ABNORMAL LOW (ref 3.87–5.11)
RDW: 13.5 % (ref 11.5–15.5)
WBC: 7.3 10*3/uL (ref 4.0–10.5)
nRBC: 0 % (ref 0.0–0.2)

## 2022-04-12 LAB — ABO/RH: ABO/RH(D): A POS

## 2022-04-12 LAB — BASIC METABOLIC PANEL
Anion gap: 8 (ref 5–15)
BUN: 10 mg/dL (ref 6–20)
CO2: 27 mmol/L (ref 22–32)
Calcium: 8.9 mg/dL (ref 8.9–10.3)
Chloride: 105 mmol/L (ref 98–111)
Creatinine, Ser: 0.89 mg/dL (ref 0.44–1.00)
GFR, Estimated: >60 mL/min (ref >60)
Glucose, Bld: 106 mg/dL — ABNORMAL HIGH (ref 70–99)
Potassium: 4 mmol/L (ref 3.5–5.1)
Sodium: 140 mmol/L (ref 135–145)

## 2022-04-12 LAB — HIV ANTIBODY (ROUTINE TESTING W REFLEX): HIV Screen 4th Generation wRfx: NONREACTIVE

## 2022-04-12 LAB — HEMOGLOBIN A1C
Hgb A1c MFr Bld: 4.8 % (ref 4.8–5.6)
Mean Plasma Glucose: 91.06 mg/dL

## 2022-04-12 SURGERY — LAPAROSCOPIC RIGHT HEMI COLECTOMY
Anesthesia: General | Site: Abdomen | Laterality: Right

## 2022-04-12 MED ORDER — HYDROMORPHONE HCL 1 MG/ML IJ SOLN
0.2500 mg | INTRAMUSCULAR | Status: DC | PRN
  Administered 2022-04-12 (×2): 0.5 mg via INTRAVENOUS

## 2022-04-12 MED ORDER — LACTATED RINGERS IV SOLN: INTRAVENOUS | Status: DC

## 2022-04-12 MED ORDER — ENSURE SURGERY PO LIQD
237.0000 mL | Freq: Two times a day (BID) | ORAL | Status: DC
  Administered 2022-04-13 – 2022-04-15 (×4): 237 mL via ORAL
  Filled 2022-04-12 (×7): qty 237

## 2022-04-12 MED ORDER — PHENYLEPHRINE HCL-NACL 20-0.9 MG/250ML-% IV SOLN
INTRAVENOUS | Status: DC | PRN
  Administered 2022-04-12: 20 ug/min via INTRAVENOUS

## 2022-04-12 MED ORDER — LIDOCAINE 2% (20 MG/ML) 5 ML SYRINGE
INTRAMUSCULAR | Status: DC | PRN
  Administered 2022-04-12: 80 mg via INTRAVENOUS

## 2022-04-12 MED ORDER — LIDOCAINE IN D5W 4-5 MG/ML-% IV SOLN
INTRAVENOUS | Status: DC | PRN
  Administered 2022-04-12: 1.45 mg/min via INTRAVENOUS

## 2022-04-12 MED ORDER — AMISULPRIDE (ANTIEMETIC) 5 MG/2ML IV SOLN
INTRAVENOUS | Status: AC
  Filled 2022-04-12: qty 2

## 2022-04-12 MED ORDER — KETOROLAC TROMETHAMINE 30 MG/ML IJ SOLN
30.0000 mg | Freq: Once | INTRAMUSCULAR | Status: AC | PRN
  Administered 2022-04-12: 30 mg via INTRAVENOUS

## 2022-04-12 MED ORDER — DEXAMETHASONE SODIUM PHOSPHATE 10 MG/ML IJ SOLN
INTRAMUSCULAR | Status: DC | PRN
  Administered 2022-04-12: 5 mg via INTRAVENOUS

## 2022-04-12 MED ORDER — FENTANYL CITRATE (PF) 250 MCG/5ML IJ SOLN
INTRAMUSCULAR | Status: DC | PRN
  Administered 2022-04-12 (×4): 50 ug via INTRAVENOUS

## 2022-04-12 MED ORDER — KETOROLAC TROMETHAMINE 30 MG/ML IJ SOLN
INTRAMUSCULAR | Status: AC
  Filled 2022-04-12: qty 1

## 2022-04-12 MED ORDER — SODIUM CHLORIDE 0.9 % IV SOLN: INTRAVENOUS | Status: DC

## 2022-04-12 MED ORDER — BUPIVACAINE LIPOSOME 1.3 % IJ SUSP
INTRAMUSCULAR | Status: AC
  Filled 2022-04-12: qty 20

## 2022-04-12 MED ORDER — HYDROMORPHONE HCL 1 MG/ML IJ SOLN
INTRAMUSCULAR | Status: AC
  Filled 2022-04-12: qty 1

## 2022-04-12 MED ORDER — METHOCARBAMOL 500 MG PO TABS
500.0000 mg | ORAL_TABLET | Freq: Three times a day (TID) | ORAL | Status: DC | PRN
  Administered 2022-04-14 (×2): 500 mg via ORAL
  Filled 2022-04-12 (×2): qty 1

## 2022-04-12 MED ORDER — OXYCODONE HCL 5 MG/5ML PO SOLN: 5.0000 mg | Freq: Once | ORAL | Status: DC | PRN

## 2022-04-12 MED ORDER — LORAZEPAM 0.5 MG PO TABS
0.5000 mg | ORAL_TABLET | Freq: Once | ORAL | Status: AC
  Administered 2022-04-12: 0.5 mg via ORAL
  Filled 2022-04-12: qty 1

## 2022-04-12 MED ORDER — SUGAMMADEX SODIUM 200 MG/2ML IV SOLN
INTRAVENOUS | Status: DC | PRN
  Administered 2022-04-12: 200 mg via INTRAVENOUS

## 2022-04-12 MED ORDER — MORPHINE SULFATE (PF) 4 MG/ML IV SOLN
4.0000 mg | INTRAVENOUS | Status: DC | PRN
  Administered 2022-04-13 – 2022-04-15 (×8): 4 mg via INTRAVENOUS
  Filled 2022-04-12 (×8): qty 1

## 2022-04-12 MED ORDER — LACTATED RINGERS IV SOLN: INTRAVENOUS | Status: DC | PRN

## 2022-04-12 MED ORDER — FENTANYL CITRATE (PF) 250 MCG/5ML IJ SOLN
INTRAMUSCULAR | Status: AC
  Filled 2022-04-12: qty 5

## 2022-04-12 MED ORDER — PHENYLEPHRINE 80 MCG/ML (10ML) SYRINGE FOR IV PUSH (FOR BLOOD PRESSURE SUPPORT)
PREFILLED_SYRINGE | INTRAVENOUS | Status: DC | PRN
  Administered 2022-04-12 (×2): 40 ug via INTRAVENOUS

## 2022-04-12 MED ORDER — PHENYLEPHRINE 80 MCG/ML (10ML) SYRINGE FOR IV PUSH (FOR BLOOD PRESSURE SUPPORT)
PREFILLED_SYRINGE | INTRAVENOUS | Status: AC
  Filled 2022-04-12: qty 10

## 2022-04-12 MED ORDER — AMISULPRIDE (ANTIEMETIC) 5 MG/2ML IV SOLN
5.0000 mg | Freq: Once | INTRAVENOUS | Status: AC
Start: 2022-04-12 — End: 2022-04-12
  Administered 2022-04-12: 5 mg via INTRAVENOUS

## 2022-04-12 MED ORDER — CHLORHEXIDINE GLUCONATE 0.12 % MT SOLN
OROMUCOSAL | Status: AC
  Administered 2022-04-12: 15 mL via OROMUCOSAL
  Filled 2022-04-12: qty 15

## 2022-04-12 MED ORDER — MIDAZOLAM HCL 2 MG/2ML IJ SOLN
INTRAMUSCULAR | Status: DC | PRN
  Administered 2022-04-12: 2 mg via INTRAVENOUS

## 2022-04-12 MED ORDER — ALVIMOPAN 12 MG PO CAPS
12.0000 mg | ORAL_CAPSULE | Freq: Two times a day (BID) | ORAL | Status: DC
  Administered 2022-04-13: 12 mg via ORAL
  Filled 2022-04-12 (×2): qty 1

## 2022-04-12 MED ORDER — MIDAZOLAM HCL 2 MG/2ML IJ SOLN
INTRAMUSCULAR | Status: AC
  Filled 2022-04-12: qty 2

## 2022-04-12 MED ORDER — ONDANSETRON HCL 4 MG/2ML IJ SOLN
INTRAMUSCULAR | Status: DC | PRN
  Administered 2022-04-12: 4 mg via INTRAVENOUS

## 2022-04-12 MED ORDER — HYDROXYZINE HCL 25 MG PO TABS
25.0000 mg | ORAL_TABLET | Freq: Three times a day (TID) | ORAL | Status: DC | PRN
  Administered 2022-04-12 – 2022-04-14 (×4): 25 mg via ORAL
  Filled 2022-04-12 (×4): qty 1

## 2022-04-12 MED ORDER — PROPOFOL 10 MG/ML IV BOLUS
INTRAVENOUS | Status: DC | PRN
  Administered 2022-04-12: 130 mg via INTRAVENOUS
  Administered 2022-04-12: 20 mg via INTRAVENOUS

## 2022-04-12 MED ORDER — OXYCODONE HCL 5 MG PO TABS: 5.0000 mg | ORAL_TABLET | Freq: Once | ORAL | Status: DC | PRN

## 2022-04-12 MED ORDER — CHLORHEXIDINE GLUCONATE 0.12 % MT SOLN
15.0000 mL | Freq: Once | OROMUCOSAL | Status: AC
Start: 2022-04-12 — End: 2022-04-12

## 2022-04-12 MED ORDER — ORAL CARE MOUTH RINSE: 15.0000 mL | Freq: Once | OROMUCOSAL | Status: AC

## 2022-04-12 MED ORDER — KETAMINE HCL 50 MG/5ML IJ SOSY
PREFILLED_SYRINGE | INTRAMUSCULAR | Status: AC
  Filled 2022-04-12: qty 5

## 2022-04-12 MED ORDER — ACETAMINOPHEN 500 MG PO TABS
1000.0000 mg | ORAL_TABLET | Freq: Four times a day (QID) | ORAL | Status: DC
  Administered 2022-04-12 – 2022-04-15 (×10): 1000 mg via ORAL
  Filled 2022-04-12 (×10): qty 2

## 2022-04-12 MED ORDER — OXYCODONE HCL 5 MG PO TABS
5.0000 mg | ORAL_TABLET | ORAL | Status: DC | PRN
  Administered 2022-04-13 – 2022-04-14 (×5): 10 mg via ORAL
  Administered 2022-04-14: 5 mg via ORAL
  Administered 2022-04-14 – 2022-04-15 (×4): 10 mg via ORAL
  Filled 2022-04-12 (×10): qty 2

## 2022-04-12 MED ORDER — SODIUM CHLORIDE 0.9 % IV SOLN
INTRAVENOUS | Status: DC | PRN
  Administered 2022-04-12: 40 mL

## 2022-04-12 MED ORDER — ROCURONIUM BROMIDE 10 MG/ML (PF) SYRINGE
PREFILLED_SYRINGE | INTRAVENOUS | Status: DC | PRN
  Administered 2022-04-12: 20 mg via INTRAVENOUS
  Administered 2022-04-12: 70 mg via INTRAVENOUS

## 2022-04-12 MED ORDER — KETAMINE HCL 10 MG/ML IJ SOLN
INTRAMUSCULAR | Status: DC | PRN
  Administered 2022-04-12: 20 mg via INTRAVENOUS
  Administered 2022-04-12: 10 mg via INTRAVENOUS

## 2022-04-12 MED ORDER — ONDANSETRON HCL 4 MG/2ML IJ SOLN
INTRAMUSCULAR | Status: AC
  Filled 2022-04-12: qty 2

## 2022-04-12 MED ORDER — 0.9 % SODIUM CHLORIDE (POUR BTL) OPTIME
TOPICAL | Status: DC | PRN
  Administered 2022-04-12: 1000 mL

## 2022-04-12 MED ORDER — ONDANSETRON HCL 4 MG/2ML IJ SOLN
4.0000 mg | Freq: Once | INTRAMUSCULAR | Status: AC | PRN
  Administered 2022-04-12: 4 mg via INTRAVENOUS

## 2022-04-12 SURGICAL SUPPLY — 88 items
ADH SKN CLS APL DERMABOND .7 (GAUZE/BANDAGES/DRESSINGS) ×1
APL PRP STRL LF DISP 70% ISPRP (MISCELLANEOUS) ×2
APPLIER CLIP 5 13 M/L LIGAMAX5 (MISCELLANEOUS)
APPLIER CLIP ROT 10 11.4 M/L (STAPLE)
APR CLP MED LRG 11.4X10 (STAPLE)
APR CLP MED LRG 5 ANG JAW (MISCELLANEOUS)
BAG COUNTER SPONGE SURGICOUNT (BAG) ×3 IMPLANT
BAG SPNG CNTER NS LX DISP (BAG) ×2
BLADE CLIPPER SURG (BLADE) IMPLANT
BLADE SURG 10 STRL SS (BLADE) ×1 IMPLANT
CANISTER SUCT 3000ML PPV (MISCELLANEOUS) ×3 IMPLANT
CELLS DAT CNTRL 66122 CELL SVR (MISCELLANEOUS) IMPLANT
CHLORAPREP W/TINT 26 (MISCELLANEOUS) ×3 IMPLANT
CLIP APPLIE 5 13 M/L LIGAMAX5 (MISCELLANEOUS) IMPLANT
CLIP APPLIE ROT 10 11.4 M/L (STAPLE) IMPLANT
COVER MAYO STAND STRL (DRAPES) ×3 IMPLANT
COVER SURGICAL LIGHT HANDLE (MISCELLANEOUS) ×6 IMPLANT
DERMABOND ADVANCED (GAUZE/BANDAGES/DRESSINGS) ×1
DERMABOND ADVANCED .7 DNX12 (GAUZE/BANDAGES/DRESSINGS) IMPLANT
DRAPE HALF SHEET 40X57 (DRAPES) ×3 IMPLANT
DRAPE INCISE IOBAN 66X45 STRL (DRAPES) IMPLANT
DRAPE UTILITY XL STRL (DRAPES) ×3 IMPLANT
DRAPE WARM FLUID 44X44 (DRAPES) ×3 IMPLANT
DRSG OPSITE POSTOP 4X10 (GAUZE/BANDAGES/DRESSINGS) IMPLANT
DRSG OPSITE POSTOP 4X8 (GAUZE/BANDAGES/DRESSINGS) IMPLANT
ELECT BLADE 6.5 EXT (BLADE) ×3 IMPLANT
ELECT CAUTERY BLADE 6.4 (BLADE) ×6 IMPLANT
ELECT REM PT RETURN 9FT ADLT (ELECTROSURGICAL) ×3
ELECTRODE REM PT RTRN 9FT ADLT (ELECTROSURGICAL) ×2 IMPLANT
GEL ULTRASOUND 20GR AQUASONIC (MISCELLANEOUS) IMPLANT
GLOVE BIO SURGEON STRL SZ 6 (GLOVE) ×6 IMPLANT
GLOVE INDICATOR 6.5 STRL GRN (GLOVE) ×6 IMPLANT
GOWN STRL REUS W/ TWL LRG LVL3 (GOWN DISPOSABLE) ×8 IMPLANT
GOWN STRL REUS W/TWL 2XL LVL3 (GOWN DISPOSABLE) ×6 IMPLANT
GOWN STRL REUS W/TWL LRG LVL3 (GOWN DISPOSABLE) ×12
HANDLE SUCTION POOLE (INSTRUMENTS) ×2 IMPLANT
KIT BASIN OR (CUSTOM PROCEDURE TRAY) ×3 IMPLANT
KIT SIGMOIDOSCOPE (SET/KITS/TRAYS/PACK) IMPLANT
KIT TURNOVER KIT B (KITS) ×3 IMPLANT
L-HOOK LAP DISP 36CM (ELECTROSURGICAL) ×3
LEGGING LITHOTOMY PAIR STRL (DRAPES) IMPLANT
LHOOK LAP DISP 36CM (ELECTROSURGICAL) ×2 IMPLANT
LIGASURE IMPACT 36 18CM CVD LR (INSTRUMENTS) IMPLANT
LIGASURE LAP MARYLAND 5MM 37CM (ELECTROSURGICAL) ×1 IMPLANT
NS IRRIG 1000ML POUR BTL (IV SOLUTION) ×6 IMPLANT
PAD ARMBOARD 7.5X6 YLW CONV (MISCELLANEOUS) ×6 IMPLANT
PENCIL BUTTON HOLSTER BLD 10FT (ELECTRODE) ×6 IMPLANT
RELOAD PROXIMATE 75MM BLUE (ENDOMECHANICALS) ×6 IMPLANT
RELOAD STAPLE 75 3.8 BLU REG (ENDOMECHANICALS) IMPLANT
RETRACTOR WND ALEXIS 18 MED (MISCELLANEOUS) IMPLANT
RTRCTR WOUND ALEXIS 18CM MED (MISCELLANEOUS)
SCISSORS LAP 5X35 DISP (ENDOMECHANICALS) ×3 IMPLANT
SEALER TISSUE G2 STRG ARTC 35C (ENDOMECHANICALS) ×3 IMPLANT
SET IRRIG TUBING LAPAROSCOPIC (IRRIGATION / IRRIGATOR) ×3 IMPLANT
SET TUBE SMOKE EVAC HIGH FLOW (TUBING) ×3 IMPLANT
SLEEVE ENDOPATH XCEL 5M (ENDOMECHANICALS) ×3 IMPLANT
SPECIMEN JAR LARGE (MISCELLANEOUS) ×3 IMPLANT
SPONGE T-LAP 18X18 ~~LOC~~+RFID (SPONGE) IMPLANT
STAPLER PROXIMATE 75MM BLUE (STAPLE) ×1 IMPLANT
STAPLER VISISTAT 35W (STAPLE) ×3 IMPLANT
SUCTION POOLE HANDLE (INSTRUMENTS) ×3
SURGILUBE 2OZ TUBE FLIPTOP (MISCELLANEOUS) IMPLANT
SUT MNCRL AB 4-0 PS2 18 (SUTURE) ×2 IMPLANT
SUT PDS AB 1 CT  36 (SUTURE) ×6
SUT PDS AB 1 CT 36 (SUTURE) IMPLANT
SUT PROLENE 2 0 CT2 30 (SUTURE) IMPLANT
SUT PROLENE 2 0 KS (SUTURE) IMPLANT
SUT SILK 2 0 SH CR/8 (SUTURE) ×1 IMPLANT
SUT SILK 3 0 SH CR/8 (SUTURE) ×2 IMPLANT
SUT VIC AB 2-0 SH 18 (SUTURE) ×3 IMPLANT
SUT VIC AB 3-0 SH 18 (SUTURE) ×4 IMPLANT
SUT VIC AB 3-0 SH 27 (SUTURE) ×3
SUT VIC AB 3-0 SH 27X BRD (SUTURE) IMPLANT
SYR BULB IRRIG 60ML STRL (SYRINGE) ×3 IMPLANT
SYS LAPSCP GELPORT 120MM (MISCELLANEOUS)
SYSTEM LAPSCP GELPORT 120MM (MISCELLANEOUS) IMPLANT
TOWEL GREEN STERILE (TOWEL DISPOSABLE) ×6 IMPLANT
TRAY FOLEY MTR SLVR 16FR STAT (SET/KITS/TRAYS/PACK) ×3 IMPLANT
TRAY LAPAROSCOPIC MC (CUSTOM PROCEDURE TRAY) ×3 IMPLANT
TROCAR XCEL 12X100 BLDLESS (ENDOMECHANICALS) IMPLANT
TROCAR XCEL BLUNT TIP 100MML (ENDOMECHANICALS) ×1 IMPLANT
TROCAR XCEL NON-BLD 11X100MML (ENDOMECHANICALS) IMPLANT
TROCAR XCEL NON-BLD 5MMX100MML (ENDOMECHANICALS) ×4 IMPLANT
TUBE CONNECTING 12X1/4 (SUCTIONS) ×6 IMPLANT
TUBING EVAC SMOKE HEATED PNEUM (TUBING) ×3 IMPLANT
WARMER LAPAROSCOPE (MISCELLANEOUS) ×3 IMPLANT
WATER STERILE IRR 1000ML POUR (IV SOLUTION) ×3 IMPLANT
YANKAUER SUCT BULB TIP NO VENT (SUCTIONS) ×6 IMPLANT

## 2022-04-12 NOTE — Transfer of Care (Signed)
Immediate Anesthesia Transfer of Care Note  Patient: Shelby Warren  Procedure(s) Performed: LAPAROSCOPIC RIGHT HEMI COLECTOMY (Abdomen)  Patient Location: PACU  Anesthesia Type:General  Level of Consciousness: awake and drowsy  Airway & Oxygen Therapy: Patient Spontanous Breathing  Post-op Assessment: Report given to RN and Post -op Vital signs reviewed and stable  Post vital signs: Reviewed and stable  Last Vitals:  Vitals Value Taken Time  BP 106/64 04/12/22 1355  Temp    Pulse 68 04/12/22 1356  Resp 17 04/12/22 1356  SpO2 97 % 04/12/22 1356  Vitals shown include unvalidated device data.  Last Pain:  Vitals:   04/12/22 1031  TempSrc: Oral  PainSc: 0-No pain         Complications: No notable events documented.

## 2022-04-12 NOTE — Op Note (Signed)
Date: 04/12/22  Patient: Shelby Warren MRN: [id number]  Preoperative Diagnosis: Cecal mass Postoperative Diagnosis: Same  Procedure: Laparoscopic-assisted right hemicolectomy with ileocolic anastomosis, liver biopsy  Surgeon: Michaelle Birks, MD Assistant: Barkley Boards, PA-C  EBL: Minimal  Anesthesia: General endotracheal  Specimens:  Right liver nodule Right colon, terminal ileum and appendix  Indications: Shelby Warren is a 56 yo female who presented to the ED with acute right lower quadrant abdominal pain. A CT scan was consistent with acute appendicitis, secondary to a large mass in the cecum causing obstruction of the appendix. There was no radiographic evidence of metastatic disease within the abdomen. The patient was admitted, and brought to the OR today for right hemicolectomy.  Findings: Firm infiltrative mass at the base of the appendix consistent with malignancy. Small subcentimeter right liver nodule, no other evidence of metastatic disease within the abdomen.  Procedure details: Informed consent was obtained in the preoperative area prior to the procedure. The patient was brought to the operating room and placed on the table in the supine position. General anesthesia was induced and appropriate lines and drains were placed for intraoperative monitoring. Perioperative antibiotics were administered per SCIP guidelines. The abdomen was prepped and draped in the usual sterile fashion. A pre-procedure timeout was taken verifying patient identity, surgical site and procedure to be performed.  A small vertical incision was made at the umbilicus and the subcutaneous tissue was divided with cautery to expose the fascia.  The fascia was elevated and opened at the linea alba, the peritoneum was opened and the peritoneal cavity was entered.  A small Alexis wound protector was placed, and the cap with a 12 mm port was placed on the wound protector.  The abdomen was insufflated.  During initial  inspection of the abdomen no abnormalities were noted on the surface of the liver and there were no peritoneal nodules.  Additional 5 mm ports were placed in the right lower quadrant, left lower quadrant, and suprapubic area, all under direct visualization.  The cecum and appendix were identified, and there was an infiltrative mass in the cecum involving the base of the appendix.  The right colon was mobilized in a lateral to medial fashion along the line of Toldt using Enseal.  At this point a small subcentimeter nodule was noted on the right lateral surface of the liver, which was most likely benign but a metastatic lesion could not be excluded.  This lesion was sharply wedged out and sent for routine pathology.  No other nodules were noted on the surface of the liver.  Hemostasis was achieved at the biopsy site using cautery.    Next, the hepatic flexure of the colon was mobilized up to the mid-transverse colon, taking care not to injure the duodenum.  This allowed enough mobility of the colon to perform an extracorporeal anastomosis.  The cap on the umbilical wound protector was removed and the abdomen was desufflated.  The right colon and terminal ileum were exteriorized.  The middle colic vessels were identified.  A mesenteric window was created in the transverse colon proximal to the middle colic vessels, taking the right branch of the middle colic artery with the planned specimen.  The colon was cleared of omental fat at this point, and then divided with a 75 mm GIA stapler with a blue load.  The ileocolic pedicle was palpated and there were multiple enlarged lymph nodes along the pedicle.  A mesenteric window was created at the terminal ileum approximately 15 cm  proximal to the ileocecal valve.  The ileum was divided with a 75 mm GIA stapler with a blue load.  The intervening mesentery was divided with the Enseal and the ileocolic pedicle was isolated.  A high ligation of the ileocolic vessels was  performed using a 2-0 silk suture ligature, taking all the adjacent lymph nodes with the specimen.  The right colon, terminal ileum, and appendix were then passed off the field and sent for routine pathology.  The stapled end of the ileum was laid adjacent to the end of the transverse colon to create an antimesenteric side-to-side ileocolic anastomosis.  A 3-0 silk stay suture was placed, and an enterotomy and colotomy were created using cautery.  A 83m GIA stapler with a blue load was used to create a side-to-side anastomosis.  The common enterotomy was closed in 2 layers with an inner layer of 3-0 Vicryl suture and an outer layer of 3-0 silk Lembert sutures.  The anastomosis was palpated and was widely patent at the completion.  The mesenteric defect was closed with 3-0 silk figure-of-eight sutures.  The wound was irrigated with saline.  The bowel was placed back into the abdomen and the cap was replaced on the wound protector.  The abdomen was insufflated and again examined laparoscopically.  There was no evidence of active bleeding and the liver biopsy site appeared hemostatic.  The anastomosis was laying in proper orientation with no twisting of the mesentery.  The ports were removed under direct visualization and the abdomen was desufflated.  The wound protector was removed.  New sterile drapes were applied, a new sterile closing tray was opened, and all team members changed gowns and gloves per the colorectal ERAS protocol.  The fascia was closed at the umbilical wound using a running 1 PDS suture.  Scarpa's layer was closed with a running 3-0 Vicryl suture.  The skin at all port sites and the umbilical incision was closed with subcuticular 4-0 Monocryl suture.  Dermabond was applied.  The patient tolerated the procedure well with no apparent complications. All counts were correct x2 at the end of the procedure. The patient was extubated and taken to PACU in stable condition.  SMichaelle Birks  MD 04/12/22 1:52 PM

## 2022-04-12 NOTE — Progress Notes (Signed)
Day of Surgery  Subjective: Pain improved, afebrile. Tolerated bowel prep.   Objective: Vital signs in last 24 hours: Temp:  [97.6 F (36.4 C)-99 F (37.2 C)] 98.5 F (36.9 C) (07/13 0745) Pulse Rate:  [73-94] 81 (07/13 0745) Resp:  [17-18] 17 (07/13 0745) BP: (112-140)/(67-83) 114/76 (07/13 0745) SpO2:  [98 %-100 %] 100 % (07/13 0745) Last BM Date : 04/10/22  Intake/Output from previous day: 07/12 0701 - 07/13 0700 In: 2504.8 [P.O.:870; I.V.:100; IV Piggyback:1534.8] Out: -  Intake/Output this shift: No intake/output data recorded.  PE: General: resting comfortably, NAD Neuro: alert and oriented, no focal deficits Resp: normal work of breathing on room air Abdomen: soft, nondistended, nontender to palpation. Extremities: warm and well-perfused   Lab Results:  Recent Labs    04/11/22 0701 04/12/22 0454  WBC 13.1* 7.3  HGB 10.7* 10.3*  HCT 32.4* 33.0*  PLT 403* 398   BMET Recent Labs    04/11/22 0701 04/12/22 0454  NA 138 140  K 4.0 4.0  CL 104 105  CO2 27 27  GLUCOSE 115* 106*  BUN 13 10  CREATININE 0.79 0.89  CALCIUM 9.1 8.9   PT/INR Recent Labs    04/11/22 0810  LABPROT 13.4  INR 1.0   CMP     Component Value Date/Time   NA 140 04/12/2022 0454   K 4.0 04/12/2022 0454   CL 105 04/12/2022 0454   CO2 27 04/12/2022 0454   GLUCOSE 106 (H) 04/12/2022 0454   BUN 10 04/12/2022 0454   CREATININE 0.89 04/12/2022 0454   CALCIUM 8.9 04/12/2022 0454   PROT 6.7 04/11/2022 0701   ALBUMIN 3.5 04/11/2022 0701   AST 17 04/11/2022 0701   ALT 18 04/11/2022 0701   ALKPHOS 52 04/11/2022 0701   BILITOT 0.4 04/11/2022 0701   GFRNONAA >60 04/12/2022 0454   Lipase     Component Value Date/Time   LIPASE 22 04/11/2022 0714       Studies/Results: CT ABDOMEN PELVIS W CONTRAST  Result Date: 04/11/2022 CLINICAL DATA:  56 year old female history of acute onset of nonlocalized abdominal pain. EXAM: CT ABDOMEN AND PELVIS WITH CONTRAST TECHNIQUE:  Multidetector CT imaging of the abdomen and pelvis was performed using the standard protocol following bolus administration of intravenous contrast. RADIATION DOSE REDUCTION: This exam was performed according to the departmental dose-optimization program which includes automated exposure control, adjustment of the mA and/or kV according to patient size and/or use of iterative reconstruction technique. CONTRAST:  141m OMNIPAQUE IOHEXOL 300 MG/ML  SOLN COMPARISON:  CT the abdomen and pelvis 10/25/2011. FINDINGS: Lower chest: Unremarkable. Hepatobiliary: No suspicious cystic or solid hepatic lesions. No intra or extrahepatic biliary ductal dilatation. Gallbladder is unremarkable in appearance. Pancreas: No pancreatic mass. No pancreatic ductal dilatation. No pancreatic or peripancreatic fluid collections or inflammatory changes. Spleen: Unremarkable. Adrenals/Urinary Tract: Bilateral kidneys and bilateral adrenal glands are normal in appearance. No hydroureteronephrosis. Urinary bladder is unremarkable in appearance. Stomach/Bowel: The appearance of the stomach is normal. No pathologic dilatation of small bowel or colon. Mass-like mural thickening is noted in the region of the cecum with surrounding haziness in the pericecal fat. Adjacent to this the appendix is dilated and appears inflamed, concerning for concurrent acute appendicitis. Appendix: Location: Inferior to the cecum Diameter: 17 mm Appendicolith: None Mucosal hyper-enhancement: Present Extraluminal gas: None Periappendiceal collection: Periappendiceal phlegmonous inflammation without discrete periappendiceal abscess. Vascular/Lymphatic: No significant atherosclerotic disease, aneurysm or dissection noted in the abdominal or pelvic vasculature. Numerous prominent borderline enlarged and  mildly enlarged ileocolic lymph nodes are noted measuring up to 1 cm in short axis Reproductive: Status post hysterectomy. Ovaries are unremarkable in appearance. Other:  Trace volume of ascites in the low anatomic pelvis. No pneumoperitoneum. Musculoskeletal: There are no aggressive appearing lytic or blastic lesions noted in the visualized portions of the skeleton. IMPRESSION: 1. Findings are compatible with acute appendicitis. The cause of the acute appendicitis appears to be an obstructing malignant appearing acecal mass at the ostium of the appendix. Notably, there is haziness surrounding both the appendix (presumably from acute inflammation) and the cecum (potentially from locally infiltrative malignancy), along with lymphadenopathy in the ileocolic mesentery concerning for nodal metastatic disease. Surgical consultation is strongly recommended. Electronically Signed   By: Vinnie Langton M.D.   On: 04/11/2022 10:12    Anti-infectives: Anti-infectives (From admission, onward)    Start     Dose/Rate Route Frequency Ordered Stop   04/12/22 0600  ertapenem (INVANZ) 1,000 mg in sodium chloride 0.9 % 100 mL IVPB  Status:  Discontinued        1 g 200 mL/hr over 30 Minutes Intravenous On call to O.R. 04/11/22 1509 04/11/22 1638   04/11/22 1600  ciprofloxacin (CIPRO) IVPB 400 mg        400 mg 200 mL/hr over 60 Minutes Intravenous Every 12 hours 04/11/22 1503     04/11/22 1600  metroNIDAZOLE (FLAGYL) IVPB 500 mg        500 mg 100 mL/hr over 60 Minutes Intravenous Every 6 hours 04/11/22 1503     04/11/22 1115  cefTRIAXone (ROCEPHIN) 2 g in sodium chloride 0.9 % 100 mL IVPB  Status:  Discontinued       See Hyperspace for full Linked Orders Report.   2 g 200 mL/hr over 30 Minutes Intravenous Every 24 hours 04/11/22 1101 04/11/22 1503   04/11/22 1115  metroNIDAZOLE (FLAGYL) IVPB 500 mg  Status:  Discontinued       See Hyperspace for full Linked Orders Report.   500 mg 100 mL/hr over 60 Minutes Intravenous Every 12 hours 04/11/22 1101 04/11/22 1503        Assessment/Plan 56 yo female presenting with acute appendicitis secondary to a cecal mass, likely  malignant. - OR today for lap-assisted, possible open, right hemicolectomy. Procedure details reviewed, informed consent obtained and placed on chart. - Bowel prep completed - Continue Rocephin/flagyl - NPO for surgery, gentle IV fluid hydration - VTE: lovenox, SCDs - Dispo: inpatient, med-surg floor     LOS: 1 day    Michaelle Birks, MD [facility] Surgery General, Hepatobiliary and Pancreatic Surgery 04/12/22 8:31 AM

## 2022-04-12 NOTE — Anesthesia Procedure Notes (Signed)
Procedure Name: Intubation Date/Time: 04/12/2022 11:19 AM  Performed by: Colin Benton, CRNAPre-anesthesia Checklist: Patient identified, Emergency Drugs available, Suction available and Patient being monitored Patient Re-evaluated:Patient Re-evaluated prior to induction Oxygen Delivery Method: Circle system utilized Preoxygenation: Pre-oxygenation with 100% oxygen Induction Type: IV induction Ventilation: Mask ventilation without difficulty Laryngoscope Size: Miller and 2 Grade View: Grade I Tube type: Oral Tube size: 7.0 mm Number of attempts: 1 Airway Equipment and Method: Stylet and Oral airway Placement Confirmation: ETT inserted through vocal cords under direct vision, positive ETCO2 and breath sounds checked- equal and bilateral Secured at: 21 cm Tube secured with: Tape Dental Injury: Teeth and Oropharynx as per pre-operative assessment

## 2022-04-12 NOTE — Anesthesia Postprocedure Evaluation (Signed)
Anesthesia Post Note  Patient: Shelby Warren  Procedure(s) Performed: LAPAROSCOPIC RIGHT HEMI COLECTOMY AND TERMINAL ILEUM (Abdomen) LIVER BIOPSY (Right: Abdomen) APPENDECTOMY (Abdomen)     Patient location during evaluation: PACU Anesthesia Type: General Level of consciousness: awake and alert Pain management: pain level controlled Vital Signs Assessment: post-procedure vital signs reviewed and stable Respiratory status: spontaneous breathing, nonlabored ventilation, respiratory function stable and patient connected to nasal cannula oxygen Cardiovascular status: blood pressure returned to baseline and stable Postop Assessment: no apparent nausea or vomiting Anesthetic complications: no   No notable events documented.  Last Vitals:  Vitals:   04/12/22 1430 04/12/22 1500  BP: (!) 144/80 (!) 145/81  Pulse: 63 76  Resp: 10 18  Temp:  37.2 C  SpO2: 100% 100%    Last Pain:  Vitals:   04/12/22 1500  TempSrc: Oral  PainSc: 0-No pain                 Barnet Glasgow

## 2022-04-13 ENCOUNTER — Encounter (HOSPITAL_COMMUNITY): Payer: Self-pay | Admitting: Surgery

## 2022-04-13 ENCOUNTER — Inpatient Hospital Stay (HOSPITAL_COMMUNITY): Payer: Commercial Managed Care - PPO

## 2022-04-13 LAB — CBC
HCT: 29.2 % — ABNORMAL LOW (ref 36.0–46.0)
Hemoglobin: 9.5 g/dL — ABNORMAL LOW (ref 12.0–15.0)
MCH: 29.1 pg (ref 26.0–34.0)
MCHC: 32.5 g/dL (ref 30.0–36.0)
MCV: 89.3 fL (ref 80.0–100.0)
Platelets: 331 10*3/uL (ref 150–400)
RBC: 3.27 MIL/uL — ABNORMAL LOW (ref 3.87–5.11)
RDW: 13.1 % (ref 11.5–15.5)
WBC: 10.2 10*3/uL (ref 4.0–10.5)
nRBC: 0 % (ref 0.0–0.2)

## 2022-04-13 LAB — BASIC METABOLIC PANEL
Anion gap: 13 (ref 5–15)
BUN: 6 mg/dL (ref 6–20)
CO2: 23 mmol/L (ref 22–32)
Calcium: 8.5 mg/dL — ABNORMAL LOW (ref 8.9–10.3)
Chloride: 106 mmol/L (ref 98–111)
Creatinine, Ser: 0.78 mg/dL (ref 0.44–1.00)
GFR, Estimated: >60 mL/min (ref >60)
Glucose, Bld: 101 mg/dL — ABNORMAL HIGH (ref 70–99)
Potassium: 3.8 mmol/L (ref 3.5–5.1)
Sodium: 142 mmol/L (ref 135–145)

## 2022-04-13 LAB — CEA: CEA: 22 ng/mL — ABNORMAL HIGH (ref 0.0–4.7)

## 2022-04-13 MED ORDER — UMECLIDINIUM BROMIDE 62.5 MCG/ACT IN AEPB
1.0000 | INHALATION_SPRAY | Freq: Every day | RESPIRATORY_TRACT | Status: DC
  Filled 2022-04-13: qty 7

## 2022-04-13 MED ORDER — FLUTICASONE FUROATE-VILANTEROL 100-25 MCG/ACT IN AEPB
1.0000 | INHALATION_SPRAY | Freq: Every day | RESPIRATORY_TRACT | Status: DC
  Filled 2022-04-13: qty 28

## 2022-04-13 NOTE — Progress Notes (Addendum)
1 Day Post-Op  Subjective: No acute postop issues. Having some pain but it is controlled with oral medications. Good UOP.   Objective: Vital signs in last 24 hours: Temp:  [97.2 F (36.2 C)-98.9 F (37.2 C)] 98.5 F (36.9 C) (07/14 0549) Pulse Rate:  [62-97] 91 (07/14 0549) Resp:  [10-24] 18 (07/14 0549) BP: (94-149)/(64-91) 126/79 (07/14 0549) SpO2:  [97 %-100 %] 98 % (07/14 0549) Weight:  [73 kg] 73 kg (07/14 0500) Last BM Date : 07/Shelby/23  Intake/Output from previous day: 07/Shelby 0701 - 07/14 0700 In: 1909.2 [I.V.:1000; IV Piggyback:909.2] Out: 1910 [Urine:1880; Blood:30] Intake/Output this shift: No intake/output data recorded.  PE: General: resting comfortably, NAD Neuro: alert and oriented, no focal deficits Resp: normal work of breathing on room air Abdomen: soft, nondistended, appropriately tender. Incisions clean and dry with mild ecchymosis at umbilical incision. Extremities: warm and well-perfused GU: foley draining clear yellow urine   Lab Results:  Recent Labs    07/Shelby/23 0454 04/13/22 0131  WBC 7.3 10.2  HGB 10.3* 9.5*  HCT 33.0* 29.2*  PLT 398 331   BMET Recent Labs    07/Shelby/23 0454 04/13/22 0131  NA 140 142  K 4.0 3.8  CL 105 106  CO2 27 23  GLUCOSE 106* 101*  BUN 10 6  CREATININE 0.89 0.78  CALCIUM 8.9 8.5*   PT/INR Recent Labs    04/11/22 0810  LABPROT Shelby.4  INR 1.0   CMP     Component Value Date/Time   NA 142 04/13/2022 0131   K 3.8 04/13/2022 0131   CL 106 04/13/2022 0131   CO2 23 04/13/2022 0131   GLUCOSE 101 (H) 04/13/2022 0131   BUN 6 04/13/2022 0131   CREATININE 0.78 04/13/2022 0131   CALCIUM 8.5 (L) 04/13/2022 0131   PROT 6.7 04/11/2022 0701   ALBUMIN 3.5 04/11/2022 0701   AST 17 04/11/2022 0701   ALT 18 04/11/2022 0701   ALKPHOS 52 04/11/2022 0701   BILITOT 0.4 04/11/2022 0701   GFRNONAA >60 04/13/2022 0131   Lipase     Component Value Date/Time   LIPASE 22 04/11/2022 0714        Studies/Results: CT ABDOMEN PELVIS W CONTRAST  Result Date: 04/11/2022 CLINICAL DATA:  56 year old female history of acute onset of nonlocalized abdominal pain. EXAM: CT ABDOMEN AND PELVIS WITH CONTRAST TECHNIQUE: Multidetector CT imaging of the abdomen and pelvis was performed using the standard protocol following bolus administration of intravenous contrast. RADIATION DOSE REDUCTION: This exam was performed according to the departmental dose-optimization program which includes automated exposure control, adjustment of the mA and/or kV according to patient size and/or use of iterative reconstruction technique. CONTRAST:  175m OMNIPAQUE IOHEXOL 300 MG/ML  SOLN COMPARISON:  CT the abdomen and pelvis 10/25/2011. FINDINGS: Lower chest: Unremarkable. Hepatobiliary: No suspicious cystic or solid hepatic lesions. No intra or extrahepatic biliary ductal dilatation. Gallbladder is unremarkable in appearance. Pancreas: No pancreatic Warren. No pancreatic ductal dilatation. No pancreatic or peripancreatic fluid collections or inflammatory changes. Spleen: Unremarkable. Adrenals/Urinary Tract: Bilateral kidneys and bilateral adrenal glands are normal in appearance. No hydroureteronephrosis. Urinary bladder is unremarkable in appearance. Stomach/Bowel: The appearance of the stomach is normal. No pathologic dilatation of small bowel or colon. Warren-like mural thickening is noted in the region of the cecum with surrounding haziness in the pericecal fat. Adjacent to this the appendix is dilated and appears inflamed, concerning for concurrent acute appendicitis. Appendix: Location: Inferior to the cecum Diameter: 17 mm Appendicolith: None Mucosal  hyper-enhancement: Present Extraluminal gas: None Periappendiceal collection: Periappendiceal phlegmonous inflammation without discrete periappendiceal abscess. Vascular/Lymphatic: No significant atherosclerotic disease, aneurysm or dissection noted in the abdominal or pelvic  vasculature. Numerous prominent borderline enlarged and mildly enlarged ileocolic lymph nodes are noted measuring up to 1 cm in short axis Reproductive: Status post hysterectomy. Ovaries are unremarkable in appearance. Other: Trace volume of ascites in the low anatomic pelvis. No pneumoperitoneum. Musculoskeletal: There are no aggressive appearing lytic or blastic lesions noted in the visualized portions of the skeleton. IMPRESSION: 1. Findings are compatible with acute appendicitis. The cause of the acute appendicitis appears to be an obstructing malignant appearing acecal Warren at the ostium of the appendix. Notably, there is haziness surrounding both the appendix (presumably from acute inflammation) and the cecum (potentially from locally infiltrative malignancy), along with lymphadenopathy in the ileocolic mesentery concerning for nodal metastatic disease. Surgical consultation is strongly recommended. Electronically Signed   By: Vinnie Langton M.D.   On: 04/11/2022 10:12    Anti-infectives: Anti-infectives (From admission, onward)    Start     Dose/Rate Route Frequency Ordered Stop   07/Shelby/23 0600  ertapenem (INVANZ) 1,000 mg in sodium chloride 0.9 % 100 mL IVPB  Status:  Discontinued        1 g 200 mL/hr over 30 Minutes Intravenous On call to O.R. 04/11/22 1509 04/11/22 1638   04/11/22 1600  ciprofloxacin (CIPRO) IVPB 400 mg        400 mg 200 mL/hr over 60 Minutes Intravenous Every 12 hours 04/11/22 1503     04/11/22 1600  metroNIDAZOLE (FLAGYL) IVPB 500 mg        500 mg 100 mL/hr over 60 Minutes Intravenous Every 6 hours 04/11/22 1503     04/11/22 1115  cefTRIAXone (ROCEPHIN) 2 g in sodium chloride 0.9 % 100 mL IVPB  Status:  Discontinued       See Hyperspace for full Linked Orders Report.   2 g 200 mL/hr over 30 Minutes Intravenous Every 24 hours 04/11/22 1101 04/11/22 1503   04/11/22 1115  metroNIDAZOLE (FLAGYL) IVPB 500 mg  Status:  Discontinued       See Hyperspace for full Linked  Orders Report.   500 mg 100 mL/hr over 60 Minutes Intravenous Every 12 hours 04/11/22 1101 04/11/22 1503        Assessment/Plan 56 yo female presenting with acute appendicitis secondary to a cecal Warren, Shelby Warren, Shelby Warren, Shelby Warren - Dispo: inpatient, med-surg floor     LOS: 2 days    Michaelle Birks, MD [facility] Surgery General, Hepatobiliary and Pancreatic Surgery 04/13/22 8:54 AM

## 2022-04-13 NOTE — Evaluation (Signed)
Physical Therapy Evaluation Patient Details Name: SERRIA SLOMA MRN: [id number] DOB: 06/03/1966 Today's Date: 04/13/2022  History of Present Illness  Pt is a 56 yo female who presented to the ED with acute right lower quadrant abdominal pain. A CT scan was consistent with acute appendicitis, secondary to a large mass in the cecum causing obstruction of the appendix. There was no radiographic evidence of metastatic disease within the abdomen. Pt is now s/p laparoscopic-assisted right hemicolectomy with ileocolic anastomosis, liver biopsy.  Clinical Impression  Pt agreeable to physical therapy evaluation/treatment session. Pt performing bed mobility, transfers, and gait at independent level. Pt able to perform higher level exercises with minimal to no difficulty (cues required due to knee valgus during chair squats). Pt does not require any further skilled, acute care physical therapy interventions.     Recommendations for follow up therapy are one component of a multi-disciplinary discharge planning process, led by the attending physician.  Recommendations may be updated based on patient status, additional functional criteria and insurance authorization.  Follow Up Recommendations No PT follow up      Assistance Recommended at Discharge PRN  Patient can return home with the following  Assist for transportation    Equipment Recommendations None recommended by PT  Recommendations for Other Services       Functional Status Assessment Patient has had a recent decline in their functional status and demonstrates the ability to make significant improvements in function in a reasonable and predictable amount of time.     Precautions / Restrictions Precautions Precautions: Other (comment) (abdominal) Restrictions Weight Bearing Restrictions: No      Mobility  Bed Mobility Overal bed mobility: Independent             General bed mobility comments: Pt educated on log roll technique to  utilize as needed.    Transfers Overall transfer level: Independent Equipment used: None                    Ambulation/Gait Ambulation/Gait assistance: Independent Gait Distance (Feet): 650 Feet Assistive device: None Gait Pattern/deviations: WFL(Within Functional Limits), Step-through pattern   Gait velocity interpretation: >4.37 ft/sec, indicative of normal walking speed   General Gait Details: Good endurance present. O2 sats stable.  Stairs            Wheelchair Mobility    Modified Rankin (Stroke Patients Only)       Balance Overall balance assessment: Independent                                           Pertinent Vitals/Pain Pain Assessment Pain Assessment: 0-10 Pain Score: 5  (no pain at rest) Pain Location: RLQ abdomen Pain Descriptors / Indicators: Sore Pain Intervention(s): Limited activity within patient's tolerance, Monitored during session, Ice applied, Premedicated before session    Home Living Family/patient expects to be discharged to:: Private residence Living Arrangements: Spouse/significant other Available Help at Discharge: Family;Available 24 hours/day (pt's husband retired) Type of Home: House Home Access: Stairs to enter Entrance Stairs-Rails: None Technical brewer of Steps: 1 Alternate Level Stairs-Number of Steps: 14 Home Layout: Two level;Able to live on main level with bedroom/bathroom Home Equipment: Crutches;Shower seat - built in      Prior Function Prior Level of Function : Independent/Modified Independent;Working/employed;Driving             Mobility Comments: Does not use  AD. ADLs Comments: Independent with ADL's and IADL's. Pt walks for exercise 5 days per week (2.5 miles each time).     Hand Dominance   Dominant Hand: Right    Extremity/Trunk Assessment   Upper Extremity Assessment Upper Extremity Assessment: Overall WFL for tasks assessed    Lower Extremity  Assessment Lower Extremity Assessment: Overall WFL for tasks assessed    Cervical / Trunk Assessment Cervical / Trunk Assessment: Normal  Communication   Communication: No difficulties  Cognition Arousal/Alertness: Awake/alert Behavior During Therapy: WFL for tasks assessed/performed Overall Cognitive Status: Within Functional Limits for tasks assessed                                          General Comments      Exercises Other Exercises Other Exercises: Pt instructed in and performed the following exercises: standing bilateral heel raises x 12 with UE support, standing SLR's x 12 ea with UE support, standing hip abduction x 12 ea with UE support, squats from chair x 8 reps, and SLS x 20 seconds each leg.   Assessment/Plan    PT Assessment Patient does not need any further PT services         PT Goals (Current goals can be found in the Care Plan section)  Acute Rehab PT Goals Patient Stated Goal: none stated     AM-PAC PT "6 Clicks" Mobility  Outcome Measure Help needed turning from your back to your side while in a flat bed without using bedrails?: None Help needed moving from lying on your back to sitting on the side of a flat bed without using bedrails?: None Help needed moving to and from a bed to a chair (including a wheelchair)?: None Help needed standing up from a chair using your arms (e.g., wheelchair or bedside chair)?: None Help needed to walk in hospital room?: None Help needed climbing 3-5 steps with a railing? : None 6 Click Score: 24    End of Session Equipment Utilized During Treatment:  (none) Activity Tolerance: Patient tolerated treatment well Patient left: in chair Nurse Communication: Mobility status PT Visit Diagnosis: Pain    Time: 5053-9767 PT Time Calculation (min) (ACUTE ONLY): 25 min   Charges:   PT Evaluation $PT Eval Low Complexity: 1 Low PT Treatments $Therapeutic Exercise: 8-22 mins        Donna Bernard, PT    [facility] 04/13/2022, 3:40 PM

## 2022-04-13 NOTE — TOC CM/SW Note (Signed)
  Transition of Care [facility]) Screening Note   Patient Details  Name: Shelby Warren Date of Birth: [birth year]   Patient off unit currently. WIll continue to follow for PT/OT recommendations    Transition of Care Department [facility])  will continue to monitor patient advancement through interdisciplinary progression rounds. If new patient transition needs arise, please place a TOC consult.

## 2022-04-13 NOTE — Progress Notes (Signed)
OT Cancellation Note  Patient Details Name: Shelby Warren MRN: [id number] DOB: 03-Jun-1966   Cancelled Treatment:    Reason Eval/Treat Not Completed: Patient at procedure or test/ unavailable. Pt currently being transported for CT. Plan to reattempt.  [name], OT Acute Rehabilitation Services Office: [phone]   [name] 04/13/2022, 11:03 AM

## 2022-04-13 NOTE — Plan of Care (Signed)
  Problem: Education: Goal: Knowledge of General Education information will improve Description Including pain rating scale, medication(s)/side effects and non-pharmacologic comfort measures Outcome: Progressing   Problem: Health Behavior/Discharge Planning: Goal: Ability to manage health-related needs will improve Outcome: Progressing   

## 2022-04-14 ENCOUNTER — Encounter: Payer: Self-pay | Admitting: Dermatology

## 2022-04-14 NOTE — Plan of Care (Signed)
  Problem: Clinical Measurements: Goal: Will remain free from infection Outcome: Progressing Goal: Respiratory complications will improve Outcome: Progressing   Problem: Activity: Goal: Risk for activity intolerance will decrease Outcome: Progressing   Problem: Nutrition: Goal: Adequate nutrition will be maintained Outcome: Progressing   Problem: Pain Managment: Goal: General experience of comfort will improve Outcome: Progressing   Problem: Safety: Goal: Ability to remain free from injury will improve Outcome: Progressing

## 2022-04-14 NOTE — Evaluation (Signed)
Occupational Therapy Evaluation and Discharge Patient Details Name: Shelby Warren MRN: [id number] DOB: [birth year] Today's Date: 04/14/2022   History of Present Illness Pt is a 56 yo female who presented to the ED with acute right lower quadrant abdominal pain. A CT scan was consistent with acute appendicitis, secondary to a large mass in the cecum causing obstruction of the appendix. There was no radiographic evidence of metastatic disease within the abdomen. Pt is now s/p laparoscopic-assisted right hemicolectomy with ileocolic anastomosis, liver biopsy.   Clinical Impression   Pt lives with spouse, independent at baseline with ADLs and functional mobility, currently mod I -Ind for all ADLs and aspects of mobility without use of AD. Pt reporting mod abdominal pain, however is able to complete seated/standing ADLs without difficulty. Reviewed abd precautions with pt and use of log rolling technique for bed mobility. Pt presenting with impairments listed below, however has no acute OT needs at this time, will s/o. Please reconsult if there is a change in pt status. Recommend d/c home with family assistance.     Recommendations for follow up therapy are one component of a multi-disciplinary discharge planning process, led by the attending physician.  Recommendations may be updated based on patient status, additional functional criteria and insurance authorization.   Follow Up Recommendations  No OT follow up    Assistance Recommended at Discharge PRN  Patient can return home with the following Assistance with cooking/housework    Functional Status Assessment  Patient has had a recent decline in their functional status and demonstrates the ability to make significant improvements in function in a reasonable and predictable amount of time.  Equipment Recommendations  None recommended by OT    Recommendations for Other Services       Precautions / Restrictions Precautions Precautions: Other  (comment) (abdominal) Restrictions Weight Bearing Restrictions: No      Mobility Bed Mobility Overal bed mobility: Independent             General bed mobility comments: reviewed log rolling technique with pt for comfort    Transfers Overall transfer level: Independent Equipment used: None                      Balance Overall balance assessment: Independent                                         ADL either performed or assessed with clinical judgement   ADL Overall ADL's : Modified independent                                       General ADL Comments: completing toileting/LB dressing and standing grooming task at sink upon arrival     Vision   Vision Assessment?: No apparent visual deficits     Perception     Praxis      Pertinent Vitals/Pain Pain Assessment Pain Assessment: Faces Pain Score: 5  Faces Pain Scale: Hurts little more Pain Location: abdomen Pain Descriptors / Indicators: Sore Pain Intervention(s): Limited activity within patient's tolerance, Monitored during session, Repositioned     Hand Dominance Right   Extremity/Trunk Assessment Upper Extremity Assessment Upper Extremity Assessment: Overall WFL for tasks assessed   Lower Extremity Assessment Lower Extremity Assessment: Overall WFL for tasks assessed   Cervical /  Trunk Assessment Cervical / Trunk Assessment: Normal   Communication Communication Communication: No difficulties   Cognition Arousal/Alertness: Awake/alert Behavior During Therapy: WFL for tasks assessed/performed Overall Cognitive Status: Within Functional Limits for tasks assessed                                       General Comments  VSS on RA, spouse in room during session    Exercises     Shoulder [place] expects to be discharged to:: Private residence Living Arrangements: Spouse/significant other Available  Help at Discharge: Family;Available 24 hours/day Type of Home: House Home Access: Stairs to enter [employer] of Steps: 1 Entrance Stairs-Rails: None Home Layout: Two level;Able to live on main level with bedroom/bathroom Alternate Level Stairs-Number of Steps: 14 Alternate Level Stairs-Rails: Right Bathroom Shower/Tub: Occupational psychologist: Standard     Home Equipment: Crutches;Shower seat - built in          Prior Functioning/Environment Prior Level of Function : Independent/Modified Independent;Working/employed;Driving             Mobility Comments: Does not use AD. ADLs Comments: Independent with ADL's and IADL's. Pt walks for exercise 5 days per week (2.5 miles each time).        OT Problem List: Decreased activity tolerance;Pain      OT Treatment/Interventions:      OT Goals(Current goals can be found in the care plan section) Acute Rehab OT Goals Patient Stated Goal: none stated OT Goal Formulation: With patient Time For Goal Achievement: 04/28/22 Potential to Achieve Goals: Good  OT Frequency:      Co-evaluation              AM-PAC OT "6 Clicks" Daily Activity     Outcome Measure Help from another person eating meals?: None Help from another person taking care of personal grooming?: None Help from another person toileting, which includes using toliet, bedpan, or urinal?: None Help from another person bathing (including washing, rinsing, drying)?: None Help from another person to put on and taking off regular upper body clothing?: None Help from another person to put on and taking off regular lower body clothing?: None 6 Click Score: 24   End of Session Nurse Communication: Mobility status  Activity Tolerance: Patient tolerated treatment well Patient left: in chair;with call bell/phone within reach;with family/visitor present  OT Visit Diagnosis: Muscle weakness (generalized) (M62.81)                Time: 2878-6767 OT  Time Calculation (min): 10 min Charges:  OT General Charges $OT Visit: 1 Visit OT Evaluation $OT Eval Low Complexity: 1 Low  Lynnda Child, OTD, OTR/L Acute Rehab [phone] - [place] 04/14/2022, 9:26 AM

## 2022-04-14 NOTE — Progress Notes (Signed)
   Follow-Up Visit   Subjective  Shelby Warren is a 56 y.o. female who presents for the following: Follow-up (Ln 2 on left cheek- didn't go away completely ).  Residual spot left cheek Location:  Duration:  Quality:  Associated Signs/Symptoms: Modifying Factors:  Severity:  Timing: Context:   Objective  Well appearing patient in no apparent distress; mood and affect are within normal limits. Left Zygomatic Area Slightly inflamed pink 5 mm flat scale    A focused examination was performed including face. Relevant physical exam findings are noted in the Assessment and Plan.   Assessment & Plan    Seborrheic keratosis, inflamed Left Zygomatic Area  Options reviewed, patient requests treatment, repeat LN2 freeze  Destruction of lesion - Left Zygomatic Area Complexity: simple   Destruction method: cryotherapy   Informed consent: discussed and consent obtained   Timeout:  patient name, date of birth, surgical site, and procedure verified Lesion destroyed using liquid nitrogen: Yes   Cryotherapy cycles:  3 Outcome: patient tolerated procedure well with no complications        I, Lavonna Monarch, MD, have reviewed all documentation for this visit.  The documentation on 04/14/22 for the exam, diagnosis, procedures, and orders are all accurate and complete.

## 2022-04-14 NOTE — Progress Notes (Signed)
Patient ID: Shelby Warren, female   DOB: 07/22/1966, 56 y.o.   MRN: [id number] [facility] Surgery Progress Note:   2 Days Post-Op  Subjective: Mental status is clear.  Complaints none. Objective: Vital signs in last 24 hours: Temp:  [98.1 F (36.7 C)-98.7 F (37.1 C)] 98.7 F (37.1 C) (07/15 0712) Pulse Rate:  [77-102] 77 (07/15 0712) Resp:  [14-20] 16 (07/15 0712) BP: (117-136)/(55-82) 119/55 (07/15 0712) SpO2:  [94 %-99 %] 94 % (07/15 0712) Weight:  [72.4 kg] 72.4 kg (07/15 0411)  Intake/Output from previous day: 07/14 0701 - 07/15 0700 In: 106.5 [I.V.:106.5] Out: 2 [Urine:1; Stool:1] Intake/Output this shift: No intake/output data recorded.  Physical Exam: Work of breathing is normal.  Abdominal incisions are covered with Dermabond-not red.  Passing flatus and taking liquids without nausea  Lab Results:  Results for orders placed or performed during the hospital encounter of 04/11/22 (from the past 48 hour(s))  Type and screen [place]     Status: None   Collection Time: 04/12/22  9:04 AM  Result Value Ref Range   ABO/RH(D) A POS    Antibody Screen NEG    Sample Expiration      04/15/2022,2359 Performed at [facility], [place] [address], [place], [place] [address]   Basic metabolic panel     Status: Abnormal   Collection Time: 04/13/22  1:31 AM  Result Value Ref Range   Sodium 142 135 - 145 mmol/L   Potassium 3.8 3.5 - 5.1 mmol/L   Chloride 106 98 - 111 mmol/L   CO2 23 22 - 32 mmol/L   Glucose, Bld 101 (H) 70 - 99 mg/dL    Comment: Glucose reference range applies only to samples taken after fasting for at least 8 hours.   BUN 6 6 - 20 mg/dL   Creatinine, Ser 0.78 0.44 - 1.00 mg/dL   Calcium 8.5 (L) 8.9 - 10.3 mg/dL   GFR, Estimated >60 >60 mL/min    Comment: (NOTE) Calculated using the CKD-EPI Creatinine Equation (2021)    Anion gap 13 5 - 15    Comment: Performed at [facility] [address], [place], [place] [address]   CBC     Status: Abnormal   Collection Time: 04/13/22  1:31 AM  Result Value Ref Range   WBC 10.2 4.0 - 10.5 K/uL   RBC 3.27 (L) 3.87 - 5.11 MIL/uL   Hemoglobin 9.5 (L) 12.0 - 15.0 g/dL   HCT 29.2 (L) 36.0 - 46.0 %   MCV 89.3 80.0 - 100.0 fL   MCH 29.1 26.0 - 34.0 pg   MCHC 32.5 30.0 - 36.0 g/dL   RDW 13.1 11.5 - 15.5 %   Platelets 331 150 - 400 K/uL   nRBC 0.0 0.0 - 0.2 %    Comment: Performed at [facility], [place] [address], [place], [place] [address]    Radiology/Results: CT CHEST WO CONTRAST  Result Date: 04/13/2022 CLINICAL DATA:  Metastatic colon cancer staging. Laparoscopic right hemicolectomy April 12, 2022. * Tracking Code: BO * EXAM: CT CHEST WITHOUT CONTRAST TECHNIQUE: Multidetector CT imaging of the chest was performed following the standard protocol without IV contrast. RADIATION DOSE REDUCTION: This exam was performed according to the departmental dose-optimization program which includes automated exposure control, adjustment of the mA and/or kV according to patient size and/or use of iterative reconstruction technique. COMPARISON:  CT abdomen 04/11/2022 FINDINGS: Cardiovascular: Unremarkable Mediastinum/Nodes: Unremarkable Lungs/Pleura: Trace bilateral pleural  effusions. These are new compared to 04/11/2022. No significant pulmonary nodule identified. Upper Abdomen: Pneumoperitoneum, not unexpected given the patient's abdominal surgery yesterday. Musculoskeletal: Mild thoracic spondylosis. IMPRESSION: 1. No findings of metastatic disease to the chest. 2. Pneumoperitoneum and trace bilateral pleural effusions, likely related to the patient's abdominal surgery yesterday. 3. Mild thoracic spondylosis. Electronically Signed   By: Van Clines M.D.   On: 04/13/2022 11:51    Anti-infectives: Anti-infectives (From admission, onward)    Start     Dose/Rate Route Frequency Ordered Stop   04/12/22 0600  ertapenem (INVANZ) 1,000 mg in sodium chloride 0.9 % 100 mL IVPB   Status:  Discontinued        1 g 200 mL/hr over 30 Minutes Intravenous On call to O.R. 04/11/22 1509 04/11/22 1638   04/11/22 1600  ciprofloxacin (CIPRO) IVPB 400 mg  Status:  Discontinued        400 mg 200 mL/hr over 60 Minutes Intravenous Every 12 hours 04/11/22 1503 04/13/22 0902   04/11/22 1600  metroNIDAZOLE (FLAGYL) IVPB 500 mg  Status:  Discontinued        500 mg 100 mL/hr over 60 Minutes Intravenous Every 6 hours 04/11/22 1503 04/13/22 0902   04/11/22 1115  cefTRIAXone (ROCEPHIN) 2 g in sodium chloride 0.9 % 100 mL IVPB  Status:  Discontinued       See Hyperspace for full Linked Orders Report.   2 g 200 mL/hr over 30 Minutes Intravenous Every 24 hours 04/11/22 1101 04/11/22 1503   04/11/22 1115  metroNIDAZOLE (FLAGYL) IVPB 500 mg  Status:  Discontinued       See Hyperspace for full Linked Orders Report.   500 mg 100 mL/hr over 60 Minutes Intravenous Every 12 hours 04/11/22 1101 04/11/22 1503       Assessment/Plan: Problem List: Patient Active Problem List   Diagnosis Date Noted   Colonic mass 04/11/2022   Acute appendicitis 04/11/2022   Hot flashes 10/24/2021   Iron deficiency anemia 10/24/2021   Morbid obesity ([place]) 04/14/2019   Acne vulgaris 01/04/2017   Idiopathic urticaria 09/02/2012   Anxiety 07/17/2012   B12 deficiency 07/17/2012   ACUTE PHARYNGITIS 05/07/2008   GERD 03/24/2008   Allergic asthma, mild intermittent, uncomplicated 50/38/8828   Hypothyroidism 01/21/2007   Attention deficit disorder 01/21/2007   Seasonal and perennial allergic rhinitis 01/21/2007   EYE SURGERY, HX OF 01/21/2007    Path on obstruction mass of the appendix is not back yet.  Clinically she is doing well and will be ready for discharge in next 1-2 days.   2 Days Post-Op    LOS: 3 days   Matt B. Hassell Done, MD, [facility] [phone] to reach the surgeon on call.    04/14/2022 7:44 AM

## 2022-04-15 MED ORDER — OXYCODONE HCL 5 MG PO TABS: 5.0000 mg | ORAL_TABLET | Freq: Four times a day (QID) | ORAL | 0 refills | Status: DC | PRN

## 2022-04-15 MED ORDER — METHOCARBAMOL 500 MG PO TABS
500.0000 mg | ORAL_TABLET | Freq: Three times a day (TID) | ORAL | 0 refills | Status: DC | PRN
Start: 2022-04-15 — End: 2022-04-20

## 2022-04-15 NOTE — Progress Notes (Signed)
Nsg Discharge Note  Admit Date:  04/11/2022 Discharge date: 04/15/2022   Lindon Romp to be D/C'd Home per MD order.  AVS completed.   Removed IV-CDI. Yvetta Coder RN reviewed d/c paperwork with patient and husband. And answered all questions. Wheeled stable patient and belongings to main entrance where patient was picked up by her husband to d/c to home. IV Morphine given per request of patient and approval of Dr. Johnathan Hausen just prior to d/c . Patient/caregiver able to verbalize understanding.  Discharge Medication: Allergies as of 04/15/2022       Reactions   Penicillins Hives, Swelling   Fish-derived Products    Penicillin G Other (See Comments)   Strawberry Extract         Medication List     STOP taking these medications    phentermine 37.5 MG tablet Commonly known as: ADIPEX-P       TAKE these medications    albuterol 108 (90 Base) MCG/ACT inhaler Commonly known as: VENTOLIN HFA Inhale 2 puffs into the lungs every 6 (six) hours as needed for wheezing or shortness of breath.   Allegra Allergy 180 MG tablet Generic drug: fexofenadine Take 180 mg by mouth daily.   B-COMPLEX-C PO Take 1 capsule by mouth daily.   ELDERBERRY PO Take 1 capsule by mouth daily.   famotidine 40 MG tablet Commonly known as: PEPCID TAKE 1 TABLET(40 MG) BY MOUTH TWICE DAILY   ibuprofen 200 MG tablet Commonly known as: ADVIL Take 400 mg by mouth every 6 (six) hours as needed for mild pain, moderate pain or headache.   Iron 100/C 100-250 MG Tabs Generic drug: Iron-Vitamin C Take 1 capsule by mouth daily.   methocarbamol 500 MG tablet Commonly known as: ROBAXIN Take 1 tablet (500 mg total) by mouth every 8 (eight) hours as needed for muscle spasms.   methylphenidate 10 MG tablet Commonly known as: Ritalin Take 1 tablet (10 mg total) by mouth 3 (three) times daily with meals.   multivitamin-iron-minerals-folic acid chewable tablet Chew 1 tablet by mouth daily.   oxyCODONE 5  MG immediate release tablet Commonly known as: Oxy IR/ROXICODONE Take 1 tablet (5 mg total) by mouth every 6 (six) hours as needed for severe pain.   Synthroid 175 MCG tablet Generic drug: levothyroxine TAKE 1 TABLET(175 MCG) BY MOUTH DAILY   traZODone 100 MG tablet Commonly known as: DESYREL TAKE 1 TABLET(100 MG) BY MOUTH AT BEDTIME   Trelegy Ellipta 100-62.5-25 MCG/ACT Aepb Generic drug: Fluticasone-Umeclidin-Vilant 1 inh qd   ZINC 15 PO Take 1 capsule by mouth daily.               Discharge Care Instructions  (From admission, onward)           Start     Ordered   04/15/22 0000  Discharge wound care:       Comments: Dermabond will peel off in a couple of weeks   04/15/22 0921            Discharge Assessment: Vitals:   04/15/22 0430 04/15/22 1107  BP: 134/75 130/75  Pulse: 72   Resp: 17   Temp: 98.3 F (36.8 C)   SpO2:     Skin clean, dry and intact without evidence of skin break down, no evidence of skin tears noted. IV catheter discontinued intact. Site without signs and symptoms of complications - no redness or edema noted at insertion site, patient denies c/o pain - only slight tenderness at site.  Dressing with slight pressure applied.  D/c Instructions-Education: Discharge instructions given to patient/family with verbalized understanding. D/c education completed with patient/family including follow up instructions, medication list, d/c activities limitations if indicated, with other d/c instructions as indicated by MD - patient able to verbalize understanding, all questions fully answered. Patient instructed to return to ED, call 911, or call MD for any changes in condition.  Patient escorted via [place], and D/C home via private auto.  Santa Lighter, RN 04/15/2022 11:32 AM

## 2022-04-15 NOTE — Plan of Care (Signed)
  Problem: Education: Goal: Knowledge of General Education information will improve Description: Including pain rating scale, medication(s)/side effects and non-pharmacologic comfort measures 04/15/2022 1013 by Santa Lighter, RN Outcome: Adequate for Discharge 04/15/2022 1013 by Santa Lighter, RN Outcome: Progressing   Problem: Clinical Measurements: Goal: Ability to maintain clinical measurements within normal limits will improve Outcome: Adequate for Discharge Goal: Will remain free from infection Outcome: Adequate for Discharge Goal: Diagnostic test results will improve Outcome: Adequate for Discharge Goal: Respiratory complications will improve Outcome: Adequate for Discharge Goal: Cardiovascular complication will be avoided Outcome: Adequate for Discharge   Problem: Activity: Goal: Risk for activity intolerance will decrease Outcome: Adequate for Discharge   Problem: Nutrition: Goal: Adequate nutrition will be maintained Outcome: Adequate for Discharge   Problem: Coping: Goal: Level of anxiety will decrease Outcome: Adequate for Discharge   Problem: Elimination: Goal: Will not experience complications related to bowel motility Outcome: Adequate for Discharge Goal: Will not experience complications related to urinary retention Outcome: Adequate for Discharge   Problem: Pain Managment: Goal: General experience of comfort will improve Outcome: Adequate for Discharge   Problem: Safety: Goal: Ability to remain free from injury will improve Outcome: Adequate for Discharge   Problem: Skin Integrity: Goal: Risk for impaired skin integrity will decrease Outcome: Adequate for Discharge   Problem: Education: Goal: Understanding of discharge needs will improve Outcome: Adequate for Discharge Goal: Verbalization of understanding of the causes of altered bowel function will improve Outcome: Adequate for Discharge   Problem: Activity: Goal: Ability to tolerate increased  activity will improve Outcome: Adequate for Discharge   Problem: Bowel/Gastric: Goal: Gastrointestinal status for postoperative course will improve Outcome: Adequate for Discharge   Problem: Health Behavior/Discharge Planning: Goal: Identification of community resources to assist with postoperative recovery needs will improve Outcome: Adequate for Discharge   Problem: Nutritional: Goal: Will attain and maintain optimal nutritional status will improve Outcome: Adequate for Discharge   Problem: Respiratory: Goal: Respiratory status will improve Outcome: Adequate for Discharge   Problem: Skin Integrity: Goal: Will show signs of wound healing Outcome: Adequate for Discharge

## 2022-04-15 NOTE — Discharge Summary (Signed)
Physician Discharge Summary  Patient ID: Shelby Warren MRN: [id number] DOB/AGE: 04-Apr-1966 56 y.o.  PCP: Ann Held, DO  Admit date: 04/11/2022 Discharge date: 04/15/2022  Admission Diagnoses:  appendicitis  Discharge Diagnoses:  mass at the appendiceal orifice producing appendicitis  Principal Problem:   Colonic mass Active Problems:   Acute appendicitis   Surgery:  Lap assisted right hemicolectomy with ileocolic anastomosis & liver biopsy  Discharged Condition: improved  Hospital Course:   Had above mentioned surgery on 04/12/22.  Was begun on clears and diet advanced and ready for discharge on Sunday, [place], 2023.  Path pending  Consults: none  Significant Diagnostic Studies: pathology report on cecal mass and liver nodule are pending    Discharge Exam: Blood pressure 134/75, pulse 72, temperature 98.3 F (36.8 C), temperature source Oral, resp. rate 17, height '5\' 6"'$  (1.676 m), weight 72.4 kg, last menstrual period 07/31/2018, SpO2 98 %. Incisons are covered with Dermabond and are not red  Disposition: Discharge disposition: 01-Home or Self Care       Discharge Instructions     Call MD for:  redness, tenderness, or signs of infection (pain, swelling, redness, odor or green/yellow discharge around incision site)   Complete by: As directed    Diet general   Complete by: As directed    Would stay on soft foods for ~ 1 week and then advance diet as tolerated.  Opioids can contribute to constipation so take colace if needed.   Discharge instructions   Complete by: As directed    May shower and shampoo when you arrive home   Discharge wound care:   Complete by: As directed    Dermabond will peel off in a couple of weeks   Increase activity slowly   Complete by: As directed       Allergies as of 04/15/2022       Reactions   Penicillins Hives, Swelling   Fish-derived Products    Penicillin G Other (See Comments)   Strawberry Extract          Medication List     STOP taking these medications    phentermine 37.5 MG tablet Commonly known as: ADIPEX-P       TAKE these medications    albuterol 108 (90 Base) MCG/ACT inhaler Commonly known as: VENTOLIN HFA Inhale 2 puffs into the lungs every 6 (six) hours as needed for wheezing or shortness of breath.   Allegra Allergy 180 MG tablet Generic drug: fexofenadine Take 180 mg by mouth daily.   B-COMPLEX-C PO Take 1 capsule by mouth daily.   ELDERBERRY PO Take 1 capsule by mouth daily.   famotidine 40 MG tablet Commonly known as: PEPCID TAKE 1 TABLET(40 MG) BY MOUTH TWICE DAILY   ibuprofen 200 MG tablet Commonly known as: ADVIL Take 400 mg by mouth every 6 (six) hours as needed for mild pain, moderate pain or headache.   Iron 100/C 100-250 MG Tabs Generic drug: Iron-Vitamin C Take 1 capsule by mouth daily.   methocarbamol 500 MG tablet Commonly known as: ROBAXIN Take 1 tablet (500 mg total) by mouth every 8 (eight) hours as needed for muscle spasms.   methylphenidate 10 MG tablet Commonly known as: Ritalin Take 1 tablet (10 mg total) by mouth 3 (three) times daily with meals.   multivitamin-iron-minerals-folic acid chewable tablet Chew 1 tablet by mouth daily.   oxyCODONE 5 MG immediate release tablet Commonly known as: Oxy IR/ROXICODONE Take 1 tablet (5 mg total) by  mouth every 6 (six) hours as needed for severe pain.   Synthroid 175 MCG tablet Generic drug: levothyroxine TAKE 1 TABLET(175 MCG) BY MOUTH DAILY   traZODone 100 MG tablet Commonly known as: DESYREL TAKE 1 TABLET(100 MG) BY MOUTH AT BEDTIME   Trelegy Ellipta 100-62.5-25 MCG/ACT Aepb Generic drug: Fluticasone-Umeclidin-Vilant 1 inh qd   ZINC 15 PO Take 1 capsule by mouth daily.               Discharge Care Instructions  (From admission, onward)           Start     Ordered   04/15/22 0000  Discharge wound care:       Comments: Dermabond will peel off in a couple of  weeks   04/15/22 0174            Follow-up Information     Roma Schanz R, DO. Schedule an appointment as soon as possible for a visit in 2 days.   Specialty: Family Medicine Why: Follow up from ER visit Contact information: [address] [place] [address] [phone]         [place]. Go to .   Specialty: Emergency Medicine Why: As needed, If symptoms worsen Contact information: [address] [id number] J[address] ZESP QZRA[facility] [place]        Dwan Bolt, MD Follow up in 2 week(s).   Specialty: General Surgery Why: routine followup with Dr. Delight Stare information: [address] [place] [address] [phone]                 Signed: Pedro Earls 04/15/2022, 9:24 AM

## 2022-04-15 NOTE — Plan of Care (Signed)
  Problem: Education: Goal: Knowledge of General Education information will improve Description Including pain rating scale, medication(s)/side effects and non-pharmacologic comfort measures Outcome: Progressing   Problem: Health Behavior/Discharge Planning: Goal: Ability to manage health-related needs will improve Outcome: Progressing   

## 2022-04-17 ENCOUNTER — Other Ambulatory Visit: Payer: Self-pay | Admitting: Surgery

## 2022-04-17 ENCOUNTER — Encounter: Payer: Self-pay | Admitting: *Deleted

## 2022-04-17 DIAGNOSIS — C189 Malignant neoplasm of colon, unspecified: Secondary | ICD-10-CM

## 2022-04-17 LAB — SURGICAL PATHOLOGY

## 2022-04-17 NOTE — Progress Notes (Signed)
PATIENT NAVIGATOR PROGRESS NOTE  Name: Shelby Warren Date: 04/17/2022 MRN: [id number]  DOB: 1965-12-07   Reason for visit:  Introductory phone call  Comments:  Spoke with patient and scheduled her for New Patient appt on 04/27/22 at 1:40 with Dr Benay Spice.  Directions to building and parking reviewed as well as one support person allowed in appt. Given contact information to call with any issues or questions    Time spent counseling/coordinating care: 30-45 minutes

## 2022-04-18 NOTE — Telephone Encounter (Addendum)
Key: [id number]   PA Approved  Approved from 04/18/2022 to 04/17/2025.

## 2022-04-20 ENCOUNTER — Other Ambulatory Visit (HOSPITAL_COMMUNITY): Payer: Self-pay

## 2022-04-20 ENCOUNTER — Inpatient Hospital Stay: Payer: Commercial Managed Care - PPO | Attending: Oncology | Admitting: Oncology

## 2022-04-20 ENCOUNTER — Encounter: Payer: Self-pay | Admitting: *Deleted

## 2022-04-20 VITALS — BP 126/85 | HR 70 | Temp 98.5°F | Resp 20 | Ht 66.5 in | Wt 161.2 lb

## 2022-04-20 DIAGNOSIS — F988 Other specified behavioral and emotional disorders with onset usually occurring in childhood and adolescence: Secondary | ICD-10-CM | POA: Diagnosis not present

## 2022-04-20 DIAGNOSIS — J45909 Unspecified asthma, uncomplicated: Secondary | ICD-10-CM | POA: Diagnosis not present

## 2022-04-20 DIAGNOSIS — E538 Deficiency of other specified B group vitamins: Secondary | ICD-10-CM | POA: Diagnosis not present

## 2022-04-20 DIAGNOSIS — C189 Malignant neoplasm of colon, unspecified: Secondary | ICD-10-CM

## 2022-04-20 DIAGNOSIS — E039 Hypothyroidism, unspecified: Secondary | ICD-10-CM | POA: Insufficient documentation

## 2022-04-20 DIAGNOSIS — Z9049 Acquired absence of other specified parts of digestive tract: Secondary | ICD-10-CM | POA: Insufficient documentation

## 2022-04-20 DIAGNOSIS — C182 Malignant neoplasm of ascending colon: Secondary | ICD-10-CM

## 2022-04-20 DIAGNOSIS — C180 Malignant neoplasm of cecum: Secondary | ICD-10-CM | POA: Diagnosis present

## 2022-04-20 MED ORDER — CAPECITABINE 500 MG PO TABS
ORAL_TABLET | ORAL | 0 refills | Status: DC
  Filled 2022-04-20: qty 84, fill #0

## 2022-04-20 NOTE — Progress Notes (Unsigned)
PATIENT NAVIGATOR PROGRESS NOTE  Name: Shelby Warren Date: 04/20/2022 MRN: [id number]  DOB: [birth year]   Reason for visit:  Initial visit with Dr Benay Spice  Comments:  Met with patient and her husband during visit with Dr Benay Spice Given written information on Capecitabine which is scheduled to start on 8/14 with labs and MD visit prior to taking first dose Will be added to GI conference discussion on 8/2 Given contact information to call with any issues or questions Given Chemo alert card to carry in case of Emergency or urgent care visits    Time spent counseling/coordinating care: > 60 minutes

## 2022-04-20 NOTE — Progress Notes (Signed)
[facility] New Patient Consult   Requesting MD: Dwan Bolt, Md [address]. [place],  [place] [address]   Lailany B Kolasinski 56 y.o.  1966-05-03    Reason for Consult: Colon cancer   HPI: Ms. Medel reports the acute onset of right lower abdominal pain 04/10/2022.  The pain persisted and she presented to the emergency room 04/11/2022.  A CT of the abdomen and pelvis on 04/11/2022 revealed no suspicious hepatic lesion.  Masslike thickening was noted in the region of the cecum with surrounding haziness in the pericecal fat.  The appendix is dilated and inflamed.  There is periappendiceal inflammation without a discrete abscess.  Borderline enlarged and mildly enlarged ileocolic nodes were noted.      She underwent a laparoscopic right colectomy and ileocolic anastomosis, and liver biopsy by Dr. Zenia Resides on 04/12/2022.  A firm mass was noted at the base of the appendix.  A small right liver nodule was noted.  No other evidence of metastatic disease.  A CT of the chest 04/13/2022 revealed no evidence of metastatic disease.  A small nodule was removed from the right liver surface.  The ileum was divided 15 cm proximal to the of the cecal valve.  The pathology revealed a moderately differentiated adenocarcinoma of the cecum/ileocecal valve and entire appendix.  Tumor invades into pericolonic soft tissue.  0/31 lymph nodes, negative resection margins.  No macroscopic tumor perforation.  No lymphovascular perineural invasion.  Negative resection margins.  No loss of mismatch repair protein expression.  She was discharged home 04/15/2022.  She continues to have abdominal soreness.  Her bowels are functioning.                          Past Medical History:  Diagnosis Date   ADD (attention deficit disorder)    Allergic rhinitis    Asthma    B12 deficiency    Hypothyroidism     .  G2 P2  Past Surgical History:  Procedure Laterality Date   ANAL FISSURE REPAIR     APPENDECTOMY  N/A 04/12/2022   Procedure: APPENDECTOMY;  Surgeon: Dwan Bolt, MD;  Location: [facility];  Service: General;  Laterality: N/A;   LAPAROSCOPIC RIGHT HEMI COLECTOMY N/A 04/12/2022   Procedure: LAPAROSCOPIC RIGHT HEMI COLECTOMY AND TERMINAL ILEUM;  Surgeon: Dwan Bolt, MD;  Location: [facility];  Service: General;  Laterality: N/A;   lasic eye surgery     LIVER BIOPSY Right 04/12/2022   Procedure: LIVER BIOPSY;  Surgeon: Dwan Bolt, MD;  Location: [facility];  Service: General;  Laterality: Right;   spincture muscle     TOTAL ABDOMINAL HYSTERECTOMY  07/04/2020   dr Helane Rima    Medications: Reviewed  Allergies:  Allergies  Allergen Reactions   Penicillins Hives and Swelling   Fish-Derived Products    Penicillin G Other (See Comments)   Strawberry Extract     Family history: Her paternal grandmother had breast cancer.  Social History:   She lives with her husband in [place].  She works as a Freight forwarder for [employer].  She does not use cigarettes.  Social alcohol use.  No transfusion history.  No risk factor for HIV or hepatitis.  ROS:   Positives include: 40 pound intentional weight loss over the past 2 years, seasonal asthma "trace blood in the urine "on annual GYN exams  A complete ROS was otherwise negative.  Physical Exam:  Last menstrual period 07/31/2018.  HEENT: Oropharynx without visible mass, neck without mass Lungs: Clear bilaterally Cardiac: Regular rate and rhythm Abdomen: No hepatosplenomegaly, healing surgical incisions with glue in place  Vascular: No leg edema Lymph nodes: No cervical, supraclavicular, axillary, or inguinal nodes Neurologic: Alert and oriented, motor exam appears intact in the upper and lower extremities bilaterally Skin: No rash Musculoskeletal: No spine tenderness   LAB:  CBC  Lab Results  Component Value Date   WBC 10.2 04/13/2022   HGB 9.5 (L) 04/13/2022   HCT 29.2 (L) 04/13/2022   MCV 89.3 04/13/2022   PLT 331 04/13/2022    NEUTROABS 11.0 (H) 04/11/2022        CMP  Lab Results  Component Value Date   NA 142 04/13/2022   K 3.8 04/13/2022   CL 106 04/13/2022   CO2 23 04/13/2022   GLUCOSE 101 (H) 04/13/2022   BUN 6 04/13/2022   CREATININE 0.78 04/13/2022   CALCIUM 8.5 (L) 04/13/2022   PROT 6.7 04/11/2022   ALBUMIN 3.5 04/11/2022   AST 17 04/11/2022   ALT 18 04/11/2022   ALKPHOS 52 04/11/2022   BILITOT 0.4 04/11/2022   GFRNONAA >60 04/13/2022     Lab Results  Component Value Date   CEA1 22.0 (H) 04/12/2022    Imaging: As per HPI, CT images from 04/11/2022 reviewed    Assessment/Plan:   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, stage IIa (T3N0), status post a right colectomy 04/12/2022  No macroscopic tumor perforation, negative resection margins 0/31 nodes, no tumor deposits, no lymphovascular perineural invasion, no loss of mismatch repair protein expression CT abdomen/pelvis 04/11/2022-cecal mass with obstruction of the ostium of the appendix with evidence of acute appendicitis, lymphadenopathy in the ileocolic mesentery CT chest 04/13/2022-no evidence of metastatic disease, pneumoperitoneum and trace bilateral pleural effusions felt to be secondary to surgery Elevated preoperative CEA  Asthma ADD G2 P2 Hysterectomy Hypothyroid History of an anal fissure History of a colon polyp-sigmoid colon polyp removed 05/29/2017-mucosal prolapse type polyp, melanosis coli, no dysplasia or malignancy  Disposition:   Ms. Mcnew has been diagnosed with stage II colon cancer.  She presented with acute right lower quadrant pain, likely related to appendix inflammation from an obstructing cecum carcinoma.  I reviewed details of the surgical pathology report, the prognosis, and adjuvant treatment options with her.  She has early stage II colon cancer.  The tumor does not have high risk features other than the elevated CEA being a potential minor  high risk factor.  She has an excellent prognosis for a long-term disease-free survival.  We discussed the indication for adjuvant therapy.  I explained data supporting the use of adjuvant 5-fluorouracil and oxaliplatin based chemotherapy in patients with resected stage III colon cancer.  She understands the lack of clear data to support a benefit from adjuvant therapy in patients with resected stage II disease.  We discussed observation, a trial of single agent capecitabine, and treatment decision directed therapy based on circulating tumor DNA testing.  She understands there is a potential small absolute decrease in the relapse rate with adjuvant chemotherapy in this setting.  I do not recommend oxaliplatin.  She is not comfortable basing a decision on DNA testing.  She would like to proceed with adjuvant systemic therapy.  I recommend single agent capecitabine.  We reviewed potential toxicities associated with capecitabine including the chance of nausea, mucositis, cardiac toxicity, alopecia, diarrhea, and hematologic toxicity.  We  discussed the rash, sun sensitivity, hyperpigmentation, and hand/foot syndrome associated with capecitabine.  She agrees to proceed.   She does not appear to have hereditary nonpolyposis colon cancer syndrome, but her family members are at increased risk of developing colorectal cancer and should receive appropriate screening.  She should have a screening colonoscopy within the next 6-12 months.  She will return for an office visit 05/14/2022 with a plan to begin adjuvant capecitabine that day.   Betsy Coder, MD  04/20/2022, 1:43 PM

## 2022-04-22 ENCOUNTER — Encounter: Payer: Self-pay | Admitting: Oncology

## 2022-04-23 ENCOUNTER — Telehealth: Payer: Self-pay | Admitting: Pharmacy Technician

## 2022-04-23 ENCOUNTER — Other Ambulatory Visit: Payer: Self-pay | Admitting: Pharmacist

## 2022-04-23 ENCOUNTER — Other Ambulatory Visit: Payer: Self-pay | Admitting: *Deleted

## 2022-04-23 ENCOUNTER — Telehealth: Payer: Self-pay | Admitting: Pharmacist

## 2022-04-23 ENCOUNTER — Other Ambulatory Visit (HOSPITAL_COMMUNITY): Payer: Self-pay

## 2022-04-23 DIAGNOSIS — C189 Malignant neoplasm of colon, unspecified: Secondary | ICD-10-CM

## 2022-04-23 MED ORDER — CAPECITABINE 500 MG PO TABS: 1500.0000 mg | ORAL_TABLET | Freq: Two times a day (BID) | ORAL | 0 refills | Status: DC

## 2022-04-23 NOTE — Telephone Encounter (Signed)
Oral Oncology Pharmacist Encounter  Received new prescription for Xeloda (capecitabine) for the adjuvant treatment of stage II colon cancer, planned duration of 24 weeks.  BMP from 04/13/22 assessed, no relevant lab abnormalities. Prescription dose and frequency assessed.   Current medication list in Epic reviewed, no relevant DDIs with capecitabine identified.  Evaluated chart and no patient barriers to medication adherence identified.   Prescription has been e-scribed to the [facility] will continue to follow for insurance authorization, copayment issues, initial counseling and start date.  Patient agreed to treatment on 04/20/22 per MD documentation.  Darl Pikes, PharmD, BCPS, BCOP, CPP Hematology/Oncology Clinical Pharmacist Practitioner [facility] [phone]  04/23/2022 10:34 AM

## 2022-04-23 NOTE — Telephone Encounter (Signed)
Oral Oncology Patient Advocate Encounter  Prior Authorization for Capecitabine has been approved.    PA#  [id number] Effective dates: 04/23/2022 through 04/24/2023  Patient must fill at [facility].    Lady Deutscher, CPhT-Adv Pharmacy Patient Advocate Specialist [place] Patient Advocate Team Direct Number: [phone]  Fax: [phone]

## 2022-04-23 NOTE — Telephone Encounter (Signed)
Oral Oncology Patient Advocate Encounter   Received notification that prior authorization for Capecitabine is required.   PA submitted on 04/23/2022 Key [id number] Status is pending     Lady Deutscher, CPhT-Adv Pharmacy Patient Advocate Specialist [facility] Patient Advocate Team Direct Number: [phone]  Fax: [phone]

## 2022-04-23 NOTE — Progress Notes (Signed)
Referral placed to Dr Carlean Purl per patient request.

## 2022-04-26 NOTE — Telephone Encounter (Signed)
PAtient spoke with [facility], she has a $0 copay and has her medication set-up for delivery on 05/04/22

## 2022-04-26 NOTE — Telephone Encounter (Signed)
Oral Chemotherapy Pharmacist Encounter  [facility] will deliver medication to patient on 05/04/22. She knows the plan is to get started on 8/14.   Patient Education I spoke with patient for overview of new oral chemotherapy medication: Xeloda (capecitabine) for the adjuvant treatment of stage II colon cancer, planned duration of 24 weeks.  Pt is doing well. Counseled patient on administration, dosing, side effects, monitoring, drug-food interactions, safe handling, storage, and disposal. Patient will take 3 tablets (1,500 mg total) by mouth 2 (two) times daily after a meal. Take for 14 days, then hold for 7 days. Repeat every 21 days.  Side effects include but not limited to: diarrhea, hand-foot syndrome, mouth sores, edema, decreased wbc, fatigue, N/V Diarrhea: Instructed patient to pick up loperamide to have on hand if needed. She knows to call if she is having 4 or more loose stools per day Hand-foot syndrome: Recommended the use of [place] Smooth Extra Care 20. She knows to report any skin changes she notices Mouth sores: Instructed patient to call for magic mouthwash if needed    Reviewed with patient importance of keeping a medication schedule and plan for any missed doses.  After discussion with patient no patient barriers to medication adherence identified.   Ms. Klinedinst voiced understanding and appreciation. All questions answered. Medication handout and calendar provided.  Provided patient with [facility] phone number. Patient knows to call the office with questions or concerns. Oral Chemotherapy [facility] will continue to follow.  Darl Pikes, PharmD, BCPS, BCOP, CPP Hematology/Oncology Clinical Pharmacist Practitioner [place] [phone]  04/26/2022 9:27 AM

## 2022-04-27 ENCOUNTER — Inpatient Hospital Stay: Payer: Commercial Managed Care - PPO | Admitting: Oncology

## 2022-04-28 ENCOUNTER — Other Ambulatory Visit: Payer: Self-pay | Admitting: Family Medicine

## 2022-04-28 DIAGNOSIS — G4700 Insomnia, unspecified: Secondary | ICD-10-CM

## 2022-05-01 ENCOUNTER — Telehealth: Payer: Self-pay

## 2022-05-01 NOTE — Telephone Encounter (Signed)
CSW attempted to contact patient.  Left vm. 

## 2022-05-02 ENCOUNTER — Other Ambulatory Visit: Payer: Self-pay

## 2022-05-02 NOTE — Progress Notes (Signed)
The proposed treatment discussed in conference is for discussion purpose only and is not a binding recommendation.  The patients have not been physically examined, or presented with their treatment options.  Therefore, final treatment plans cannot be decided.  

## 2022-05-13 ENCOUNTER — Other Ambulatory Visit: Payer: Self-pay | Admitting: Family Medicine

## 2022-05-13 DIAGNOSIS — F988 Other specified behavioral and emotional disorders with onset usually occurring in childhood and adolescence: Secondary | ICD-10-CM

## 2022-05-14 ENCOUNTER — Inpatient Hospital Stay: Payer: Commercial Managed Care - PPO | Attending: Oncology

## 2022-05-14 ENCOUNTER — Inpatient Hospital Stay: Payer: Commercial Managed Care - PPO | Admitting: Nurse Practitioner

## 2022-05-14 ENCOUNTER — Encounter: Payer: Self-pay | Admitting: Nurse Practitioner

## 2022-05-14 ENCOUNTER — Encounter: Payer: Self-pay | Admitting: *Deleted

## 2022-05-14 VITALS — BP 119/76 | HR 77 | Temp 98.1°F | Resp 18 | Ht 66.5 in | Wt 159.0 lb

## 2022-05-14 DIAGNOSIS — C189 Malignant neoplasm of colon, unspecified: Secondary | ICD-10-CM | POA: Diagnosis not present

## 2022-05-14 DIAGNOSIS — R110 Nausea: Secondary | ICD-10-CM | POA: Insufficient documentation

## 2022-05-14 DIAGNOSIS — R1031 Right lower quadrant pain: Secondary | ICD-10-CM | POA: Diagnosis not present

## 2022-05-14 DIAGNOSIS — C180 Malignant neoplasm of cecum: Secondary | ICD-10-CM | POA: Diagnosis present

## 2022-05-14 DIAGNOSIS — D649 Anemia, unspecified: Secondary | ICD-10-CM | POA: Diagnosis not present

## 2022-05-14 LAB — CMP (CANCER CENTER ONLY)
ALT: 14 U/L (ref 0–44)
AST: 14 U/L — ABNORMAL LOW (ref 15–41)
Albumin: 4.4 g/dL (ref 3.5–5.0)
Alkaline Phosphatase: 31 U/L — ABNORMAL LOW (ref 38–126)
Anion gap: 7 (ref 5–15)
BUN: 13 mg/dL (ref 6–20)
CO2: 31 mmol/L (ref 22–32)
Calcium: 9.6 mg/dL (ref 8.9–10.3)
Chloride: 103 mmol/L (ref 98–111)
Creatinine: 0.92 mg/dL (ref 0.44–1.00)
GFR, Estimated: >60 mL/min (ref >60)
Glucose, Bld: 124 mg/dL — ABNORMAL HIGH (ref 70–99)
Potassium: 4.4 mmol/L (ref 3.5–5.1)
Sodium: 141 mmol/L (ref 135–145)
Total Bilirubin: 0.2 mg/dL — ABNORMAL LOW (ref 0.3–1.2)
Total Protein: 6.9 g/dL (ref 6.5–8.1)

## 2022-05-14 LAB — CBC WITH DIFFERENTIAL (CANCER CENTER ONLY)
Abs Immature Granulocytes: 0.01 10*3/uL (ref 0.00–0.07)
Basophils Absolute: 0 10*3/uL (ref 0.0–0.1)
Basophils Relative: 0 %
Eosinophils Absolute: 0.3 10*3/uL (ref 0.0–0.5)
Eosinophils Relative: 6 %
HCT: 37.7 % (ref 36.0–46.0)
Hemoglobin: 11.9 g/dL — ABNORMAL LOW (ref 12.0–15.0)
Immature Granulocytes: 0 %
Lymphocytes Relative: 31 %
Lymphs Abs: 1.4 10*3/uL (ref 0.7–4.0)
MCH: 28 pg (ref 26.0–34.0)
MCHC: 31.6 g/dL (ref 30.0–36.0)
MCV: 88.7 fL (ref 80.0–100.0)
Monocytes Absolute: 0.4 10*3/uL (ref 0.1–1.0)
Monocytes Relative: 8 %
Neutro Abs: 2.5 10*3/uL (ref 1.7–7.7)
Neutrophils Relative %: 55 %
Platelet Count: 277 10*3/uL (ref 150–400)
RBC: 4.25 MIL/uL (ref 3.87–5.11)
RDW: 13.8 % (ref 11.5–15.5)
WBC Count: 4.7 10*3/uL (ref 4.0–10.5)
nRBC: 0 % (ref 0.0–0.2)

## 2022-05-14 LAB — CEA (ACCESS): CEA (CHCC): 1.83 ng/mL (ref 0.00–5.00)

## 2022-05-14 MED ORDER — METHYLPHENIDATE HCL 10 MG PO TABS: 10.0000 mg | ORAL_TABLET | Freq: Three times a day (TID) | ORAL | 0 refills | Status: DC

## 2022-05-14 NOTE — Telephone Encounter (Signed)
Requesting: methylphenidate '10mg'$  Contract:  UDS: 10/24/21 Last Visit: 10/24/21 Next Visit: none Last Refill: 04/09/22  Message sent to patient to follow up   Please Advise

## 2022-05-14 NOTE — Progress Notes (Signed)
  Shelby Warren OFFICE PROGRESS NOTE   Diagnosis: Colon cancer  INTERVAL HISTORY:   Shelby Warren returns as scheduled.  Bowels are moving though she is experiencing constipation.  She had issues with constipation prior to surgery.  She recently did a Dulcolax suppository with good results.  She continues MiraLAX.  Mild intermittent nausea.  No vomiting.  She is eating multiple small meals a day.  She notes feeling very full if she eats a large meal.  She has intermittent discomfort at the right lower abdomen.  Energy level varies.  Objective:  Vital signs in last 24 hours:  Blood pressure 119/76, pulse 77, temperature 98.1 F (36.7 C), temperature source Oral, resp. rate 18, height 5' 6.5" (1.689 m), weight 159 lb (72.1 kg), last menstrual period 07/31/2018, SpO2 100 %.    HEENT: No thrush or ulcers. Resp: Lungs clear bilaterally. Cardio: The rate and rhythm. GI: Abdomen soft and nontender.  Mild fullness at the right lower abdomen.  Healed surgical incisions. Vascular: No leg edema.  Lab Results:  Lab Results  Component Value Date   WBC 4.7 05/14/2022   HGB 11.9 (L) 05/14/2022   HCT 37.7 05/14/2022   MCV 88.7 05/14/2022   PLT 277 05/14/2022   NEUTROABS 2.5 05/14/2022    Imaging:  No results found.  Medications: I have reviewed the patient's current medications.  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, stage IIa (T3N0), status post a right colectomy 04/12/2022  No macroscopic tumor perforation, negative resection margins 0/31 nodes, no tumor deposits, no lymphovascular perineural invasion, no loss of mismatch repair protein expression CT abdomen/pelvis 04/11/2022-cecal mass with obstruction of the ostium of the appendix with evidence of acute appendicitis, lymphadenopathy in the ileocolic mesentery CT chest 04/13/2022-no evidence of metastatic disease, pneumoperitoneum and trace  bilateral pleural effusions felt to be secondary to surgery Elevated preoperative CEA; CEA in normal range 05/14/2022 (1.83) Cycle 1 Xeloda 05/14/2022   Asthma ADD G2 P2 Hysterectomy Hypothyroid History of an anal fissure History of a colon polyp-sigmoid colon polyp removed 05/29/2017-mucosal prolapse type polyp, melanosis coli, no dysplasia or malignancy  Disposition: Shelby Warren appears stable.  She is scheduled to begin cycle 1 adjuvant Xeloda today.  We again reviewed potential toxicities, questions answered.  She agrees to proceed.  CBC and chemistry panel reviewed.  Labs adequate to proceed as above.  CEA is now in normal range.  She will return for lab and follow-up the week of 05/28/2022.  We are available to see her sooner if needed.  Patient seen with Dr. Benay Spice.    Shelby Warren ANP/GNP-BC   05/14/2022  8:26 AM  This was a shared visit with Shelby Warren.  Shelby Warren appears well.  The CEA is normal and the anemia has improved.  The plan is to begin adjuvant capecitabine today.   Shelby Manson, MD

## 2022-05-17 ENCOUNTER — Encounter: Payer: Self-pay | Admitting: Gastroenterology

## 2022-05-18 ENCOUNTER — Encounter: Payer: Self-pay | Admitting: *Deleted

## 2022-05-21 ENCOUNTER — Encounter: Payer: Self-pay | Admitting: Oncology

## 2022-05-22 ENCOUNTER — Other Ambulatory Visit: Payer: Self-pay | Admitting: *Deleted

## 2022-05-22 DIAGNOSIS — C189 Malignant neoplasm of colon, unspecified: Secondary | ICD-10-CM

## 2022-05-22 MED ORDER — CAPECITABINE 500 MG PO TABS: 1500.0000 mg | ORAL_TABLET | Freq: Two times a day (BID) | ORAL | 0 refills | Status: DC

## 2022-05-25 ENCOUNTER — Encounter: Payer: Self-pay | Admitting: Family Medicine

## 2022-05-25 ENCOUNTER — Telehealth (INDEPENDENT_AMBULATORY_CARE_PROVIDER_SITE_OTHER): Payer: Commercial Managed Care - PPO | Admitting: Family Medicine

## 2022-05-25 DIAGNOSIS — E038 Other specified hypothyroidism: Secondary | ICD-10-CM | POA: Diagnosis not present

## 2022-05-25 DIAGNOSIS — C182 Malignant neoplasm of ascending colon: Secondary | ICD-10-CM

## 2022-05-25 DIAGNOSIS — F988 Other specified behavioral and emotional disorders with onset usually occurring in childhood and adolescence: Secondary | ICD-10-CM | POA: Diagnosis not present

## 2022-05-25 NOTE — Assessment & Plan Note (Signed)
Lab Results  Component Value Date   TSH 1.01 12/04/2021   con't current dose synthroid  Will check once she is able to f/u after her chemo is done

## 2022-05-25 NOTE — Progress Notes (Signed)
MyChart Video Visit    Virtual Visit via Video Note   This visit type was conducted due to national recommendations for restrictions regarding the COVID-19 Pandemic (e.g. social distancing) in an effort to limit this patient's exposure and mitigate transmission in our community. This patient is at least at moderate risk for complications without adequate follow up. This format is felt to be most appropriate for this patient at this time. Physical exam was limited by quality of the video and audio technology used for the visit. Nira Conn  was able to get the patient set up on a video visit.  Patient location: Home Patient and provider in visit Provider location: Office  I discussed the limitations of evaluation and management by telemedicine and the availability of in person appointments. The patient expressed understanding and agreed to proceed.  Visit Date: 05/25/2022  Today's healthcare provider: Ann Held, DO     Subjective:    Patient ID: Shelby Warren, female    DOB: 05-Nov-1965, 56 y.o.   MRN: [id number]  Chief Complaint  Patient presents with   Hospitalization Follow-up    Pt had surgery to remove appendix and colon mass    HPI Patient is in today for f/u thyroid and add.  She was recently dx with colon cancer and had surgery and is now on chemo.   Overall she is doing well--- today is not a good day however--- + nausea   Past Medical History:  Diagnosis Date   ADD (attention deficit disorder)    Allergic rhinitis    Asthma    B12 deficiency    Hypothyroidism     Past Surgical History:  Procedure Laterality Date   ANAL FISSURE REPAIR     APPENDECTOMY N/A 04/12/2022   Procedure: APPENDECTOMY;  Surgeon: Dwan Bolt, MD;  Location: [place] [facility];  Service: General;  Laterality: N/A;   LAPAROSCOPIC RIGHT HEMI COLECTOMY N/A 04/12/2022   Procedure: LAPAROSCOPIC RIGHT HEMI COLECTOMY AND TERMINAL ILEUM;  Surgeon: Dwan Bolt, MD;  Location: [place];  Service:  General;  Laterality: N/A;   lasic eye surgery     LIVER BIOPSY Right 04/12/2022   Procedure: LIVER BIOPSY;  Surgeon: Dwan Bolt, MD;  Location: [place];  Service: General;  Laterality: Right;   spincture muscle     TOTAL ABDOMINAL HYSTERECTOMY  07/04/2020   dr Helane Rima    Family History  Problem Relation Age of Onset   Breast cancer Paternal Grandmother    Thyroid disease Paternal Grandmother    Diabetes Maternal Grandfather     Social History   Socioeconomic History   Marital status: Married    Spouse name: Not on file   Number of children: 2   Years of education: Not on file   Highest education level: Not on file  Occupational History    Employer: [employer]   Occupation: Homemaker  Tobacco Use   Smoking status: Never   Smokeless tobacco: Never  Vaping Use   Vaping Use: Never used  Substance and Sexual Activity   Alcohol use: Yes    Comment: occasional    Drug use: No   Sexual activity: Not on file  Other Topics Concern   Not on file  Social History Narrative   Not on file   Social Determinants of Health   Financial Resource Strain: Not on file  Food Insecurity: Not on file  Transportation Needs: Not on file  Physical Activity: Not on file  Stress: Not on file  Social Connections: Not on file  Intimate Partner Violence: Not on file    Outpatient Medications Prior to Visit  Medication Sig Dispense Refill   albuterol (VENTOLIN HFA) 108 (90 Base) MCG/ACT inhaler Inhale 2 puffs into the lungs every 6 (six) hours as needed for wheezing or shortness of breath. 18 g 5   capecitabine (XELODA) 500 MG tablet Take 3 tablets (1,500 mg total) by mouth 2 (two) times daily after a meal. Take for 14 days, then hold for 7 days. Repeat every 21 days. Start cycle on 06/04/22 84 tablet 0   Docusate Sodium (COLACE PO) Take 3 capsules by mouth daily.     famotidine (PEPCID) 40 MG tablet TAKE 1 TABLET(40 MG) BY MOUTH TWICE DAILY 180 tablet 1   Fluticasone-Umeclidin-Vilant  (TRELEGY ELLIPTA) 100-62.5-25 MCG/ACT AEPB 1 inh qd 1 each 11   methylphenidate (RITALIN) 10 MG tablet Take 1 tablet (10 mg total) by mouth 3 (three) times daily with meals. 90 tablet 0   Polyethylene Glycol 3350 (MIRALAX PO) Take 17 g by mouth daily.     SYNTHROID 175 MCG tablet TAKE 1 TABLET(175 MCG) BY MOUTH DAILY 30 tablet 2   traZODone (DESYREL) 100 MG tablet TAKE 1 TABLET(100 MG) BY MOUTH AT BEDTIME 90 tablet 0   fexofenadine (ALLEGRA ALLERGY) 180 MG tablet Take 180 mg by mouth daily.     phentermine 37.5 MG capsule Take 37.5 mg by mouth every morning.     ibuprofen (ADVIL) 200 MG tablet Take 400 mg by mouth every 6 (six) hours as needed for mild pain, moderate pain or headache. (Patient not taking: Reported on 04/20/2022)     Iron-Vitamin C (IRON 100/C) 100-250 MG TABS Take 1 capsule by mouth daily. (Patient not taking: Reported on 05/14/2022)     No facility-administered medications prior to visit.    Allergies  Allergen Reactions   Penicillins Hives and Swelling   Fish-Derived Products    Penicillin G Other (See Comments)   Strawberry Extract     Review of Systems  Constitutional:  Negative for fever and malaise/fatigue.  HENT:  Negative for congestion.   Eyes:  Negative for blurred vision.  Respiratory:  Negative for cough and shortness of breath.   Cardiovascular:  Negative for chest pain, palpitations and leg swelling.  Gastrointestinal:  Negative for vomiting.  Musculoskeletal:  Negative for back pain.  Skin:  Negative for rash.  Neurological:  Negative for loss of consciousness and headaches.      Objective:    Physical Exam Vitals and nursing note reviewed.  Constitutional:      Appearance: Normal appearance. She is normal weight.  Pulmonary:     Effort: Pulmonary effort is normal.  Neurological:     Mental Status: She is alert.  Psychiatric:        Mood and Affect: Mood normal.        Behavior: Behavior normal.        Thought Content: Thought content  normal.        Judgment: Judgment normal.   LMP 07/31/2018  Wt Readings from Last 3 Encounters:  05/14/22 159 lb (72.1 kg)  04/20/22 161 lb 3.2 oz (73.1 kg)  04/14/22 159 lb 9.8 oz (72.4 kg)    Diabetic Foot Exam - Simple   No data filed    Lab Results  Component Value Date   WBC 4.7 05/14/2022   HGB 11.9 (L) 05/14/2022   HCT 37.7 05/14/2022   PLT  277 05/14/2022   GLUCOSE 124 (H) 05/14/2022   CHOL 200 07/04/2012   TRIG 77.0 07/04/2012   HDL 61.30 07/04/2012   LDLCALC 123 (H) 07/04/2012   ALT 14 05/14/2022   AST 14 (L) 05/14/2022   NA 141 05/14/2022   K 4.4 05/14/2022   CL 103 05/14/2022   CREATININE 0.92 05/14/2022   BUN 13 05/14/2022   CO2 31 05/14/2022   TSH 1.01 12/04/2021   INR 1.0 04/11/2022   HGBA1C 4.8 04/12/2022    Lab Results  Component Value Date   TSH 1.01 12/04/2021   Lab Results  Component Value Date   WBC 4.7 05/14/2022   HGB 11.9 (L) 05/14/2022   HCT 37.7 05/14/2022   MCV 88.7 05/14/2022   PLT 277 05/14/2022   Lab Results  Component Value Date   NA 141 05/14/2022   K 4.4 05/14/2022   CO2 31 05/14/2022   GLUCOSE 124 (H) 05/14/2022   BUN 13 05/14/2022   CREATININE 0.92 05/14/2022   BILITOT 0.2 (L) 05/14/2022   ALKPHOS 31 (L) 05/14/2022   AST 14 (L) 05/14/2022   ALT 14 05/14/2022   PROT 6.9 05/14/2022   ALBUMIN 4.4 05/14/2022   CALCIUM 9.6 05/14/2022   ANIONGAP 7 05/14/2022   GFR 74.28 10/24/2021   Lab Results  Component Value Date   CHOL 200 07/04/2012   Lab Results  Component Value Date   HDL 61.30 07/04/2012   Lab Results  Component Value Date   LDLCALC 123 (H) 07/04/2012   Lab Results  Component Value Date   TRIG 77.0 07/04/2012   Lab Results  Component Value Date   CHOLHDL 3 07/04/2012   Lab Results  Component Value Date   HGBA1C 4.8 04/12/2022       Assessment & Plan:   Problem List Items Addressed This Visit       Unprioritized   Hypothyroidism    Lab Results  Component Value Date   TSH 1.01  12/04/2021  con't current dose synthroid  Will check once she is able to f/u after her chemo is done      Cancer of right colon [facility])    Per surgery and oncology      Attention deficit disorder    Stable Will do uds and contract after she is done with chemo con't meds--- ok to stop during chemo if she chooses       I have discontinued Georgina Peer B. Vi's fexofenadine, ibuprofen, Iron-Vitamin C, and phentermine. I am also having her maintain her albuterol, Trelegy Ellipta, famotidine, Synthroid, traZODone, methylphenidate, Polyethylene Glycol 3350 (MIRALAX PO), Docusate Sodium (COLACE PO), and capecitabine.  No orders of the defined types were placed in this encounter.   I discussed the assessment and treatment plan with the patient. The patient was provided an opportunity to ask questions and all were answered. The patient agreed with the plan and demonstrated an understanding of the instructions.   The patient was advised to call back or seek an in-person evaluation if the symptoms worsen or if the condition fails to improve as anticipated.     Ann Held, DO [place] [facility] [phone] (phone) [phone] (fax)  [place]

## 2022-05-25 NOTE — Assessment & Plan Note (Signed)
Per surgery and oncology

## 2022-05-25 NOTE — Assessment & Plan Note (Signed)
Stable Will do uds and contract after she is done with chemo con't meds--- ok to stop during chemo if she chooses

## 2022-05-30 ENCOUNTER — Encounter: Payer: Self-pay | Admitting: Dermatology

## 2022-05-31 ENCOUNTER — Other Ambulatory Visit: Payer: Self-pay | Admitting: Oncology

## 2022-05-31 DIAGNOSIS — C189 Malignant neoplasm of colon, unspecified: Secondary | ICD-10-CM

## 2022-06-01 ENCOUNTER — Encounter: Payer: Self-pay | Admitting: Oncology

## 2022-06-01 ENCOUNTER — Inpatient Hospital Stay: Payer: Commercial Managed Care - PPO | Admitting: Oncology

## 2022-06-01 ENCOUNTER — Inpatient Hospital Stay: Payer: Commercial Managed Care - PPO | Attending: Nurse Practitioner

## 2022-06-01 ENCOUNTER — Other Ambulatory Visit: Payer: Self-pay | Admitting: *Deleted

## 2022-06-01 VITALS — BP 134/84 | HR 80 | Temp 98.2°F | Resp 20 | Ht 66.5 in | Wt 158.8 lb

## 2022-06-01 DIAGNOSIS — C189 Malignant neoplasm of colon, unspecified: Secondary | ICD-10-CM | POA: Diagnosis not present

## 2022-06-01 DIAGNOSIS — C180 Malignant neoplasm of cecum: Secondary | ICD-10-CM | POA: Insufficient documentation

## 2022-06-01 DIAGNOSIS — E039 Hypothyroidism, unspecified: Secondary | ICD-10-CM | POA: Insufficient documentation

## 2022-06-01 DIAGNOSIS — J45909 Unspecified asthma, uncomplicated: Secondary | ICD-10-CM | POA: Insufficient documentation

## 2022-06-01 LAB — CBC WITH DIFFERENTIAL (CANCER CENTER ONLY)
Abs Immature Granulocytes: 0 10*3/uL (ref 0.00–0.07)
Basophils Absolute: 0 10*3/uL (ref 0.0–0.1)
Basophils Relative: 0 %
Eosinophils Absolute: 0.1 10*3/uL (ref 0.0–0.5)
Eosinophils Relative: 3 %
HCT: 36.8 % (ref 36.0–46.0)
Hemoglobin: 11.9 g/dL — ABNORMAL LOW (ref 12.0–15.0)
Immature Granulocytes: 0 %
Lymphocytes Relative: 31 %
Lymphs Abs: 1.3 10*3/uL (ref 0.7–4.0)
MCH: 28.7 pg (ref 26.0–34.0)
MCHC: 32.3 g/dL (ref 30.0–36.0)
MCV: 88.7 fL (ref 80.0–100.0)
Monocytes Absolute: 0.4 10*3/uL (ref 0.1–1.0)
Monocytes Relative: 10 %
Neutro Abs: 2.4 10*3/uL (ref 1.7–7.7)
Neutrophils Relative %: 56 %
Platelet Count: 328 10*3/uL (ref 150–400)
RBC: 4.15 MIL/uL (ref 3.87–5.11)
RDW: 15 % (ref 11.5–15.5)
WBC Count: 4.2 10*3/uL (ref 4.0–10.5)
nRBC: 0 % (ref 0.0–0.2)

## 2022-06-01 LAB — CMP (CANCER CENTER ONLY)
ALT: 15 U/L (ref 0–44)
AST: 16 U/L (ref 15–41)
Albumin: 4.4 g/dL (ref 3.5–5.0)
Alkaline Phosphatase: 31 U/L — ABNORMAL LOW (ref 38–126)
Anion gap: 6 (ref 5–15)
BUN: 14 mg/dL (ref 6–20)
CO2: 32 mmol/L (ref 22–32)
Calcium: 9.3 mg/dL (ref 8.9–10.3)
Chloride: 102 mmol/L (ref 98–111)
Creatinine: 0.87 mg/dL (ref 0.44–1.00)
GFR, Estimated: >60 mL/min (ref >60)
Glucose, Bld: 98 mg/dL (ref 70–99)
Potassium: 4.2 mmol/L (ref 3.5–5.1)
Sodium: 140 mmol/L (ref 135–145)
Total Bilirubin: 0.4 mg/dL (ref 0.3–1.2)
Total Protein: 6.9 g/dL (ref 6.5–8.1)

## 2022-06-01 MED ORDER — PROCHLORPERAZINE MALEATE 5 MG PO TABS: 5.0000 mg | ORAL_TABLET | Freq: Four times a day (QID) | ORAL | 3 refills | Status: DC | PRN

## 2022-06-01 NOTE — Progress Notes (Signed)
  [place] OFFICE PROGRESS NOTE   Diagnosis: Colon cancer  INTERVAL HISTORY:   Shelby Warren completed cycle 1 Xeloda beginning 05/14/2022.  No mouth sores or hand/foot pain.  She reports nausea during the last 3-4 days of treatment.  The nausea improved with increasing her diet and ondansetron.  She had 1 episode of diarrhea yesterday.  She has intermittent mild discomfort in the right abdomen.  She feels full in the right abdomen.  Objective:  Vital signs in last 24 hours:  Blood pressure 134/84, pulse 80, temperature 98.2 F (36.8 C), temperature source Oral, resp. rate 20, height 5' 6.5" (1.689 m), weight 158 lb 12.8 oz (72 kg), last menstrual period 07/31/2018, SpO2 100 %.    HEENT: No thrush or ulcers Resp: Lungs clear bilaterally Cardio: Regular rate and rhythm with an occasional premature beat GI: Soft and nontender, no hepatosplenomegaly Vascular: No leg edema  Skin: Palms and soles without erythema, callus formation at the soles   Lab Results:  Lab Results  Component Value Date   WBC 4.2 06/01/2022   HGB 11.9 (L) 06/01/2022   HCT 36.8 06/01/2022   MCV 88.7 06/01/2022   PLT 328 06/01/2022   NEUTROABS 2.4 06/01/2022    CMP  Lab Results  Component Value Date   NA 141 05/14/2022   K 4.4 05/14/2022   CL 103 05/14/2022   CO2 31 05/14/2022   GLUCOSE 124 (H) 05/14/2022   BUN 13 05/14/2022   CREATININE 0.92 05/14/2022   CALCIUM 9.6 05/14/2022   PROT 6.9 05/14/2022   ALBUMIN 4.4 05/14/2022   AST 14 (L) 05/14/2022   ALT 14 05/14/2022   ALKPHOS 31 (L) 05/14/2022   BILITOT 0.2 (L) 05/14/2022   GFRNONAA >60 05/14/2022    Lab Results  Component Value Date   CEA1 22.0 (H) 04/12/2022   CEA 1.83 05/14/2022    Medications: I have reviewed the patient's current medications.   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, stage IIa (T3N0), status post a right colectomy  04/12/2022  No macroscopic tumor perforation, negative resection margins 0/31 nodes, no tumor deposits, no lymphovascular perineural invasion, no loss of mismatch repair protein expression CT abdomen/pelvis 04/11/2022-cecal mass with obstruction of the ostium of the appendix with evidence of acute appendicitis, lymphadenopathy in the ileocolic mesentery CT chest 04/13/2022-no evidence of metastatic disease, pneumoperitoneum and trace bilateral pleural effusions felt to be secondary to surgery Elevated preoperative CEA; CEA in normal range 05/14/2022 (1.83) Cycle 1 Xeloda 05/14/2022 Cycle 2 Xeloda 06/04/2022   Asthma ADD G2 P2 Hysterectomy Hypothyroid History of an anal fissure History of a colon polyp-sigmoid colon polyp removed 05/29/2017-mucosal prolapse type polyp, melanosis coli, no dysplasia or malignancy    Disposition: Shelby Warren has completed 1 cycle of adjuvant Xeloda.  She tolerated the treatment well aside from nausea toward the end of this cycle.  I recommended she take Zofran prior to Xeloda.  We use Compazine as needed.  She will contact us if her nausea is not relieved.  She will return for an office and lab visit in 3 weeks.  Betsy Coder, MD  06/01/2022  8:30 AM

## 2022-06-08 ENCOUNTER — Other Ambulatory Visit: Payer: Self-pay | Admitting: *Deleted

## 2022-06-08 DIAGNOSIS — C189 Malignant neoplasm of colon, unspecified: Secondary | ICD-10-CM

## 2022-06-08 MED ORDER — CAPECITABINE 500 MG PO TABS: 1500.0000 mg | ORAL_TABLET | Freq: Two times a day (BID) | ORAL | 0 refills | Status: DC

## 2022-06-18 ENCOUNTER — Other Ambulatory Visit: Payer: Self-pay | Admitting: Family Medicine

## 2022-06-18 ENCOUNTER — Encounter: Payer: Self-pay | Admitting: Oncology

## 2022-06-18 ENCOUNTER — Other Ambulatory Visit: Payer: Self-pay | Admitting: Oncology

## 2022-06-18 ENCOUNTER — Other Ambulatory Visit: Payer: Self-pay | Admitting: *Deleted

## 2022-06-18 DIAGNOSIS — F988 Other specified behavioral and emotional disorders with onset usually occurring in childhood and adolescence: Secondary | ICD-10-CM

## 2022-06-18 DIAGNOSIS — C189 Malignant neoplasm of colon, unspecified: Secondary | ICD-10-CM

## 2022-06-18 NOTE — Telephone Encounter (Signed)
Left VM that Xeloda cycle was sent on 9/8 and confirmed w/pharmacy she should be receiving the medication tomorrow.

## 2022-06-19 MED ORDER — METHYLPHENIDATE HCL 10 MG PO TABS: 10.0000 mg | ORAL_TABLET | Freq: Three times a day (TID) | ORAL | 0 refills | Status: DC

## 2022-06-19 NOTE — Telephone Encounter (Signed)
Requesting: methylphenidate '10mg'$   Contract: 10/24/21 UDS: 10/24/21 Last Visit: 05/25/22 Next Visit: None Last Refill: 05/14/22 #90 and 0RF Pt sig: 1 tab tid   Please Advise

## 2022-06-20 ENCOUNTER — Encounter: Payer: Self-pay | Admitting: Gastroenterology

## 2022-06-20 ENCOUNTER — Ambulatory Visit: Payer: Commercial Managed Care - PPO | Admitting: Gastroenterology

## 2022-06-20 VITALS — BP 122/78 | HR 88 | Wt 160.0 lb

## 2022-06-20 DIAGNOSIS — C180 Malignant neoplasm of cecum: Secondary | ICD-10-CM

## 2022-06-20 MED ORDER — NA SULFATE-K SULFATE-MG SULF 17.5-3.13-1.6 GM/177ML PO SOLN: 1.0000 | Freq: Once | ORAL | 0 refills | Status: AC

## 2022-06-20 NOTE — Patient Instructions (Signed)
If you are age 55 or older, your body mass index should be between 23-30. Your Body mass index is 25.44 kg/m. If this is out of the aforementioned range listed, please consider follow up with your Primary Care Provider.  If you are age 70 or younger, your body mass index should be between 19-25. Your Body mass index is 25.44 kg/m. If this is out of the aformentioned range listed, please consider follow up with your Primary Care Provider.   You have been scheduled for a colonoscopy. Please follow written instructions given to you at your visit today.  Please pick up your prep supplies at the pharmacy within the next 1-3 days. If you use inhalers (even only as needed), please bring them with you on the day of your procedure.  The [facility] providers would like to encourage you to use [facility] to communicate with providers for non-urgent requests or questions.  Due to long hold times on the telephone, sending your provider a message by [facility] may be a faster and more efficient way to get a response.  Please allow 48 business hours for a response.  Please remember that this is for non-urgent requests.   It was a pleasure to see you today!  Thank you for trusting me with your gastrointestinal care!    Scott E.Candis Schatz, MD

## 2022-06-20 NOTE — Progress Notes (Signed)
HPI : Shelby Warren is a very pleasant 56 year old female who was recently diagnosed with colon cancer after presenting with signs/symptoms of acute appendicitis, referred to [place] by Dr. Michaelle Birks for colonoscopy. The patient underwent her initial average risk screening colonoscopy in 2018 and no precancerous polyps were seen. She presented to the ED July 12 with RLQ pain and CT findings consistent with acute appendicitis and suspected cecal mass.  She underwent a laparoscopic R hemicolectomy on July 13th and pathology revealed moderately differentiated adenocarcinoma of the cecum involving the T.I and appendix (Stage IIa, T3N0).  She was started on Xeloda on August 14, with plans for 8 cycles.  She has been tolerating the treatments well with mild nausea, but no other significant effects. She denies any problems following her surgery.  She has regular bowel movements, no problems with diarrhea or incontinence.  Her stools are formed and brown with no blood. Her appetite is good and her weight is stable.   Past Medical History:  Diagnosis Date   ADD (attention deficit disorder)    Allergic rhinitis    Asthma    B12 deficiency    Hypothyroidism    Colonoscopy 2018:  Dr. Collene Mares, no adenomas  Past Surgical History:  Procedure Laterality Date   ANAL FISSURE REPAIR     APPENDECTOMY N/A 04/12/2022   Procedure: APPENDECTOMY;  Surgeon: Dwan Bolt, MD;  Location: [place] [facility];  Service: General;  Laterality: N/A;   LAPAROSCOPIC RIGHT HEMI COLECTOMY N/A 04/12/2022   Procedure: LAPAROSCOPIC RIGHT HEMI COLECTOMY AND TERMINAL ILEUM;  Surgeon: Dwan Bolt, MD;  Location: [place];  Service: General;  Laterality: N/A;   lasic eye surgery     LIVER BIOPSY Right 04/12/2022   Procedure: LIVER BIOPSY;  Surgeon: Dwan Bolt, MD;  Location: [place];  Service: General;  Laterality: Right;   spincture muscle     TOTAL ABDOMINAL HYSTERECTOMY  07/04/2020   dr Helane Rima   Family History  Problem Relation Age of  Onset   Breast cancer Paternal Grandmother    Thyroid disease Paternal Grandmother    Diabetes Maternal Grandfather    Social History   Tobacco Use   Smoking status: Never   Smokeless tobacco: Never  Vaping Use   Vaping Use: Never used  Substance Use Topics   Alcohol use: Yes    Comment: occasional    Drug use: No   Current Outpatient Medications  Medication Sig Dispense Refill   albuterol (VENTOLIN HFA) 108 (90 Base) MCG/ACT inhaler Inhale 2 puffs into the lungs every 6 (six) hours as needed for wheezing or shortness of breath. 18 g 5   capecitabine (XELODA) 500 MG tablet Take 3 tablets (1,500 mg total) by mouth 2 (two) times daily after a meal. Take for 14 days, then hold for 7 days. Repeat every 21 days. Start cycle on 06/25/22 84 tablet 0   Docusate Sodium (COLACE PO) Take 3 capsules by mouth daily.     famotidine (PEPCID) 40 MG tablet TAKE 1 TABLET(40 MG) BY MOUTH TWICE DAILY 180 tablet 1   Fluticasone-Umeclidin-Vilant (TRELEGY ELLIPTA) 100-62.5-25 MCG/ACT AEPB 1 inh qd 1 each 11   methylphenidate (RITALIN) 10 MG tablet Take 1 tablet (10 mg total) by mouth 3 (three) times daily with meals. 90 tablet 0   Polyethylene Glycol 3350 (MIRALAX PO) Take 17 g by mouth daily.     prochlorperazine (COMPAZINE) 5 MG tablet Take 1-2 tablets (5-10 mg total) by mouth every 6 (six)  hours as needed for nausea or vomiting. 30 tablet 3   SYNTHROID 175 MCG tablet TAKE 1 TABLET(175 MCG) BY MOUTH DAILY 30 tablet 2   traZODone (DESYREL) 100 MG tablet TAKE 1 TABLET(100 MG) BY MOUTH AT BEDTIME 90 tablet 0   No current facility-administered medications for this visit.   Allergies  Allergen Reactions   Penicillins Hives and Swelling   Fish-Derived Products    Penicillin G Other (See Comments)   Strawberry Extract      Review of Systems: All systems reviewed and negative except where noted in HPI.    No results found.  Physical Exam: BP 122/78   Pulse 88   Wt 160 lb (72.6 kg)   LMP  07/31/2018   BMI 25.44 kg/m  Constitutional: Pleasant,well-developed, Caucasian female in no acute distress.  Accompanied by husband HEENT: Normocephalic and atraumatic. Conjunctivae are normal. No scleral icterus. Neck supple.  Cardiovascular: Normal rate, regular rhythm.  Pulmonary/chest: Effort normal and breath sounds normal. No wheezing, rales or rhonchi. Abdominal: Soft, nondistended, mild TTP in the RLQ.  Healed lap port incisions.  Bowel sounds active throughout. There are no masses palpable. No hepatomegaly. Extremities: no edema Neurological: Alert and oriented to person place and time. Skin: Skin is warm and dry. No rashes noted. Psychiatric: Normal mood and affect. Behavior is normal.  CBC    Component Value Date/Time   WBC 4.2 06/01/2022 0740   WBC 10.2 04/13/2022 0131   RBC 4.15 06/01/2022 0740   HGB 11.9 (L) 06/01/2022 0740   HCT 36.8 06/01/2022 0740   PLT 328 06/01/2022 0740   MCV 88.7 06/01/2022 0740   MCH 28.7 06/01/2022 0740   MCHC 32.3 06/01/2022 0740   RDW 15.0 06/01/2022 0740   LYMPHSABS 1.3 06/01/2022 0740   MONOABS 0.4 06/01/2022 0740   EOSABS 0.1 06/01/2022 0740   BASOSABS 0.0 06/01/2022 0740    CMP     Component Value Date/Time   NA 140 06/01/2022 0740   K 4.2 06/01/2022 0740   CL 102 06/01/2022 0740   CO2 32 06/01/2022 0740   GLUCOSE 98 06/01/2022 0740   BUN 14 06/01/2022 0740   CREATININE 0.87 06/01/2022 0740   CALCIUM 9.3 06/01/2022 0740   PROT 6.9 06/01/2022 0740   ALBUMIN 4.4 06/01/2022 0740   AST 16 06/01/2022 0740   ALT 15 06/01/2022 0740   ALKPHOS 31 (L) 06/01/2022 0740   BILITOT 0.4 06/01/2022 0740   GFRNONAA >60 06/01/2022 0740     ASSESSMENT AND PLAN: 56 year old female with recently diagnosed colon cancer (stage IIA, T3N0), presenting as appendicitis, s/p R hemicolectomy and undergoing adjuvant Xeloda.  Her last colonoscopy was in 2018 and was normal.  Given new onset  interval colon cancer, a repeat colonoscopy is warranted  to assess for synchronous lesions.  MSI testing normal. Will schedule colonoscopy.  Plan for surveillance colonoscopy one year later assuming no other high risk lesions found.  Colon cancer s/p R hemicolectomy - Colonoscopy to r/o synchronous lesions  The details, risks (including bleeding, perforation, infection, missed lesions, medication reactions and possible hospitalization or surgery if complications occur), benefits, and alternatives to colonoscopy with possible biopsy and possible polypectomy were discussed with the patient and she consents to proceed.   Yukio Bisping E. Candis Schatz, MD [facility]   CC:  Carollee Herter, Alferd Apa, *

## 2022-06-21 ENCOUNTER — Inpatient Hospital Stay: Payer: Commercial Managed Care - PPO | Admitting: Nurse Practitioner

## 2022-06-21 ENCOUNTER — Encounter: Payer: Self-pay | Admitting: Nurse Practitioner

## 2022-06-21 ENCOUNTER — Inpatient Hospital Stay: Payer: Commercial Managed Care - PPO

## 2022-06-21 VITALS — BP 140/80 | HR 81 | Temp 98.1°F | Resp 18 | Ht 66.5 in | Wt 159.6 lb

## 2022-06-21 DIAGNOSIS — C189 Malignant neoplasm of colon, unspecified: Secondary | ICD-10-CM

## 2022-06-21 DIAGNOSIS — C180 Malignant neoplasm of cecum: Secondary | ICD-10-CM | POA: Diagnosis not present

## 2022-06-21 LAB — CBC WITH DIFFERENTIAL (CANCER CENTER ONLY)
Abs Immature Granulocytes: 0.02 10*3/uL (ref 0.00–0.07)
Basophils Absolute: 0 10*3/uL (ref 0.0–0.1)
Basophils Relative: 0 %
Eosinophils Absolute: 0.1 10*3/uL (ref 0.0–0.5)
Eosinophils Relative: 3 %
HCT: 36.6 % (ref 36.0–46.0)
Hemoglobin: 12 g/dL (ref 12.0–15.0)
Immature Granulocytes: 0 %
Lymphocytes Relative: 30 %
Lymphs Abs: 1.7 10*3/uL (ref 0.7–4.0)
MCH: 29.1 pg (ref 26.0–34.0)
MCHC: 32.8 g/dL (ref 30.0–36.0)
MCV: 88.6 fL (ref 80.0–100.0)
Monocytes Absolute: 0.4 10*3/uL (ref 0.1–1.0)
Monocytes Relative: 6 %
Neutro Abs: 3.5 10*3/uL (ref 1.7–7.7)
Neutrophils Relative %: 61 %
Platelet Count: 375 10*3/uL (ref 150–400)
RBC: 4.13 MIL/uL (ref 3.87–5.11)
RDW: 18.1 % — ABNORMAL HIGH (ref 11.5–15.5)
WBC Count: 5.7 10*3/uL (ref 4.0–10.5)
nRBC: 0 % (ref 0.0–0.2)

## 2022-06-21 LAB — CMP (CANCER CENTER ONLY)
ALT: 18 U/L (ref 0–44)
AST: 16 U/L (ref 15–41)
Albumin: 4.5 g/dL (ref 3.5–5.0)
Alkaline Phosphatase: 32 U/L — ABNORMAL LOW (ref 38–126)
Anion gap: 6 (ref 5–15)
BUN: 12 mg/dL (ref 6–20)
CO2: 31 mmol/L (ref 22–32)
Calcium: 10.2 mg/dL (ref 8.9–10.3)
Chloride: 103 mmol/L (ref 98–111)
Creatinine: 0.81 mg/dL (ref 0.44–1.00)
GFR, Estimated: >60 mL/min (ref >60)
Glucose, Bld: 106 mg/dL — ABNORMAL HIGH (ref 70–99)
Potassium: 4 mmol/L (ref 3.5–5.1)
Sodium: 140 mmol/L (ref 135–145)
Total Bilirubin: 0.4 mg/dL (ref 0.3–1.2)
Total Protein: 7.3 g/dL (ref 6.5–8.1)

## 2022-06-21 NOTE — Progress Notes (Signed)
  [place] OFFICE PROGRESS NOTE   Diagnosis: Colon cancer  INTERVAL HISTORY:   Shelby Warren returns as scheduled.  She completed cycle 2 adjuvant Xeloda beginning 06/04/2022.  She denies significant nausea.  She notes eating frequently helps.  No mouth sores.  No diarrhea.  On day 14 she noted redness over the palms.  This resolved over the course of a few days.  Hands and feet are dry.  No significant pain/discomfort.  Objective:  Vital signs in last 24 hours:  Blood pressure (!) 140/80, pulse 81, temperature 98.1 F (36.7 C), temperature source Oral, resp. rate 18, height 5' 6.5" (1.689 m), weight 159 lb 9.6 oz (72.4 kg), last menstrual period 07/31/2018, SpO2 100 %.    HEENT: No thrush or ulcers. Resp: Lungs clear bilaterally. Cardio: Regular rate and rhythm. GI: Abdomen soft and nontender.  No hepatosplenomegaly. Vascular: No leg edema. Skin: Palms and soles with mild erythema, dry appearing.  Soles with callus formation.   Lab Results:  Lab Results  Component Value Date   WBC 5.7 06/21/2022   HGB 12.0 06/21/2022   HCT 36.6 06/21/2022   MCV 88.6 06/21/2022   PLT 375 06/21/2022   NEUTROABS 3.5 06/21/2022    Imaging:  No results found.  Medications: I have reviewed the patient's current medications.  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, stage IIa (T3N0), status post a right colectomy 04/12/2022  No macroscopic tumor perforation, negative resection margins 0/31 nodes, no tumor deposits, no lymphovascular perineural invasion, no loss of mismatch repair protein expression CT abdomen/pelvis 04/11/2022-cecal mass with obstruction of the ostium of the appendix with evidence of acute appendicitis, lymphadenopathy in the ileocolic mesentery CT chest 04/13/2022-no evidence of metastatic disease, pneumoperitoneum and trace bilateral pleural effusions felt to be secondary to  surgery Elevated preoperative CEA; CEA in normal range 05/14/2022 (1.83) Cycle 1 Xeloda 05/14/2022 Cycle 2 Xeloda 06/04/2022 Cycle 3 Xeloda 06/25/2022   Asthma ADD G2 P2 Hysterectomy Hypothyroid History of an anal fissure History of a colon polyp-sigmoid colon polyp removed 05/29/2017-mucosal prolapse type polyp, melanosis coli, no dysplasia or malignancy  Disposition: [name] appears stable.  She has completed 2 cycles of adjuvant Xeloda.  Overall she is tolerating well.  Plan to proceed with cycle 3 as scheduled 06/25/2022.  CBC reviewed.  Counts adequate to proceed as above.  She has mild hand-foot syndrome.  She knows to contact the office if symptoms worsen.  She will return for lab and follow-up in 3 months.  We are available to see her sooner if needed.    Ned Card ANP/GNP-BC   06/21/2022  2:44 PM

## 2022-06-29 ENCOUNTER — Other Ambulatory Visit: Payer: Self-pay | Admitting: Family Medicine

## 2022-07-03 ENCOUNTER — Other Ambulatory Visit: Payer: Self-pay | Admitting: Oncology

## 2022-07-03 DIAGNOSIS — C189 Malignant neoplasm of colon, unspecified: Secondary | ICD-10-CM

## 2022-07-05 ENCOUNTER — Encounter: Payer: Self-pay | Admitting: Gastroenterology

## 2022-07-10 ENCOUNTER — Other Ambulatory Visit: Payer: Self-pay | Admitting: Oncology

## 2022-07-10 DIAGNOSIS — C189 Malignant neoplasm of colon, unspecified: Secondary | ICD-10-CM

## 2022-07-11 ENCOUNTER — Inpatient Hospital Stay (HOSPITAL_BASED_OUTPATIENT_CLINIC_OR_DEPARTMENT_OTHER): Payer: Commercial Managed Care - PPO | Admitting: Nurse Practitioner

## 2022-07-11 ENCOUNTER — Inpatient Hospital Stay: Payer: Commercial Managed Care - PPO | Attending: Nurse Practitioner

## 2022-07-11 ENCOUNTER — Encounter: Payer: Self-pay | Admitting: Nurse Practitioner

## 2022-07-11 VITALS — BP 132/82 | HR 83 | Temp 98.1°F | Resp 20 | Ht 66.0 in | Wt 162.8 lb

## 2022-07-11 DIAGNOSIS — C182 Malignant neoplasm of ascending colon: Secondary | ICD-10-CM

## 2022-07-11 DIAGNOSIS — C189 Malignant neoplasm of colon, unspecified: Secondary | ICD-10-CM

## 2022-07-11 DIAGNOSIS — C180 Malignant neoplasm of cecum: Secondary | ICD-10-CM | POA: Insufficient documentation

## 2022-07-11 DIAGNOSIS — E039 Hypothyroidism, unspecified: Secondary | ICD-10-CM | POA: Insufficient documentation

## 2022-07-11 LAB — CBC WITH DIFFERENTIAL (CANCER CENTER ONLY)
Abs Immature Granulocytes: 0.02 10*3/uL (ref 0.00–0.07)
Basophils Absolute: 0 10*3/uL (ref 0.0–0.1)
Basophils Relative: 0 %
Eosinophils Absolute: 0.2 10*3/uL (ref 0.0–0.5)
Eosinophils Relative: 4 %
HCT: 34.8 % — ABNORMAL LOW (ref 36.0–46.0)
Hemoglobin: 11.6 g/dL — ABNORMAL LOW (ref 12.0–15.0)
Immature Granulocytes: 0 %
Lymphocytes Relative: 36 %
Lymphs Abs: 1.6 10*3/uL (ref 0.7–4.0)
MCH: 30.5 pg (ref 26.0–34.0)
MCHC: 33.3 g/dL (ref 30.0–36.0)
MCV: 91.6 fL (ref 80.0–100.0)
Monocytes Absolute: 0.4 10*3/uL (ref 0.1–1.0)
Monocytes Relative: 9 %
Neutro Abs: 2.3 10*3/uL (ref 1.7–7.7)
Neutrophils Relative %: 51 %
Platelet Count: 317 10*3/uL (ref 150–400)
RBC: 3.8 MIL/uL — ABNORMAL LOW (ref 3.87–5.11)
RDW: 21 % — ABNORMAL HIGH (ref 11.5–15.5)
WBC Count: 4.5 10*3/uL (ref 4.0–10.5)
nRBC: 0 % (ref 0.0–0.2)

## 2022-07-11 LAB — CMP (CANCER CENTER ONLY)
ALT: 18 U/L (ref 0–44)
AST: 16 U/L (ref 15–41)
Albumin: 4.3 g/dL (ref 3.5–5.0)
Alkaline Phosphatase: 32 U/L — ABNORMAL LOW (ref 38–126)
Anion gap: 6 (ref 5–15)
BUN: 9 mg/dL (ref 6–20)
CO2: 30 mmol/L (ref 22–32)
Calcium: 9.4 mg/dL (ref 8.9–10.3)
Chloride: 101 mmol/L (ref 98–111)
Creatinine: 0.95 mg/dL (ref 0.44–1.00)
GFR, Estimated: >60 mL/min (ref >60)
Glucose, Bld: 98 mg/dL (ref 70–99)
Potassium: 4 mmol/L (ref 3.5–5.1)
Sodium: 137 mmol/L (ref 135–145)
Total Bilirubin: 0.6 mg/dL (ref 0.3–1.2)
Total Protein: 7.3 g/dL (ref 6.5–8.1)

## 2022-07-11 MED ORDER — DICLOFENAC SODIUM 1 % EX GEL: CUTANEOUS | 2 refills | Status: DC

## 2022-07-11 NOTE — Progress Notes (Signed)
  [place] OFFICE PROGRESS NOTE   Diagnosis: Colon cancer  INTERVAL HISTORY:   Shelby Warren returns as scheduled.  She completed cycle 3 adjuvant Xeloda beginning 06/25/2022.  She again had nausea, relieved with food.  No vomiting.  No mouth sores.  No diarrhea.  Hands became red and painful with breaks in the skin, most pronounced on day 12.  Some peeling on the feet with callus formation.  Objective:  Vital signs in last 24 hours:  Blood pressure 132/82, pulse 83, temperature 98.1 F (36.7 C), temperature source Oral, resp. rate 20, height $RemoveBe'5\' 6"'psZNciXnD$  (1.676 m), weight 162 lb 12.8 oz (73.8 kg), last menstrual period 07/31/2018, SpO2 100 %.    HEENT: No thrush or ulcers. Resp: Lungs clear bilaterally. Cardio: Regular rate and rhythm. GI: No hepatosplenomegaly. Vascular: No leg edema. Skin: Palms are dry, scattered areas of peeling.  Soles with mild dryness.   Lab Results:  Lab Results  Component Value Date   WBC 4.5 07/11/2022   HGB 11.6 (L) 07/11/2022   HCT 34.8 (L) 07/11/2022   MCV 91.6 07/11/2022   PLT 317 07/11/2022   NEUTROABS 2.3 07/11/2022    Imaging:  No results found.  Medications: I have reviewed the patient's current medications.  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, stage IIa (T3N0), status post a right colectomy 04/12/2022  No macroscopic tumor perforation, negative resection margins 0/31 nodes, no tumor deposits, no lymphovascular perineural invasion, no loss of mismatch repair protein expression CT abdomen/pelvis 04/11/2022-cecal mass with obstruction of the ostium of the appendix with evidence of acute appendicitis, lymphadenopathy in the ileocolic mesentery CT chest 04/13/2022-no evidence of metastatic disease, pneumoperitoneum and trace bilateral pleural effusions felt to be secondary to surgery Elevated preoperative CEA; CEA in normal range 05/14/2022  (1.83) Cycle 1 Xeloda 05/14/2022 Cycle 2 Xeloda 06/04/2022 Cycle 3 Xeloda 06/25/2022 Cycle 4 Xeloda 07/16/2022   Asthma ADD G2 P2 Hysterectomy Hypothyroid History of an anal fissure History of a colon polyp-sigmoid colon polyp removed 05/29/2017-mucosal prolapse type polyp, melanosis coli, no dysplasia or malignancy  Disposition: Shelby Warren appears stable.  She has completed 3 cycles of adjuvant Xeloda.  She developed worsening hand-foot symptoms with redness, pain, peeling during the course of cycle 3.  Symptoms have improved during the break.  We discussed delaying cycle 4 with a dose reduction versus trying Voltaren gel 1% twice daily and maintaining the current dose.  She would like to try to maintain the current dose/schedule.  She will begin Voltaren gel 1% applying a small amount to the palms and soles twice daily.  She will begin cycle 4 as scheduled beginning 07/16/2022.  She understands to discontinue Xeloda and contact the office with worsening hand/foot symptoms.  CBC and chemistry panel reviewed.  Labs adequate to proceed as above.  She will return for follow-up in 3 weeks.  Patient seen with Dr. Benay Spice.    Ned Card ANP/GNP-BC   07/11/2022  8:16 AM  This was a shared visit with Ned Card.  Shelby Warren has hand/foot syndrome secondary to Xeloda.  She agrees to a trial of Voltaren gel in an attempt to diminish the hand/foot symptoms.  She will begin cycle 4 Xeloda on 07/16/2022.  I was present for greater than 50% today's visit.  I performed medical decision making.  Julieanne Manson, MD

## 2022-07-12 ENCOUNTER — Encounter: Payer: Self-pay | Admitting: Gastroenterology

## 2022-07-12 ENCOUNTER — Ambulatory Visit: Payer: Commercial Managed Care - PPO | Admitting: Gastroenterology

## 2022-07-12 VITALS — BP 141/94 | HR 79 | Temp 98.8°F | Ht 66.0 in | Wt 160.0 lb

## 2022-07-12 DIAGNOSIS — C180 Malignant neoplasm of cecum: Secondary | ICD-10-CM

## 2022-07-12 MED ORDER — SODIUM CHLORIDE 0.9 % IV SOLN: 500.0000 mL | Freq: Once | INTRAVENOUS | Status: DC

## 2022-07-12 NOTE — Progress Notes (Signed)
Patient rescheduled for 10/31 at 7:30 am.  Instructions printed and gone over with the patient.  Patient verbalized understanding.  Plenvu prep sample provided LOT# [id number] EXP 11/2022

## 2022-07-13 NOTE — Progress Notes (Signed)
Patient was scheduled for colonoscopy today, but she took phentermine yesterday. Per facility policy and anesthesia guidelines, her procedure will need to be cancelled. Phentermine was not listed as one of her medications when she was seen in the office prior to her colonoscopy which is why she did not receive instructions to hold the phentermine. She was rescheduled for a colonoscopy later this month and is aware of the need to hold her phentermine.

## 2022-07-17 ENCOUNTER — Other Ambulatory Visit: Payer: Self-pay | Admitting: Family Medicine

## 2022-07-17 DIAGNOSIS — F988 Other specified behavioral and emotional disorders with onset usually occurring in childhood and adolescence: Secondary | ICD-10-CM

## 2022-07-17 NOTE — Telephone Encounter (Signed)
Requesting: Ritalin '10mg'$   Contract:10/24/21 UDS: 10/24/21 Last Visit: 05/25/22 Next Visit: None Last Refill: 06/19/22 #90 and 0RF   Please Advise

## 2022-07-18 MED ORDER — METHYLPHENIDATE HCL 10 MG PO TABS: 10.0000 mg | ORAL_TABLET | Freq: Three times a day (TID) | ORAL | 0 refills | Status: DC

## 2022-07-25 ENCOUNTER — Other Ambulatory Visit: Payer: Self-pay | Admitting: Oncology

## 2022-07-25 DIAGNOSIS — C189 Malignant neoplasm of colon, unspecified: Secondary | ICD-10-CM

## 2022-07-25 NOTE — Telephone Encounter (Signed)
Patient will be in the office on 08/02/22 then the provider will refill

## 2022-07-26 ENCOUNTER — Encounter: Payer: Self-pay | Admitting: Family Medicine

## 2022-07-31 ENCOUNTER — Other Ambulatory Visit: Payer: Self-pay | Admitting: Oncology

## 2022-07-31 ENCOUNTER — Encounter: Payer: Self-pay | Admitting: Oncology

## 2022-07-31 ENCOUNTER — Ambulatory Visit (AMBULATORY_SURGERY_CENTER): Payer: Commercial Managed Care - PPO | Admitting: Gastroenterology

## 2022-07-31 ENCOUNTER — Encounter: Payer: Self-pay | Admitting: Gastroenterology

## 2022-07-31 VITALS — BP 125/82 | HR 70 | Temp 98.6°F | Resp 14 | Ht 66.5 in | Wt 160.0 lb

## 2022-07-31 DIAGNOSIS — Z08 Encounter for follow-up examination after completed treatment for malignant neoplasm: Secondary | ICD-10-CM | POA: Diagnosis present

## 2022-07-31 DIAGNOSIS — C189 Malignant neoplasm of colon, unspecified: Secondary | ICD-10-CM

## 2022-07-31 DIAGNOSIS — C180 Malignant neoplasm of cecum: Secondary | ICD-10-CM

## 2022-07-31 DIAGNOSIS — Z85038 Personal history of other malignant neoplasm of large intestine: Secondary | ICD-10-CM | POA: Diagnosis not present

## 2022-07-31 MED ORDER — SODIUM CHLORIDE 0.9 % IV SOLN: 500.0000 mL | Freq: Once | INTRAVENOUS | Status: DC

## 2022-07-31 NOTE — Op Note (Signed)
[place] Patient Name: Shelby Warren Procedure Date: 07/31/2022 7:30 AM MRN: [id number] Endoscopist: Nicki Reaper E. Candis Schatz , MD, [id number] Age: 56 Referring MD:  Date of Birth: [birth year] Gender: Female Account #: [id number] Procedure:                Colonoscopy Indications:              High risk colon cancer surveillance: Personal                            history of colon cancer (Diagnosed in July 2023                            after presenting with appendicitis, s/p R                            hemicolectomy), colonoscopy to exclude synchronous                            lesions. Medicines:                Monitored Anesthesia Care Procedure:                Pre-Anesthesia Assessment:                           - Prior to the procedure, a History and Physical                            was performed, and patient medications and                            allergies were reviewed. The patient's tolerance of                            previous anesthesia was also reviewed. The risks                            and benefits of the procedure and the sedation                            options and risks were discussed with the patient.                            All questions were answered, and informed consent                            was obtained. Prior Anticoagulants: The patient has                            taken no anticoagulant or antiplatelet agents. ASA                            Grade Assessment: II - A patient with mild systemic  disease. After reviewing the risks and benefits,                            the patient was deemed in satisfactory condition to                            undergo the procedure.                           After obtaining informed consent, the colonoscope                            was passed under direct vision. Throughout the                            procedure, the patient's blood pressure, pulse, and                             oxygen saturations were monitored continuously. The                            Olympus PCF-H190DL (#[id number]) Colonoscope was                            introduced through the anus and advanced to the the                            ileocolonic anastomosis. The colonoscopy was                            performed without difficulty. The patient tolerated                            the procedure well. The quality of the bowel                            preparation was adequate. Ileocolonic anastomosis,                            neoterminal ileum, rectum were photographed. The                            bowel preparation used was SUPREP via split dose                            instruction. Scope In: 7:56:35 AM Scope Out: 8:11:14 AM Scope Withdrawal Time: 0 hours 9 minutes 35 seconds  Total Procedure Duration: 0 hours 14 minutes 39 seconds  Findings:                 The perianal and digital rectal examinations were                            normal. Pertinent negatives include normal  sphincter tone and no palpable rectal lesions.                           A diffuse area of moderate melanosis was found in                            the entire colon.                           There was evidence of a prior side-to-side                            ileo-colonic anastomosis in the ascending colon.                            This was patent and was characterized by healthy                            appearing mucosa. The anastomosis was traversed.                           The exam was otherwise normal throughout the                            examined colon.                           The neo-terminal ileum appeared normal.                           The retroflexed view of the distal rectum and anal                            verge was normal and showed no anal or rectal                            abnormalities. Complications:            No immediate  complications. Estimated Blood Loss:     Estimated blood loss: none. Impression:               - Melanosis in the colon.                           - Patent side-to-side ileo-colonic anastomosis,                            characterized by healthy appearing mucosa.                           - The examined portion of the ileum was normal.                           - The distal rectum and anal verge are normal on  retroflexion view.                           - No specimens collected. Recommendation:           - Patient has a contact number available for                            emergencies. The signs and symptoms of potential                            delayed complications were discussed with the                            patient. Return to normal activities tomorrow.                            Written discharge instructions were provided to the                            patient.                           - Resume previous diet.                           - Continue present medications.                           - Repeat colonoscopy in 1 year for surveillance. Shelby Warren E. Candis Schatz, MD 07/31/2022 8:22:04 AM This report has been signed electronically.

## 2022-07-31 NOTE — Patient Instructions (Signed)
Please read handouts provided. Continue present medications. Repeat colonoscopy in 1 year for screening.   YOU HAD AN ENDOSCOPIC PROCEDURE TODAY AT [facility]:   Refer to the procedure report that was given to you for any specific questions about what was found during the examination.  If the procedure report does not answer your questions, please call your gastroenterologist to clarify.  If you requested that your care partner not be given the details of your procedure findings, then the procedure report has been included in a sealed envelope for you to review at your convenience later.  YOU SHOULD EXPECT: Some feelings of bloating in the abdomen. Passage of more gas than usual.  Walking can help get rid of the air that was put into your GI tract during the procedure and reduce the bloating. If you had a lower endoscopy (such as a colonoscopy or flexible sigmoidoscopy) you may notice spotting of blood in your stool or on the toilet paper. If you underwent a bowel prep for your procedure, you may not have a normal bowel movement for a few days.  Please Note:  You might notice some irritation and congestion in your nose or some drainage.  This is from the oxygen used during your procedure.  There is no need for concern and it should clear up in a day or so.  SYMPTOMS TO REPORT IMMEDIATELY:  Following lower endoscopy (colonoscopy or flexible sigmoidoscopy):  Excessive amounts of blood in the stool  Significant tenderness or worsening of abdominal pains  Swelling of the abdomen that is new, acute  Fever of 100F or higher.  For urgent or emergent issues, a gastroenterologist can be reached at any hour by calling [phone]. Do not use MyChart messaging for urgent concerns.    DIET:  We do recommend a small meal at first, but then you may proceed to your regular diet.  Drink plenty of fluids but you should avoid alcoholic beverages for 24 hours.  ACTIVITY:  You should plan  to take it easy for the rest of today and you should NOT DRIVE or use heavy machinery until tomorrow (because of the sedation medicines used during the test).    FOLLOW UP: Our staff will call the number listed on your records the next business day following your procedure.  We will call around 7:15- 8:00 am to check on you and address any questions or concerns that you may have regarding the information given to you following your procedure. If we do not reach you, we will leave a message.     If any biopsies were taken you will be contacted by phone or by letter within the next 1-3 weeks.  Please call us at [phone] if you have not heard about the biopsies in 3 weeks.    SIGNATURES/CONFIDENTIALITY: You and/or your care partner have signed paperwork which will be entered into your electronic medical record.  These signatures attest to the fact that that the information above on your After Visit Summary has been reviewed and is understood.  Full responsibility of the confidentiality of this discharge information lies with you and/or your care-partner.

## 2022-07-31 NOTE — Progress Notes (Signed)
Report to PACU, RN, vss, BBS= Clear.  

## 2022-07-31 NOTE — Progress Notes (Signed)
[facility] History and Physical   Primary Care Physician:  Carollee Herter, Alferd Apa, DO   Reason for Procedure:   Colon cancer surveillance  Plan:    Surveillance colonoscopy     HPI: Shelby Warren is a 56 y.o. female undergoing colonoscopy following a recent diagnosis of colon cancer after she presented with appendicitis (April 11, 2022).  She underwent a R hemicolectomy and is being treated with Xeloda.  Colonoscopy requested by her surgeon to exclude synchronous lesions.  She had a small sigmoid polyp in 2018.  Past Medical History:  Diagnosis Date   ADD (attention deficit disorder)    Allergic rhinitis    Asthma    B12 deficiency    Cancer ([place])    Hypothyroidism     Past Surgical History:  Procedure Laterality Date   ANAL FISSURE REPAIR     APPENDECTOMY N/A 04/12/2022   Procedure: APPENDECTOMY;  Surgeon: Dwan Bolt, MD;  Location: [place] [facility];  Service: General;  Laterality: N/A;   LAPAROSCOPIC RIGHT HEMI COLECTOMY N/A 04/12/2022   Procedure: LAPAROSCOPIC RIGHT HEMI COLECTOMY AND TERMINAL ILEUM;  Surgeon: Dwan Bolt, MD;  Location: [facility];  Service: General;  Laterality: N/A;   lasic eye surgery     LIVER BIOPSY Right 04/12/2022   Procedure: LIVER BIOPSY;  Surgeon: Dwan Bolt, MD;  Location: [facility];  Service: General;  Laterality: Right;   spincture muscle     TOTAL ABDOMINAL HYSTERECTOMY  07/04/2020   dr Helane Rima    Prior to Admission medications   Medication Sig Start Date End Date Taking? Authorizing Provider  capecitabine (XELODA) 500 MG tablet Take 3 tablets (1,500 mg total) by mouth 2 (two) times daily after a meal. Take x 14 days on, then 7 days off. Repeat cycle every 21 days. Start cycle on 11/06 07/27/22  Yes Ladell Pier, MD  diclofenac Sodium (VOLTAREN) 1 % GEL Apply small amount to palms and soles twice a day 07/11/22  Yes Owens Shark, NP  Docusate Sodium (COLACE PO) Take 3 capsules by mouth daily.   Yes [provider]   famotidine (PEPCID) 40 MG tablet TAKE 1 TABLET(40 MG) BY MOUTH TWICE DAILY 01/08/22  Yes Lowne Lyndal Pulley R, DO  fexofenadine (ALLEGRA) 180 MG tablet Take 180 mg by mouth daily.   Yes [provider]  Fluticasone-Umeclidin-Vilant (TRELEGY ELLIPTA) 100-62.5-25 MCG/ACT AEPB 1 inh qd 12/04/21  Yes Ann Held, DO  levothyroxine (SYNTHROID) 175 MCG tablet Take 1 tablet (175 mcg total) by mouth daily before breakfast. 06/29/22  Yes Ann Held, DO  methylphenidate (RITALIN) 10 MG tablet Take 1 tablet (10 mg total) by mouth 3 (three) times daily with meals. 07/18/22  Yes Ann Held, DO  Polyethylene Glycol 3350 (MIRALAX PO) Take 17 g by mouth daily.   Yes [provider]  prochlorperazine (COMPAZINE) 5 MG tablet Take 1-2 tablets (5-10 mg total) by mouth every 6 (six) hours as needed for nausea or vomiting. 06/01/22  Yes Ladell Pier, MD  traZODone (DESYREL) 100 MG tablet TAKE 1 TABLET(100 MG) BY MOUTH AT BEDTIME 04/30/22  Yes Roma Schanz R, DO  albuterol (VENTOLIN HFA) 108 (90 Base) MCG/ACT inhaler Inhale 2 puffs into the lungs every 6 (six) hours as needed for wheezing or shortness of breath. 11/20/21   Terrilyn Saver, NP  phentermine (ADIPEX-P) 37.5 MG tablet Take 37.5 mg by mouth every morning. 06/21/22   [provider]  Current Outpatient Medications  Medication Sig Dispense Refill   capecitabine (XELODA) 500 MG tablet Take 3 tablets (1,500 mg total) by mouth 2 (two) times daily after a meal. Take x 14 days on, then 7 days off. Repeat cycle every 21 days. Start cycle on 11/06 84 tablet 0   diclofenac Sodium (VOLTAREN) 1 % GEL Apply small amount to palms and soles twice a day 100 g 2   Docusate Sodium (COLACE PO) Take 3 capsules by mouth daily.     famotidine (PEPCID) 40 MG tablet TAKE 1 TABLET(40 MG) BY MOUTH TWICE DAILY 180 tablet 1   fexofenadine (ALLEGRA) 180 MG tablet Take 180 mg by mouth daily.     Fluticasone-Umeclidin-Vilant  (TRELEGY ELLIPTA) 100-62.5-25 MCG/ACT AEPB 1 inh qd 1 each 11   levothyroxine (SYNTHROID) 175 MCG tablet Take 1 tablet (175 mcg total) by mouth daily before breakfast. 90 tablet 1   methylphenidate (RITALIN) 10 MG tablet Take 1 tablet (10 mg total) by mouth 3 (three) times daily with meals. 90 tablet 0   Polyethylene Glycol 3350 (MIRALAX PO) Take 17 g by mouth daily.     prochlorperazine (COMPAZINE) 5 MG tablet Take 1-2 tablets (5-10 mg total) by mouth every 6 (six) hours as needed for nausea or vomiting. 30 tablet 3   traZODone (DESYREL) 100 MG tablet TAKE 1 TABLET(100 MG) BY MOUTH AT BEDTIME 90 tablet 0   albuterol (VENTOLIN HFA) 108 (90 Base) MCG/ACT inhaler Inhale 2 puffs into the lungs every 6 (six) hours as needed for wheezing or shortness of breath. 18 g 5   phentermine (ADIPEX-P) 37.5 MG tablet Take 37.5 mg by mouth every morning.     Current Facility-Administered Medications  Medication Dose Route Frequency Provider Last Rate Last Admin   0.9 %  sodium chloride infusion  500 mL Intravenous Once Daryel November, MD        Allergies as of 07/31/2022 - Review Complete 07/31/2022  Allergen Reaction Noted   Penicillins Hives and Swelling 03/10/2007   Fish-derived products  10/16/2021   Penicillin g Other (See Comments) 10/16/2021   Strawberry extract  10/16/2021    Family History  Problem Relation Age of Onset   Diabetes Maternal Grandfather    Breast cancer Paternal Grandmother    Thyroid disease Paternal Grandmother    Colon cancer Neg Hx    Esophageal cancer Neg Hx    Rectal cancer Neg Hx    Stomach cancer Neg Hx     Social History   Socioeconomic History   Marital status: Married    Spouse name: Not on file   Number of children: 2   Years of education: Not on file   Highest education level: Not on file  Occupational History    Employer: [employer]   Occupation: Homemaker  Tobacco Use   Smoking status: Never   Smokeless tobacco: Never  Vaping Use    Vaping Use: Never used  Substance and Sexual Activity   Alcohol use: Yes    Comment: occasional    Drug use: No   Sexual activity: Not on file  Other Topics Concern   Not on file  Social History Narrative   Not on file   Social Determinants of Health   Financial Resource Strain: Not on file  Food Insecurity: Not on file  Transportation Needs: Not on file  Physical Activity: Not on file  Stress: Not on file  Social Connections: Not on file  Intimate Partner Violence: Not on  file    Review of Systems:  All other review of systems negative except as mentioned in the HPI.  Physical Exam: Vital signs BP (!) 156/99   Pulse 79   Temp 98.6 F (37 C)   Resp 10   Ht 5' 6.5" (1.689 m)   Wt 160 lb (72.6 kg)   LMP 07/31/2018   SpO2 100%   BMI 25.44 kg/m   General:   Alert,  Well-developed, well-nourished, pleasant and cooperative in NAD Airway:  Mallampati 3 Lungs:  Clear throughout to auscultation.   Heart:  Regular rate and rhythm; no murmurs, clicks, rubs,  or gallops. Abdomen:  Soft, nontender and nondistended. Normal bowel sounds.   Neuro/Psych:  Normal mood and affect. A and O x 3   Deanie Jupiter E. Candis Schatz, MD [facility]

## 2022-08-01 ENCOUNTER — Telehealth: Payer: Self-pay

## 2022-08-01 NOTE — Telephone Encounter (Signed)
  Follow up Call-     07/31/2022    7:12 AM 07/12/2022    1:50 PM  Call back number  Post procedure Call Back phone  # [phone]  Permission to leave phone message Yes Yes     Patient questions:  Do you have a fever, pain , or abdominal swelling? No. Pain Score  0 *  Have you tolerated food without any problems? Yes.    Have you been able to return to your normal activities? Yes.    Do you have any questions about your discharge instructions: Diet   No. Medications  No. Follow up visit  No.  Do you have questions or concerns about your Care? No.  Actions: * If pain score is 4 or above: No action needed, pain <4.

## 2022-08-02 ENCOUNTER — Inpatient Hospital Stay (HOSPITAL_BASED_OUTPATIENT_CLINIC_OR_DEPARTMENT_OTHER): Payer: Commercial Managed Care - PPO | Admitting: Nurse Practitioner

## 2022-08-02 ENCOUNTER — Inpatient Hospital Stay: Payer: Commercial Managed Care - PPO | Attending: Nurse Practitioner

## 2022-08-02 ENCOUNTER — Inpatient Hospital Stay: Payer: Commercial Managed Care - PPO

## 2022-08-02 ENCOUNTER — Encounter: Payer: Self-pay | Admitting: Nurse Practitioner

## 2022-08-02 VITALS — BP 135/83 | HR 81 | Temp 98.2°F | Resp 18 | Ht 66.0 in | Wt 164.6 lb

## 2022-08-02 DIAGNOSIS — C189 Malignant neoplasm of colon, unspecified: Secondary | ICD-10-CM | POA: Diagnosis not present

## 2022-08-02 DIAGNOSIS — Z23 Encounter for immunization: Secondary | ICD-10-CM | POA: Insufficient documentation

## 2022-08-02 DIAGNOSIS — J45909 Unspecified asthma, uncomplicated: Secondary | ICD-10-CM | POA: Diagnosis not present

## 2022-08-02 DIAGNOSIS — C180 Malignant neoplasm of cecum: Secondary | ICD-10-CM | POA: Insufficient documentation

## 2022-08-02 DIAGNOSIS — E039 Hypothyroidism, unspecified: Secondary | ICD-10-CM | POA: Diagnosis not present

## 2022-08-02 DIAGNOSIS — C182 Malignant neoplasm of ascending colon: Secondary | ICD-10-CM

## 2022-08-02 LAB — CBC WITH DIFFERENTIAL (CANCER CENTER ONLY)
Abs Immature Granulocytes: 0.01 10*3/uL (ref 0.00–0.07)
Basophils Absolute: 0 10*3/uL (ref 0.0–0.1)
Basophils Relative: 0 %
Eosinophils Absolute: 0.2 10*3/uL (ref 0.0–0.5)
Eosinophils Relative: 4 %
HCT: 35.2 % — ABNORMAL LOW (ref 36.0–46.0)
Hemoglobin: 11.8 g/dL — ABNORMAL LOW (ref 12.0–15.0)
Immature Granulocytes: 0 %
Lymphocytes Relative: 39 %
Lymphs Abs: 1.9 10*3/uL (ref 0.7–4.0)
MCH: 32.1 pg (ref 26.0–34.0)
MCHC: 33.5 g/dL (ref 30.0–36.0)
MCV: 95.7 fL (ref 80.0–100.0)
Monocytes Absolute: 0.5 10*3/uL (ref 0.1–1.0)
Monocytes Relative: 10 %
Neutro Abs: 2.2 10*3/uL (ref 1.7–7.7)
Neutrophils Relative %: 47 %
Platelet Count: 321 10*3/uL (ref 150–400)
RBC: 3.68 MIL/uL — ABNORMAL LOW (ref 3.87–5.11)
RDW: 21.6 % — ABNORMAL HIGH (ref 11.5–15.5)
WBC Count: 4.7 10*3/uL (ref 4.0–10.5)
nRBC: 0 % (ref 0.0–0.2)

## 2022-08-02 LAB — CMP (CANCER CENTER ONLY)
ALT: 20 U/L (ref 0–44)
AST: 18 U/L (ref 15–41)
Albumin: 4.4 g/dL (ref 3.5–5.0)
Alkaline Phosphatase: 34 U/L — ABNORMAL LOW (ref 38–126)
Anion gap: 8 (ref 5–15)
BUN: 12 mg/dL (ref 6–20)
CO2: 30 mmol/L (ref 22–32)
Calcium: 9.6 mg/dL (ref 8.9–10.3)
Chloride: 103 mmol/L (ref 98–111)
Creatinine: 0.94 mg/dL (ref 0.44–1.00)
GFR, Estimated: >60 mL/min (ref >60)
Glucose, Bld: 93 mg/dL (ref 70–99)
Potassium: 4.5 mmol/L (ref 3.5–5.1)
Sodium: 141 mmol/L (ref 135–145)
Total Bilirubin: 0.5 mg/dL (ref 0.3–1.2)
Total Protein: 7.1 g/dL (ref 6.5–8.1)

## 2022-08-02 MED ORDER — INFLUENZA VAC SPLIT QUAD 0.5 ML IM SUSY
0.5000 mL | PREFILLED_SYRINGE | Freq: Once | INTRAMUSCULAR | Status: AC
  Administered 2022-08-02: 0.5 mL via INTRAMUSCULAR
  Filled 2022-08-02: qty 0.5

## 2022-08-02 NOTE — Patient Instructions (Signed)

## 2022-08-02 NOTE — Progress Notes (Signed)
  [facility] OFFICE PROGRESS NOTE   Diagnosis:  Colon cancer  INTERVAL HISTORY:   Shelby Warren returns as scheduled.  She completed cycle 4 adjuvant Xeloda beginning 07/16/2022.  She has mild intermittent nausea.  She took a few doses of Compazine with good relief.  No mouth sores.  No diarrhea.  Hand-foot symptoms are better since beginning Voltaren gel twice a day.  She noted significant improvement in erythema and had no pain over the palms or soles.  She continues to note dryness and is applying lotion liberally.  Objective:  Vital signs in last 24 hours:  Blood pressure 135/83, pulse 81, temperature 98.2 F (36.8 C), temperature source Oral, resp. rate 18, height _0  (1.676 m), weight 164 lb 9.6 oz (74.7 kg), last menstrual period 07/31/2018, SpO2 100 %.    HEENT: No thrush or ulcers. Resp: Lungs clear bilaterally. Cardio: Regular rate and rhythm. GI: Abdomen soft and nontender.  No hepatosplenomegaly. Vascular: No leg edema. Skin: Palms with mild erythema, dryness.  Soles without erythema, mild dryness.   Lab Results:  Lab Results  Component Value Date   WBC 4.7 08/02/2022   HGB 11.8 (L) 08/02/2022   HCT 35.2 (L) 08/02/2022   MCV 95.7 08/02/2022   PLT 321 08/02/2022   NEUTROABS 2.2 08/02/2022    Imaging:  No results found.  Medications: I have reviewed the patient's current medications.  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, stage IIa (T3N0), status post a right colectomy 04/12/2022  No macroscopic tumor perforation, negative resection margins 0/31 nodes, no tumor deposits, no lymphovascular perineural invasion, no loss of mismatch repair protein expression CT abdomen/pelvis 04/11/2022-cecal mass with obstruction of the ostium of the appendix with evidence of acute appendicitis, lymphadenopathy in the ileocolic mesentery CT chest 04/13/2022-no evidence of metastatic  disease, pneumoperitoneum and trace bilateral pleural effusions felt to be secondary to surgery Elevated preoperative CEA; CEA in normal range 05/14/2022 (1.83) Cycle 1 Xeloda 05/14/2022 Cycle 2 Xeloda 06/04/2022 Cycle 3 Xeloda 06/25/2022 Cycle 4 Xeloda 07/16/2022 Colonoscopy 07/31/2022-melanosis in the colon; patent side-to-side ileocolonic anastomosis; no specimens collected Cycle 5 Xeloda 08/06/2022   Asthma ADD G2 P2 Hysterectomy Hypothyroid History of an anal fissure History of a colon polyp-sigmoid colon polyp removed 05/29/2017-mucosal prolapse type polyp, melanosis coli, no dysplasia or malignancy Hand-foot syndrome secondary to Xeloda-improved with Voltaren gel  Disposition: Shelby Warren appears stable.  She has completed 4 cycles of adjuvant Xeloda.  Other than hand-foot syndrome she is tolerating Xeloda well.  The hand-foot symptoms have improved since beginning Voltaren gel twice daily.  She will continue the same.  Plan to proceed with cycle 5 adjuvant Xeloda as scheduled beginning 08/06/2022.  CBC reviewed.  Counts adequate to proceed with Xeloda as above.  She will receive the influenza vaccine today.  She will return for lab and an office visit 08/22/2022.  We are available to see her sooner if needed.    Ned Card ANP/GNP-BC   08/02/2022  8:17 AM

## 2022-08-07 ENCOUNTER — Other Ambulatory Visit: Payer: Self-pay | Admitting: Family Medicine

## 2022-08-15 ENCOUNTER — Other Ambulatory Visit: Payer: Self-pay | Admitting: Oncology

## 2022-08-15 DIAGNOSIS — C189 Malignant neoplasm of colon, unspecified: Secondary | ICD-10-CM

## 2022-08-16 ENCOUNTER — Other Ambulatory Visit: Payer: Self-pay | Admitting: Oncology

## 2022-08-16 ENCOUNTER — Other Ambulatory Visit: Payer: Self-pay | Admitting: Family Medicine

## 2022-08-16 DIAGNOSIS — C189 Malignant neoplasm of colon, unspecified: Secondary | ICD-10-CM

## 2022-08-16 DIAGNOSIS — F988 Other specified behavioral and emotional disorders with onset usually occurring in childhood and adolescence: Secondary | ICD-10-CM

## 2022-08-16 MED ORDER — METHYLPHENIDATE HCL 10 MG PO TABS: 10.0000 mg | ORAL_TABLET | Freq: Three times a day (TID) | ORAL | 0 refills | Status: DC

## 2022-08-16 NOTE — Telephone Encounter (Signed)
Requesting: Ritalin '10mg'$   Contract: 10/24/21 UDS: 10/24/21 Last Visit: 05/25/22 Next Visit: None Last Refill: 07/18/22 #90 and 0RF   Please Advise

## 2022-08-22 ENCOUNTER — Inpatient Hospital Stay: Payer: Commercial Managed Care - PPO

## 2022-08-22 ENCOUNTER — Inpatient Hospital Stay (HOSPITAL_BASED_OUTPATIENT_CLINIC_OR_DEPARTMENT_OTHER): Payer: Commercial Managed Care - PPO | Admitting: Oncology

## 2022-08-22 VITALS — BP 133/80 | HR 80 | Temp 98.2°F | Resp 20 | Ht 66.0 in | Wt 170.6 lb

## 2022-08-22 DIAGNOSIS — C189 Malignant neoplasm of colon, unspecified: Secondary | ICD-10-CM | POA: Diagnosis not present

## 2022-08-22 DIAGNOSIS — C182 Malignant neoplasm of ascending colon: Secondary | ICD-10-CM

## 2022-08-22 DIAGNOSIS — C180 Malignant neoplasm of cecum: Secondary | ICD-10-CM | POA: Diagnosis not present

## 2022-08-22 LAB — CBC WITH DIFFERENTIAL (CANCER CENTER ONLY)
Abs Immature Granulocytes: 0.01 10*3/uL (ref 0.00–0.07)
Basophils Absolute: 0 10*3/uL (ref 0.0–0.1)
Basophils Relative: 0 %
Eosinophils Absolute: 0.2 10*3/uL (ref 0.0–0.5)
Eosinophils Relative: 4 %
HCT: 35.7 % — ABNORMAL LOW (ref 36.0–46.0)
Hemoglobin: 12.2 g/dL (ref 12.0–15.0)
Immature Granulocytes: 0 %
Lymphocytes Relative: 33 %
Lymphs Abs: 1.5 10*3/uL (ref 0.7–4.0)
MCH: 33.7 pg (ref 26.0–34.0)
MCHC: 34.2 g/dL (ref 30.0–36.0)
MCV: 98.6 fL (ref 80.0–100.0)
Monocytes Absolute: 0.4 10*3/uL (ref 0.1–1.0)
Monocytes Relative: 10 %
Neutro Abs: 2.4 10*3/uL (ref 1.7–7.7)
Neutrophils Relative %: 53 %
Platelet Count: 298 10*3/uL (ref 150–400)
RBC: 3.62 MIL/uL — ABNORMAL LOW (ref 3.87–5.11)
RDW: 19.3 % — ABNORMAL HIGH (ref 11.5–15.5)
WBC Count: 4.5 10*3/uL (ref 4.0–10.5)
nRBC: 0 % (ref 0.0–0.2)

## 2022-08-22 LAB — CMP (CANCER CENTER ONLY)
ALT: 17 U/L (ref 0–44)
AST: 17 U/L (ref 15–41)
Albumin: 4.5 g/dL (ref 3.5–5.0)
Alkaline Phosphatase: 30 U/L — ABNORMAL LOW (ref 38–126)
Anion gap: 7 (ref 5–15)
BUN: 17 mg/dL (ref 6–20)
CO2: 32 mmol/L (ref 22–32)
Calcium: 9.9 mg/dL (ref 8.9–10.3)
Chloride: 103 mmol/L (ref 98–111)
Creatinine: 0.91 mg/dL (ref 0.44–1.00)
GFR, Estimated: >60 mL/min (ref >60)
Glucose, Bld: 119 mg/dL — ABNORMAL HIGH (ref 70–99)
Potassium: 4.1 mmol/L (ref 3.5–5.1)
Sodium: 142 mmol/L (ref 135–145)
Total Bilirubin: 0.7 mg/dL (ref 0.3–1.2)
Total Protein: 7.3 g/dL (ref 6.5–8.1)

## 2022-08-22 MED ORDER — ONDANSETRON HCL 8 MG PO TABS: 8.0000 mg | ORAL_TABLET | Freq: Two times a day (BID) | ORAL | 2 refills | Status: DC

## 2022-08-22 NOTE — Progress Notes (Signed)
[place] OFFICE PROGRESS NOTE   Diagnosis: Colon Warren  INTERVAL HISTORY:   [facility] Warren returns as well.  She completed on cycle Xeloda beginning 08/06/2022.  She reports increased nausea with this cycle.  No emesis.  Compazine helps the nausea.  She reports constant nausea while on Xeloda.  No mouth sores or diarrhea. She has increased discomfort and skin breakdown at the feet greater than the hands.  She continues Aquaphor.  She is using Voltaren gel on the hands.  She is concerned the Voltaren gel has caused increased dryness.  Objective:  Vital signs in last 24 hours:  Blood pressure 133/80, pulse 80, temperature 98.2 F (36.8 C), temperature source Oral, resp. rate 20, height _0  (1.676 m), weight 170 lb 9.6 oz (77.4 kg), last menstrual period 07/31/2018, SpO2 100 %.    HEENT: No thrush or ulcers Resp: Lungs clear bilaterally Cardio: Regular rate and rhythm GI: Nontender, no hepatosplenomegaly Vascular: No leg edema  Skin: Mild erythema at the distal fingers, superficial desquamation over the fingers.  Feet without erythema.  Callus formation and superficial desquamation at the soles   Lab Results:  Lab Results  Component Value Date   WBC 4.5 08/22/2022   HGB 12.2 08/22/2022   HCT 35.7 (L) 08/22/2022   MCV 98.6 08/22/2022   PLT 298 08/22/2022   NEUTROABS 2.4 08/22/2022    CMP  Lab Results  Component Value Date   NA 142 08/22/2022   K 4.1 08/22/2022   CL 103 08/22/2022   CO2 32 08/22/2022   GLUCOSE 119 (H) 08/22/2022   BUN 17 08/22/2022   CREATININE 0.91 08/22/2022   CALCIUM 9.9 08/22/2022   PROT 7.3 08/22/2022   ALBUMIN 4.5 08/22/2022   AST 17 08/22/2022   ALT 17 08/22/2022   ALKPHOS 30 (L) 08/22/2022   BILITOT 0.7 08/22/2022   GFRNONAA >60 08/22/2022    Lab Results  Component Value Date   CEA1 22.0 (H) 04/12/2022   CEA 1.83 05/14/2022    Lab Results  Component Value Date   INR 1.0 04/11/2022   LABPROT 13.4 04/11/2022     Imaging:  No results found.  Medications: I have reviewed the patient's current medications.   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, stage IIa (T3N0), status post a right colectomy 04/12/2022  No macroscopic tumor perforation, negative resection margins 0/31 nodes, no tumor deposits, no lymphovascular perineural invasion, no loss of mismatch repair protein expression CT abdomen/pelvis 04/11/2022-cecal mass with obstruction of the ostium of the appendix with evidence of acute appendicitis, lymphadenopathy in the ileocolic mesentery CT chest 04/13/2022-no evidence of metastatic disease, pneumoperitoneum and trace bilateral pleural effusions felt to be secondary to surgery Elevated preoperative CEA; CEA in normal range 05/14/2022 (1.83) Cycle 1 Xeloda 05/14/2022 Cycle 2 Xeloda 06/04/2022 Cycle 3 Xeloda 06/25/2022 Cycle 4 Xeloda 07/16/2022 Colonoscopy 07/31/2022-melanosis in the colon; patent side-to-side ileocolonic anastomosis; no specimens collected Cycle 5 Xeloda 08/06/2022 Cycle 6 Xeloda 08/27/2022, dose reduction secondary to hand/foot syndrome   Asthma ADD G2 P2 Hysterectomy Hypothyroid History of an anal fissure History of a colon polyp-sigmoid colon polyp removed 05/29/2017-mucosal prolapse type polyp, melanosis coli, no dysplasia or malignancy Hand-foot syndrome secondary to Xeloda-persistent despite the addition of Voltaren gel Nausea secondary to Xeloda-Zofran added beginning with cycle 6    Disposition: [facility] Warren has a history of colon Warren.  She is completing adjuvant Xeloda.  She has completed 5 cycles to date.  She has hand/foot syndrome secondary to Xeloda.  Xeloda will be dose reduced with this cycle.  She will contact us on 08/27/2022 if the hand and foot symptoms have not improved prior to beginning the next cycle.  She will take Zofran prior to each dose of Xeloda.  She will continue  Compazine as needed.  [facility] will return for an office and lab visit in 3 weeks.  Betsy Coder, MD  08/22/2022  9:30 AM

## 2022-09-03 ENCOUNTER — Encounter: Payer: Self-pay | Admitting: Oncology

## 2022-09-03 ENCOUNTER — Other Ambulatory Visit: Payer: Self-pay | Admitting: *Deleted

## 2022-09-03 MED ORDER — ONDANSETRON HCL 8 MG PO TABS: 8.0000 mg | ORAL_TABLET | Freq: Two times a day (BID) | ORAL | 2 refills | Status: DC

## 2022-09-04 ENCOUNTER — Encounter: Payer: Self-pay | Admitting: Oncology

## 2022-09-06 ENCOUNTER — Other Ambulatory Visit: Payer: Self-pay | Admitting: *Deleted

## 2022-09-06 DIAGNOSIS — C189 Malignant neoplasm of colon, unspecified: Secondary | ICD-10-CM

## 2022-09-06 MED ORDER — CAPECITABINE 500 MG PO TABS: 1000.0000 mg | ORAL_TABLET | Freq: Two times a day (BID) | ORAL | 0 refills | Status: DC

## 2022-09-11 ENCOUNTER — Other Ambulatory Visit: Payer: Self-pay | Admitting: Family Medicine

## 2022-09-11 DIAGNOSIS — G4700 Insomnia, unspecified: Secondary | ICD-10-CM

## 2022-09-13 ENCOUNTER — Inpatient Hospital Stay (HOSPITAL_BASED_OUTPATIENT_CLINIC_OR_DEPARTMENT_OTHER): Payer: Commercial Managed Care - PPO | Admitting: Oncology

## 2022-09-13 ENCOUNTER — Encounter: Payer: Self-pay | Admitting: *Deleted

## 2022-09-13 ENCOUNTER — Other Ambulatory Visit: Payer: Self-pay | Admitting: Oncology

## 2022-09-13 ENCOUNTER — Inpatient Hospital Stay: Payer: Commercial Managed Care - PPO | Attending: Nurse Practitioner

## 2022-09-13 VITALS — BP 127/79 | HR 85 | Temp 98.1°F | Resp 18 | Ht 66.0 in | Wt 169.8 lb

## 2022-09-13 DIAGNOSIS — E039 Hypothyroidism, unspecified: Secondary | ICD-10-CM | POA: Diagnosis not present

## 2022-09-13 DIAGNOSIS — C180 Malignant neoplasm of cecum: Secondary | ICD-10-CM | POA: Diagnosis present

## 2022-09-13 DIAGNOSIS — C189 Malignant neoplasm of colon, unspecified: Secondary | ICD-10-CM

## 2022-09-13 DIAGNOSIS — J45909 Unspecified asthma, uncomplicated: Secondary | ICD-10-CM | POA: Diagnosis not present

## 2022-09-13 LAB — CMP (CANCER CENTER ONLY)
ALT: 14 U/L (ref 0–44)
AST: 17 U/L (ref 15–41)
Albumin: 4.5 g/dL (ref 3.5–5.0)
Alkaline Phosphatase: 30 U/L — ABNORMAL LOW (ref 38–126)
Anion gap: 9 (ref 5–15)
BUN: 13 mg/dL (ref 6–20)
CO2: 32 mmol/L (ref 22–32)
Calcium: 9.8 mg/dL (ref 8.9–10.3)
Chloride: 100 mmol/L (ref 98–111)
Creatinine: 0.84 mg/dL (ref 0.44–1.00)
GFR, Estimated: >60 mL/min (ref >60)
Glucose, Bld: 109 mg/dL — ABNORMAL HIGH (ref 70–99)
Potassium: 4.3 mmol/L (ref 3.5–5.1)
Sodium: 141 mmol/L (ref 135–145)
Total Bilirubin: 0.5 mg/dL (ref 0.3–1.2)
Total Protein: 7.1 g/dL (ref 6.5–8.1)

## 2022-09-13 LAB — CBC WITH DIFFERENTIAL (CANCER CENTER ONLY)
Abs Immature Granulocytes: 0.01 10*3/uL (ref 0.00–0.07)
Basophils Absolute: 0 10*3/uL (ref 0.0–0.1)
Basophils Relative: 0 %
Eosinophils Absolute: 0.2 10*3/uL (ref 0.0–0.5)
Eosinophils Relative: 5 %
HCT: 37.7 % (ref 36.0–46.0)
Hemoglobin: 12.8 g/dL (ref 12.0–15.0)
Immature Granulocytes: 0 %
Lymphocytes Relative: 33 %
Lymphs Abs: 1.4 10*3/uL (ref 0.7–4.0)
MCH: 34.8 pg — ABNORMAL HIGH (ref 26.0–34.0)
MCHC: 34 g/dL (ref 30.0–36.0)
MCV: 102.4 fL — ABNORMAL HIGH (ref 80.0–100.0)
Monocytes Absolute: 0.4 10*3/uL (ref 0.1–1.0)
Monocytes Relative: 10 %
Neutro Abs: 2.2 10*3/uL (ref 1.7–7.7)
Neutrophils Relative %: 52 %
Platelet Count: 292 10*3/uL (ref 150–400)
RBC: 3.68 MIL/uL — ABNORMAL LOW (ref 3.87–5.11)
RDW: 16.2 % — ABNORMAL HIGH (ref 11.5–15.5)
WBC Count: 4.3 10*3/uL (ref 4.0–10.5)
nRBC: 0 % (ref 0.0–0.2)

## 2022-09-13 NOTE — Progress Notes (Signed)
Pharmacy only dispensed #18 ondansetron due to insurance quantity limit. Started PA process on Covermymeds to increase quantity to 60 tabs/month. Called [facility] in urged them to ship her Xeloda today and was informed they will.

## 2022-09-13 NOTE — Progress Notes (Signed)
  [place] OFFICE PROGRESS NOTE   Diagnosis: Colon cancer  INTERVAL HISTORY:   [facility] Warren completed another cycle of Xeloda beginning 08/27/2022.  No mouth sores or diarrhea.  Hand/foot symptoms have improved.  She continues to use moisturizers frequently.  Nausea is better when she takes Zofran prior to Xeloda.  Objective:  Vital signs in last 24 hours:  Blood pressure 127/79, pulse 85, temperature 98.1 F (36.7 C), temperature source Oral, resp. rate 18, height _0  (1.676 m), weight 169 lb 12.8 oz (77 kg), last menstrual period 07/31/2018, SpO2 100 %.    HEENT: No thrush or ulcers Resp: Lungs clear bilaterally Cardio: Rate and rhythm GI: No hepatosplenomegaly, nontender Vascular: No leg edema  Skin: Dryness of the palms, dryness, callus formation, and superficial desquamation at the soles  Portacath/PICC-without erythema  Lab Results:  Lab Results  Component Value Date   WBC 4.3 09/13/2022   HGB 12.8 09/13/2022   HCT 37.7 09/13/2022   MCV 102.4 (H) 09/13/2022   PLT 292 09/13/2022   NEUTROABS 2.2 09/13/2022    CMP  Lab Results  Component Value Date   NA 142 08/22/2022   K 4.1 08/22/2022   CL 103 08/22/2022   CO2 32 08/22/2022   GLUCOSE 119 (H) 08/22/2022   BUN 17 08/22/2022   CREATININE 0.91 08/22/2022   CALCIUM 9.9 08/22/2022   PROT 7.3 08/22/2022   ALBUMIN 4.5 08/22/2022   AST 17 08/22/2022   ALT 17 08/22/2022   ALKPHOS 30 (L) 08/22/2022   BILITOT 0.7 08/22/2022   GFRNONAA >60 08/22/2022    Lab Results  Component Value Date   CEA1 22.0 (H) 04/12/2022   CEA 1.83 05/14/2022     Medications: I have reviewed the patient's current medications.   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, stage IIa (T3N0), status post a right colectomy 04/12/2022  No macroscopic tumor perforation, negative resection margins 0/31 nodes, no tumor deposits, no  lymphovascular perineural invasion, no loss of mismatch repair protein expression CT abdomen/pelvis 04/11/2022-cecal mass with obstruction of the ostium of the appendix with evidence of acute appendicitis, lymphadenopathy in the ileocolic mesentery CT chest 04/13/2022-no evidence of metastatic disease, pneumoperitoneum and trace bilateral pleural effusions felt to be secondary to surgery Elevated preoperative CEA; CEA in normal range 05/14/2022 (1.83) Cycle 1 Xeloda 05/14/2022 Cycle 2 Xeloda 06/04/2022 Cycle 3 Xeloda 06/25/2022 Cycle 4 Xeloda 07/16/2022 Colonoscopy 07/31/2022-melanosis in the colon; patent side-to-side ileocolonic anastomosis; no specimens collected Cycle 5 Xeloda 08/06/2022 Cycle 6 Xeloda 08/27/2022, dose reduction secondary to hand/foot syndrome Cycle 7 Xeloda 09/17/2022   Asthma ADD G2 P2 Hysterectomy Hypothyroid History of an anal fissure History of a colon polyp-sigmoid colon polyp removed 05/29/2017-mucosal prolapse type polyp, melanosis coli, no dysplasia or malignancy Hand-foot syndrome secondary to Xeloda-persistent despite the addition of Voltaren gel Nausea secondary to Xeloda-Zofran added beginning with cycle 6      Disposition: [facility] has completed 6 cycles of Xeloda.  She tolerated cycle 6 better with a dose reduction.  She will complete cycle 7 beginning 09/17/2022.  She will continue Zofran prophylaxis prior to each dose of Xeloda.  She will call for new symptoms.  [facility] will return for an office visit in 3 weeks.  Betsy Coder, MD  09/13/2022  8:21 AM

## 2022-09-17 ENCOUNTER — Encounter: Payer: Self-pay | Admitting: *Deleted

## 2022-09-18 ENCOUNTER — Encounter: Payer: Self-pay | Admitting: Family Medicine

## 2022-09-18 DIAGNOSIS — F988 Other specified behavioral and emotional disorders with onset usually occurring in childhood and adolescence: Secondary | ICD-10-CM

## 2022-09-18 MED ORDER — METHYLPHENIDATE HCL 10 MG PO TABS: 10.0000 mg | ORAL_TABLET | Freq: Three times a day (TID) | ORAL | 0 refills | Status: DC

## 2022-09-18 NOTE — Telephone Encounter (Signed)
Requesting: Ritalin '10mg'$   Contract:  10/24/21 UDS: 10/24/21 Last Visit: 05/25/22 Next Visit: None Last Refill: 08/16/22 #90 and 0RF   Please Advise

## 2022-09-27 ENCOUNTER — Encounter: Payer: Self-pay | Admitting: *Deleted

## 2022-09-27 ENCOUNTER — Other Ambulatory Visit: Payer: Self-pay | Admitting: Oncology

## 2022-09-27 DIAGNOSIS — C189 Malignant neoplasm of colon, unspecified: Secondary | ICD-10-CM

## 2022-10-04 ENCOUNTER — Inpatient Hospital Stay (HOSPITAL_BASED_OUTPATIENT_CLINIC_OR_DEPARTMENT_OTHER): Payer: Commercial Managed Care - PPO | Admitting: Oncology

## 2022-10-04 ENCOUNTER — Inpatient Hospital Stay: Payer: Commercial Managed Care - PPO | Attending: Nurse Practitioner

## 2022-10-04 VITALS — BP 145/80 | HR 71 | Temp 98.1°F | Resp 18 | Ht 66.0 in | Wt 171.8 lb

## 2022-10-04 DIAGNOSIS — J45909 Unspecified asthma, uncomplicated: Secondary | ICD-10-CM | POA: Insufficient documentation

## 2022-10-04 DIAGNOSIS — C189 Malignant neoplasm of colon, unspecified: Secondary | ICD-10-CM

## 2022-10-04 DIAGNOSIS — C188 Malignant neoplasm of overlapping sites of colon: Secondary | ICD-10-CM | POA: Insufficient documentation

## 2022-10-04 DIAGNOSIS — E039 Hypothyroidism, unspecified: Secondary | ICD-10-CM | POA: Insufficient documentation

## 2022-10-04 DIAGNOSIS — J90 Pleural effusion, not elsewhere classified: Secondary | ICD-10-CM | POA: Insufficient documentation

## 2022-10-04 LAB — CBC WITH DIFFERENTIAL (CANCER CENTER ONLY)
Abs Immature Granulocytes: 0.01 10*3/uL (ref 0.00–0.07)
Basophils Absolute: 0 10*3/uL (ref 0.0–0.1)
Basophils Relative: 0 %
Eosinophils Absolute: 0.2 10*3/uL (ref 0.0–0.5)
Eosinophils Relative: 5 %
HCT: 38.4 % (ref 36.0–46.0)
Hemoglobin: 13.2 g/dL (ref 12.0–15.0)
Immature Granulocytes: 0 %
Lymphocytes Relative: 35 %
Lymphs Abs: 1.6 10*3/uL (ref 0.7–4.0)
MCH: 34.5 pg — ABNORMAL HIGH (ref 26.0–34.0)
MCHC: 34.4 g/dL (ref 30.0–36.0)
MCV: 100.3 fL — ABNORMAL HIGH (ref 80.0–100.0)
Monocytes Absolute: 0.4 10*3/uL (ref 0.1–1.0)
Monocytes Relative: 9 %
Neutro Abs: 2.3 10*3/uL (ref 1.7–7.7)
Neutrophils Relative %: 51 %
Platelet Count: 316 10*3/uL (ref 150–400)
RBC: 3.83 MIL/uL — ABNORMAL LOW (ref 3.87–5.11)
RDW: 14.6 % (ref 11.5–15.5)
WBC Count: 4.5 10*3/uL (ref 4.0–10.5)
nRBC: 0 % (ref 0.0–0.2)

## 2022-10-04 LAB — CMP (CANCER CENTER ONLY)
ALT: 17 U/L (ref 0–44)
AST: 17 U/L (ref 15–41)
Albumin: 4.5 g/dL (ref 3.5–5.0)
Alkaline Phosphatase: 34 U/L — ABNORMAL LOW (ref 38–126)
Anion gap: 10 (ref 5–15)
BUN: 14 mg/dL (ref 6–20)
CO2: 29 mmol/L (ref 22–32)
Calcium: 10 mg/dL (ref 8.9–10.3)
Chloride: 101 mmol/L (ref 98–111)
Creatinine: 0.9 mg/dL (ref 0.44–1.00)
GFR, Estimated: >60 mL/min (ref >60)
Glucose, Bld: 121 mg/dL — ABNORMAL HIGH (ref 70–99)
Potassium: 4.3 mmol/L (ref 3.5–5.1)
Sodium: 140 mmol/L (ref 135–145)
Total Bilirubin: 0.5 mg/dL (ref 0.3–1.2)
Total Protein: 7.3 g/dL (ref 6.5–8.1)

## 2022-10-04 LAB — CEA (ACCESS): CEA (CHCC): 2.75 ng/mL (ref 0.00–5.00)

## 2022-10-04 NOTE — Progress Notes (Signed)
[place] OFFICE PROGRESS NOTE   Diagnosis: Colon cancer  INTERVAL HISTORY:   [facility] Warren completed another cycle of Xeloda beginning 09/17/2022.  No nausea, mouth sores, or diarrhea.  The hands and feet have improved.  She continues Voltaren gel and moisturizers. She reports intermittent pain at the right lower abdomen for several months.  The pain became worse after she lifted bins during the Christmas holiday.  No consistent pain.  Objective:  Vital signs in last 24 hours:  Blood pressure (!) 145/80, pulse 71, temperature 98.1 F (36.7 C), temperature source Oral, resp. rate 18, height _0  (1.676 m), weight 171 lb 12.8 oz (77.9 kg), last menstrual period 07/31/2018, SpO2 100 %.    HEENT: No thrush or ulcers Resp: Lungs clear bilaterally Cardio: Regular rate and rhythm GI: No hepatosplenomegaly, mild tenderness in the right lower abdomen, no discrete mass, the abdomen is soft.  Slight smooth fullness in the right compared to the left lower abdomen Vascular: No leg edema  Skin: Erythema with superficial desquamation of the palms, callus formation and dryness at the soles   Lab Results:  Lab Results  Component Value Date   WBC 4.3 09/13/2022   HGB 12.8 09/13/2022   HCT 37.7 09/13/2022   MCV 102.4 (H) 09/13/2022   PLT 292 09/13/2022   NEUTROABS 2.2 09/13/2022    CMP  Lab Results  Component Value Date   NA 141 09/13/2022   K 4.3 09/13/2022   CL 100 09/13/2022   CO2 32 09/13/2022   GLUCOSE 109 (H) 09/13/2022   BUN 13 09/13/2022   CREATININE 0.84 09/13/2022   CALCIUM 9.8 09/13/2022   PROT 7.1 09/13/2022   ALBUMIN 4.5 09/13/2022   AST 17 09/13/2022   ALT 14 09/13/2022   ALKPHOS 30 (L) 09/13/2022   BILITOT 0.5 09/13/2022   GFRNONAA >60 09/13/2022    Lab Results  Component Value Date   CEA1 22.0 (H) 04/12/2022   CEA 1.83 05/14/2022    Medications: I have reviewed the patient's current medications.   Assessment/Plan: Moderately  differentiated adenocarcinoma of the cecum with involvement of the ileocecal valve and appendix, stage IIa (T3N0), status post a right colectomy 04/12/2022  No macroscopic tumor perforation, negative resection margins 0/31 nodes, no tumor deposits, no lymphovascular perineural invasion, no loss of mismatch repair protein expression CT abdomen/pelvis 04/11/2022-cecal mass with obstruction of the ostium of the appendix with evidence of acute appendicitis, lymphadenopathy in the ileocolic mesentery CT chest 04/13/2022-no evidence of metastatic disease, pneumoperitoneum and trace bilateral pleural effusions felt to be secondary to surgery Elevated preoperative CEA; CEA in normal range 05/14/2022 (1.83) Cycle 1 Xeloda 05/14/2022 Cycle 2 Xeloda 06/04/2022 Cycle 3 Xeloda 06/25/2022 Cycle 4 Xeloda 07/16/2022 Colonoscopy 07/31/2022-melanosis in the colon; patent side-to-side ileocolonic anastomosis; no specimens collected Cycle 5 Xeloda 08/06/2022 Cycle 6 Xeloda 08/27/2022, dose reduction secondary to hand/foot syndrome Cycle 7 Xeloda 09/17/2022 Cycle 8 Xeloda 10/04/2022   Asthma ADD G2 P2 Hysterectomy Hypothyroid History of an anal fissure History of a colon polyp-sigmoid colon polyp removed 05/29/2017-mucosal prolapse type polyp, melanosis coli, no dysplasia or malignancy Hand-foot syndrome secondary to Xeloda-persistent despite the addition of Voltaren gel Nausea secondary to Xeloda-Zofran added beginning with cycle 6      Disposition: [facility] Warren has completed 7 cycles Xeloda.  The hand/foot symptoms have improved with a dose reduction of Xeloda.  She will continue Voltaren gel and moisturizers.  She will complete a final cycle of Xeloda beginning 10/08/2022.  The right lower  abdomen discomfort is most likely related to a benign musculoskeletal condition following abdominal surgery and straining with heavy lifting.  She will call for increased or persistent pain. She will return for an office visit in 6  weeks.  Betsy Coder, MD  10/04/2022  8:11 AM

## 2022-10-15 ENCOUNTER — Other Ambulatory Visit: Payer: Self-pay | Admitting: Family Medicine

## 2022-10-15 ENCOUNTER — Encounter: Payer: Self-pay | Admitting: *Deleted

## 2022-10-15 DIAGNOSIS — F988 Other specified behavioral and emotional disorders with onset usually occurring in childhood and adolescence: Secondary | ICD-10-CM

## 2022-10-15 MED ORDER — METHYLPHENIDATE HCL 10 MG PO TABS: 10.0000 mg | ORAL_TABLET | Freq: Three times a day (TID) | ORAL | 0 refills | Status: DC

## 2022-10-15 NOTE — Telephone Encounter (Signed)
Requesting: methylphenidate '10mg'$  Contract: 10/24/21 UDS: 10/24/21 Last Visit: 05/25/22 Next Visit: none Last Refill: 09/18/22  Please Advise

## 2022-10-16 ENCOUNTER — Ambulatory Visit: Payer: BLUE CROSS/BLUE SHIELD | Admitting: Dermatology

## 2022-10-23 ENCOUNTER — Encounter: Payer: Self-pay | Admitting: Oncology

## 2022-10-29 ENCOUNTER — Encounter: Payer: Self-pay | Admitting: Family Medicine

## 2022-10-30 ENCOUNTER — Other Ambulatory Visit: Payer: Self-pay | Admitting: Family Medicine

## 2022-10-30 DIAGNOSIS — E039 Hypothyroidism, unspecified: Secondary | ICD-10-CM

## 2022-11-01 ENCOUNTER — Other Ambulatory Visit: Payer: Self-pay | Admitting: Family Medicine

## 2022-11-01 DIAGNOSIS — G4700 Insomnia, unspecified: Secondary | ICD-10-CM

## 2022-11-12 ENCOUNTER — Inpatient Hospital Stay: Payer: Commercial Managed Care - PPO | Attending: Nurse Practitioner

## 2022-11-12 ENCOUNTER — Inpatient Hospital Stay (HOSPITAL_BASED_OUTPATIENT_CLINIC_OR_DEPARTMENT_OTHER): Payer: Commercial Managed Care - PPO | Admitting: Oncology

## 2022-11-12 ENCOUNTER — Encounter: Payer: Self-pay | Admitting: Oncology

## 2022-11-12 VITALS — BP 136/82 | HR 83 | Temp 98.1°F | Resp 18 | Ht 66.0 in | Wt 170.0 lb

## 2022-11-12 DIAGNOSIS — Z9049 Acquired absence of other specified parts of digestive tract: Secondary | ICD-10-CM | POA: Diagnosis not present

## 2022-11-12 DIAGNOSIS — C188 Malignant neoplasm of overlapping sites of colon: Secondary | ICD-10-CM | POA: Insufficient documentation

## 2022-11-12 DIAGNOSIS — Z79899 Other long term (current) drug therapy: Secondary | ICD-10-CM | POA: Diagnosis not present

## 2022-11-12 DIAGNOSIS — E039 Hypothyroidism, unspecified: Secondary | ICD-10-CM | POA: Diagnosis not present

## 2022-11-12 DIAGNOSIS — C189 Malignant neoplasm of colon, unspecified: Secondary | ICD-10-CM

## 2022-11-12 DIAGNOSIS — K3580 Unspecified acute appendicitis: Secondary | ICD-10-CM | POA: Diagnosis not present

## 2022-11-12 DIAGNOSIS — J45909 Unspecified asthma, uncomplicated: Secondary | ICD-10-CM | POA: Diagnosis not present

## 2022-11-12 DIAGNOSIS — Z8719 Personal history of other diseases of the digestive system: Secondary | ICD-10-CM | POA: Insufficient documentation

## 2022-11-12 DIAGNOSIS — J90 Pleural effusion, not elsewhere classified: Secondary | ICD-10-CM | POA: Insufficient documentation

## 2022-11-12 LAB — CBC WITH DIFFERENTIAL (CANCER CENTER ONLY)
Abs Immature Granulocytes: 0 10*3/uL (ref 0.00–0.07)
Basophils Absolute: 0 10*3/uL (ref 0.0–0.1)
Basophils Relative: 1 %
Eosinophils Absolute: 0.2 10*3/uL (ref 0.0–0.5)
Eosinophils Relative: 5 %
HCT: 38.5 % (ref 36.0–46.0)
Hemoglobin: 13 g/dL (ref 12.0–15.0)
Immature Granulocytes: 0 %
Lymphocytes Relative: 44 %
Lymphs Abs: 1.7 10*3/uL (ref 0.7–4.0)
MCH: 32.8 pg (ref 26.0–34.0)
MCHC: 33.8 g/dL (ref 30.0–36.0)
MCV: 97.2 fL (ref 80.0–100.0)
Monocytes Absolute: 0.4 10*3/uL (ref 0.1–1.0)
Monocytes Relative: 10 %
Neutro Abs: 1.5 10*3/uL — ABNORMAL LOW (ref 1.7–7.7)
Neutrophils Relative %: 40 %
Platelet Count: 249 10*3/uL (ref 150–400)
RBC: 3.96 MIL/uL (ref 3.87–5.11)
RDW: 12.7 % (ref 11.5–15.5)
WBC Count: 3.9 10*3/uL — ABNORMAL LOW (ref 4.0–10.5)
nRBC: 0 % (ref 0.0–0.2)

## 2022-11-12 LAB — CMP (CANCER CENTER ONLY)
ALT: 16 U/L (ref 0–44)
AST: 18 U/L (ref 15–41)
Albumin: 4.3 g/dL (ref 3.5–5.0)
Alkaline Phosphatase: 30 U/L — ABNORMAL LOW (ref 38–126)
Anion gap: 8 (ref 5–15)
BUN: 15 mg/dL (ref 6–20)
CO2: 29 mmol/L (ref 22–32)
Calcium: 9.5 mg/dL (ref 8.9–10.3)
Chloride: 103 mmol/L (ref 98–111)
Creatinine: 0.84 mg/dL (ref 0.44–1.00)
GFR, Estimated: >60 mL/min (ref >60)
Glucose, Bld: 114 mg/dL — ABNORMAL HIGH (ref 70–99)
Potassium: 3.9 mmol/L (ref 3.5–5.1)
Sodium: 140 mmol/L (ref 135–145)
Total Bilirubin: 0.7 mg/dL (ref 0.3–1.2)
Total Protein: 7 g/dL (ref 6.5–8.1)

## 2022-11-12 NOTE — Progress Notes (Signed)
  [facility]   Diagnosis: Colon cancer  INTERVAL HISTORY:   [facility] Warren completed another cycle of Xeloda beginning 10/04/2022.  No mouth sores, diarrhea, or hand/foot pain.  Good appetite.  She has mild malaise.  She has noted improvement in the hand and foot skin changes. Objective:  Vital signs in last 24 hours:  Blood pressure 136/82, pulse 83, temperature 98.1 F (36.7 C), resp. rate 18, height '5\' 6"'$  (1.676 m), weight 170 lb (77.1 kg), last menstrual period 07/31/2018, SpO2 99 %.    HEENT: No thrush or ulcers Nodes: No cervical, supraclavicular, axillary, or inguinal nodes Resp: Lungs clear bilaterally Cardio: Regular rate and rhythm GI: No hepatosplenomegaly, no mass, nontender Vascular: No leg edema Skin: Skin thickening and mild superficial desquamation of the hands, dryness and skin thickening of the soles    Lab Results:  Lab Results  Component Value Date   WBC 3.9 (L) 11/12/2022   HGB 13.0 11/12/2022   HCT 38.5 11/12/2022   MCV 97.2 11/12/2022   PLT 249 11/12/2022   NEUTROABS 1.5 (L) 11/12/2022    CMP  Lab Results  Component Value Date   NA 140 10/04/2022   K 4.3 10/04/2022   CL 101 10/04/2022   CO2 29 10/04/2022   GLUCOSE 121 (H) 10/04/2022   BUN 14 10/04/2022   CREATININE 0.90 10/04/2022   CALCIUM 10.0 10/04/2022   PROT 7.3 10/04/2022   ALBUMIN 4.5 10/04/2022   AST 17 10/04/2022   ALT 17 10/04/2022   ALKPHOS 34 (L) 10/04/2022   BILITOT 0.5 10/04/2022   GFRNONAA >60 10/04/2022    Lab Results  Component Value Date   CEA1 22.0 (H) 04/12/2022   CEA 2.75 10/04/2022     Medications: I have reviewed the patient's current medications.   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, stage IIa (T3N0), status post a right colectomy 04/12/2022  No macroscopic tumor perforation, negative resection margins 0/31 nodes, no tumor deposits, no  lymphovascular perineural invasion, no loss of mismatch repair protein expression CT abdomen/pelvis 04/11/2022-cecal mass with obstruction of the ostium of the appendix with evidence of acute appendicitis, lymphadenopathy in the ileocolic mesentery CT chest 04/13/2022-no evidence of metastatic disease, pneumoperitoneum and trace bilateral pleural effusions felt to be secondary to surgery Elevated preoperative CEA; CEA in normal range 05/14/2022 (1.83) Cycle 1 Xeloda 05/14/2022 Cycle 2 Xeloda 06/04/2022 Cycle 3 Xeloda 06/25/2022 Cycle 4 Xeloda 07/16/2022 Colonoscopy 07/31/2022-melanosis in the colon; patent side-to-side ileocolonic anastomosis; no specimens collected Cycle 5 Xeloda 08/06/2022 Cycle 6 Xeloda 08/27/2022, dose reduction secondary to hand/foot syndrome Cycle 7 Xeloda 09/17/2022 Cycle 8 Xeloda 10/04/2022   Asthma ADD G2 P2 Hysterectomy Hypothyroid History of an anal fissure History of a colon polyp-sigmoid colon polyp removed 05/29/2017-mucosal prolapse type polyp, melanosis coli, no dysplasia or malignancy Hand-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  She tolerated the treatment well aside from hand/foot symptoms.  The hand and foot changes appear improved today.  Shelby Warren will return for a CEA in 2 months.  She will be scheduled for valence imaging and an office visit in July.  Shelby Coder, MD  11/12/2022  8:05 AM

## 2022-11-19 ENCOUNTER — Other Ambulatory Visit: Payer: Self-pay | Admitting: Family Medicine

## 2022-11-19 DIAGNOSIS — F988 Other specified behavioral and emotional disorders with onset usually occurring in childhood and adolescence: Secondary | ICD-10-CM

## 2022-11-19 MED ORDER — METHYLPHENIDATE HCL 10 MG PO TABS: 10.0000 mg | ORAL_TABLET | Freq: Three times a day (TID) | ORAL | 0 refills | Status: DC

## 2022-11-19 NOTE — Telephone Encounter (Signed)
Requesting: methylphenidate 81m Contract: No  last was 11/14/21 UDS: 10/24/21 Last Visit: 12/04/2021 Next Visit: message sent for pt to schedule Last Refill: 10/15/22  Please Advise

## 2022-11-20 ENCOUNTER — Encounter: Payer: Self-pay | Admitting: Oncology

## 2022-12-03 ENCOUNTER — Encounter: Payer: Self-pay | Admitting: Family Medicine

## 2022-12-03 ENCOUNTER — Ambulatory Visit: Payer: Commercial Managed Care - PPO | Admitting: Family Medicine

## 2022-12-03 VITALS — BP 118/86 | HR 85 | Temp 97.7°F | Resp 18 | Ht 66.0 in | Wt 171.0 lb

## 2022-12-03 DIAGNOSIS — E039 Hypothyroidism, unspecified: Secondary | ICD-10-CM | POA: Diagnosis not present

## 2022-12-03 DIAGNOSIS — F988 Other specified behavioral and emotional disorders with onset usually occurring in childhood and adolescence: Secondary | ICD-10-CM

## 2022-12-03 DIAGNOSIS — R1013 Epigastric pain: Secondary | ICD-10-CM | POA: Diagnosis not present

## 2022-12-03 DIAGNOSIS — Z79899 Other long term (current) drug therapy: Secondary | ICD-10-CM

## 2022-12-03 DIAGNOSIS — G4700 Insomnia, unspecified: Secondary | ICD-10-CM

## 2022-12-03 MED ORDER — FAMOTIDINE 40 MG PO TABS: ORAL_TABLET | ORAL | 1 refills | Status: DC

## 2022-12-03 MED ORDER — METHYLPHENIDATE HCL 10 MG PO TABS: 10.0000 mg | ORAL_TABLET | Freq: Three times a day (TID) | ORAL | 0 refills | Status: DC

## 2022-12-03 MED ORDER — TRAZODONE HCL 100 MG PO TABS: 100.0000 mg | ORAL_TABLET | Freq: Every day | ORAL | 0 refills | Status: DC

## 2022-12-03 NOTE — Assessment & Plan Note (Signed)
Uds/ contract utd Refill meds

## 2022-12-03 NOTE — Patient Instructions (Signed)
Living With Attention Deficit Hyperactivity Disorder If you have been diagnosed with attention deficit hyperactivity disorder (ADHD), you may be relieved that you now know why you have felt or behaved a certain way. Still, you may feel overwhelmed about the treatment ahead. You may also wonder how to get the support you need and how to deal with the condition day-to-day. With treatment and support, you can live with ADHD and manage your symptoms. How to manage lifestyle changes Managing lifestyle changes can be challenging. Seeking support from your healthcare provider, therapist, family, and friends can be helpful. How to recognize changes in your condition The following signs may mean that your treatment is working well and your condition is improving: Consistently being on time for appointments. Being more organized at home and work. Other people noticing improvements in your behavior. Achieving goals that you set for yourself. Thinking more clearly. The following signs may mean that your treatment is not working very well: Feeling impatience or more confusion. Missing, forgetting, or being late for appointments. An increasing sense of disorganization and messiness. More difficulty in reaching goals that you set for yourself. Loved ones becoming angry or frustrated with you. Follow these instructions at home: Medicines Take over-the-counter and prescription medicines only as told by your health care provider. Check with your health care provider before taking any new medicines. General instructions Create structure and an organized atmosphere at home. For example: Make a list of tasks, then rank them from most important to least important. Work on one task at a time until your listed tasks are done. Make a daily schedule and follow it consistently every day. Use an appointment calendar, and check it 2-3 times a day to keep on track. Keep it with you when you leave the house. Create  spaces where you keep certain things, and always put things back in their places after you use them. Keep all follow-up visits. Your health care provider will need to monitor your condition and adjust your treatment over time. Where to find support Talking to others  Keep emotion out of important discussions and speak in a calm, logical way. Listen closely and patiently to your loved ones. Try to understand their point of view, and try to avoid getting defensive. Take responsibility for the consequences of your actions. Ask that others do not take your behaviors personally. Aim to solve problems as they come up, and express your feelings instead of bottling them up. Talk openly about what you need from your loved ones and how they can support you. Consider going to family therapy sessions or having your family meet with a specialist who deals with ADHD-related behavior problems. Finances Not all insurance plans cover mental health care, so it is important to check with your insurance carrier. If paying for co-pays or counseling services is a problem, search for a local or county mental health care center. Public mental health care services may be offered there at a low cost or no cost when you are not able to see a private health care provider. If you are taking medicine for ADHD, you may be able to get the generic form, which may be less expensive than brand-name medicine. Some makers of prescription medicines also offer help to patients who cannot afford the medicines that they need. Therapy and support groups Talking with a mental health care provider and participating in support groups can help to improve your quality of life, daily functioning, and overall symptoms. Questions to ask your health   care provider: What are the risks and benefits of taking medicines? Would I benefit from therapy? How often should I follow up with a health care provider? Where to find more information Learn more  about ADHD from: Children and Adults with Attention Deficit Hyperactivity Disorder: chadd.org National Institute of Mental Health: nimh.nih.gov Centers for Disease Control and Prevention: cdc.gov Contact a health care provider if: You have side effects from your medicines, such as: Repeated muscle twitches, coughing, or speech outbursts. Sleep problems. Loss of appetite. Dizziness. Unusually fast heartbeat. Stomach pains. Headaches. You have new or worsening behavior problems. You are struggling with anxiety, depression, or substance abuse. Get help right away if: You have a severe reaction to a medicine. These symptoms may be an emergency. Get help right away. Call 911. Do not wait to see if the symptoms will go away. Do not drive yourself to the hospital. Take one of these steps if you feel like you may hurt yourself or others, or have thoughts about taking your own life: Go to your nearest emergency room. Call 911. Call the National Suicide Prevention Lifeline at 1-800-273-8255 or 988. This is open 24 hours a day. Text the Crisis Text Line at 741741. Summary With treatment and support, you can live with ADHD and manage your symptoms. Consider taking part in family therapy or self-help groups with family members or friends. When you talk with friends and family about your ADHD, be patient and communicate openly. Keep all follow-up visits. Your health care provider will need to monitor your condition and adjust your treatment over time. This information is not intended to replace advice given to you by your health care provider. Make sure you discuss any questions you have with your health care provider. Document Revised: 01/05/2022 Document Reviewed: 01/05/2022 Elsevier Patient Education  2023 [employer]  

## 2022-12-03 NOTE — Progress Notes (Signed)
Established Patient Office Visit  Subjective   Patient ID: Shelby Warren, female    DOB: 1966/01/24  Age: 57 y.o. MRN: [id number]  Chief Complaint  Patient presents with   ADD   Follow-up    HPI Pt is here f/u add and get thyroid checked .    No complaints   Patient Active Problem List   Diagnosis Date Noted   Cancer of right colon ([place]) 04/20/2022   Colonic mass 04/11/2022   Acute appendicitis 04/11/2022   Hot flashes 10/24/2021   Iron deficiency anemia 10/24/2021   Morbid obesity ([place]) 04/14/2019   Acne vulgaris 01/04/2017   Idiopathic urticaria 09/02/2012   Anxiety 07/17/2012   B12 deficiency 07/17/2012   ACUTE PHARYNGITIS 05/07/2008   GERD 03/24/2008   Allergic asthma, mild intermittent, uncomplicated [id number]   Hypothyroidism 01/21/2007   Attention deficit disorder 01/21/2007   Seasonal and perennial allergic rhinitis 01/21/2007   EYE SURGERY, HX OF 01/21/2007   Past Medical History:  Diagnosis Date   ADD (attention deficit disorder)    Allergic rhinitis    Asthma    B12 deficiency    Cancer ([place])    Hypothyroidism    Past Surgical History:  Procedure Laterality Date   ANAL FISSURE REPAIR     APPENDECTOMY N/A 04/12/2022   Procedure: APPENDECTOMY;  Surgeon: Dwan Bolt, MD;  Location: [facility];  Service: General;  Laterality: N/A;   LAPAROSCOPIC RIGHT HEMI COLECTOMY N/A 04/12/2022   Procedure: LAPAROSCOPIC RIGHT HEMI COLECTOMY AND TERMINAL ILEUM;  Surgeon: Dwan Bolt, MD;  Location: [place];  Service: General;  Laterality: N/A;   lasic eye surgery     LIVER BIOPSY Right 04/12/2022   Procedure: LIVER BIOPSY;  Surgeon: Dwan Bolt, MD;  Location: [place];  Service: General;  Laterality: Right;   spincture muscle     TOTAL ABDOMINAL HYSTERECTOMY  07/04/2020   dr Helane Rima   Social History   Tobacco Use   Smoking status: Never   Smokeless tobacco: Never  Vaping Use   Vaping Use: Never used  Substance Use Topics   Alcohol use: Yes    Comment:  occasional    Drug use: No   Social History   Socioeconomic History   Marital status: Married    Spouse name: Not on file   Number of children: 2   Years of education: Not on file   Highest education level: Not on file  Occupational History    Employer: [employer]   Occupation: Homemaker  Tobacco Use   Smoking status: Never   Smokeless tobacco: Never  Vaping Use   Vaping Use: Never used  Substance and Sexual Activity   Alcohol use: Yes    Comment: occasional    Drug use: No   Sexual activity: Not on file  Other Topics Concern   Not on file  Social History Narrative   Not on file   Social Determinants of Health   Financial Resource Strain: Not on file  Food Insecurity: Not on file  Transportation Needs: Not on file  Physical Activity: Not on file  Stress: Not on file  Social Connections: Not on file  Intimate Partner Violence: Not on file   Family Status  Relation Name Status   MGF  (Not Specified)   PGM  (Not Specified)   Neg Hx  (Not Specified)   Family History  Problem Relation Age of Onset   Diabetes Maternal Grandfather    Breast cancer Paternal  Grandmother    Thyroid disease Paternal Grandmother    Colon cancer Neg Hx    Esophageal cancer Neg Hx    Rectal cancer Neg Hx    Stomach cancer Neg Hx    Allergies  Allergen Reactions   Penicillins Hives and Swelling   Fish-Derived Products    Penicillin G Other (See Comments)   Strawberry Extract       Review of Systems  Constitutional:  Negative for fever and malaise/fatigue.  HENT:  Negative for congestion.   Eyes:  Negative for blurred vision.  Respiratory:  Negative for shortness of breath.   Cardiovascular:  Negative for chest pain, palpitations and leg swelling.  Gastrointestinal:  Negative for abdominal pain, blood in stool and nausea.  Genitourinary:  Negative for dysuria and frequency.  Musculoskeletal:  Negative for falls.  Skin:  Negative for rash.  Neurological:  Negative for  dizziness, loss of consciousness and headaches.  Endo/Heme/Allergies:  Negative for environmental allergies.  Psychiatric/Behavioral:  Negative for depression. The patient is not nervous/anxious.       Objective:     BP 118/86 (BP Location: Left Arm, Patient Position: Sitting, Cuff Size: Normal)   Pulse 85   Temp 97.7 F (36.5 C) (Oral)   Resp 18   Ht '5\' 6"'$  (1.676 m)   Wt 171 lb (77.6 kg)   LMP 07/31/2018   SpO2 98%   BMI 27.60 kg/m  BP Readings from Last 3 Encounters:  12/03/22 118/86  11/12/22 136/82  10/04/22 (!) 145/80   Wt Readings from Last 3 Encounters:  12/03/22 171 lb (77.6 kg)  11/12/22 170 lb (77.1 kg)  10/04/22 171 lb 12.8 oz (77.9 kg)   SpO2 Readings from Last 3 Encounters:  12/03/22 98%  11/12/22 99%  10/04/22 100%      Physical Exam Vitals and nursing note reviewed.  Constitutional:      Appearance: She is well-developed.  HENT:     Head: Normocephalic and atraumatic.  Eyes:     Conjunctiva/sclera: Conjunctivae normal.  Neck:     Thyroid: No thyromegaly.     Vascular: No carotid bruit or JVD.  Cardiovascular:     Rate and Rhythm: Normal rate and regular rhythm.     Heart sounds: Normal heart sounds. No murmur heard. Pulmonary:     Effort: Pulmonary effort is normal. No respiratory distress.     Breath sounds: Normal breath sounds. No wheezing or rales.  Chest:     Chest wall: No tenderness.  Musculoskeletal:     Cervical back: Normal range of motion and neck supple.  Neurological:     General: No focal deficit present.     Mental Status: She is alert and oriented to person, place, and time.  Psychiatric:        Mood and Affect: Mood normal.        Behavior: Behavior normal.        Thought Content: Thought content normal.        Judgment: Judgment normal.     No results found for any visits on 12/03/22.  Last CBC Lab Results  Component Value Date   WBC 3.9 (L) 11/12/2022   HGB 13.0 11/12/2022   HCT 38.5 11/12/2022   MCV 97.2  11/12/2022   MCH 32.8 11/12/2022   RDW 12.7 11/12/2022   PLT 249 [id number]   Last metabolic panel Lab Results  Component Value Date   GLUCOSE 114 (H) 11/12/2022   NA 140 11/12/2022   K  3.9 11/12/2022   CL 103 11/12/2022   CO2 29 11/12/2022   BUN 15 11/12/2022   CREATININE 0.84 11/12/2022   GFRNONAA >60 11/12/2022   CALCIUM 9.5 11/12/2022   PROT 7.0 11/12/2022   ALBUMIN 4.3 11/12/2022   BILITOT 0.7 11/12/2022   ALKPHOS 30 (L) 11/12/2022   AST 18 11/12/2022   ALT 16 11/12/2022   ANIONGAP 8 11/12/2022   Last lipids Lab Results  Component Value Date   CHOL 200 07/04/2012   HDL 61.30 07/04/2012   LDLCALC 123 (H) 07/04/2012   TRIG 77.0 07/04/2012   CHOLHDL 3 07/04/2012   Last hemoglobin A1c Lab Results  Component Value Date   HGBA1C 4.8 04/12/2022   Last thyroid functions Lab Results  Component Value Date   TSH 1.01 12/04/2021   T4TOTAL 11.9 12/04/2021   Last vitamin D No results found for: "25OHVITD2", "25OHVITD3", "VD25OH" Last vitamin B12 and Folate Lab Results  Component Value Date   VITAMINB12 >1500 (H) 05/13/2015      The 10-year ASCVD risk score (Arnett DK, et al., 2019) is: 1.5%    Assessment & Plan:   Problem List Items Addressed This Visit       Unprioritized   Hypothyroidism   Relevant Orders   Thyroid Panel With TSH   Attention deficit disorder    Uds/ contract utd Refill meds       Relevant Medications   methylphenidate (RITALIN) 10 MG tablet   methylphenidate (RITALIN) 10 MG tablet (Start on 01/02/2023)   methylphenidate (RITALIN) 10 MG tablet (Start on 02/01/2023)   Other Relevant Orders   Drug Monitoring Panel [id number] , Urine   Other Visit Diagnoses     High risk medication use    -  Primary   Relevant Orders   Drug Monitoring Panel [phone] , Urine   Dyspepsia       Relevant Medications   famotidine (PEPCID) 40 MG tablet   Insomnia, unspecified type       Relevant Medications   traZODone (DESYREL) 100 MG tablet        Return in about 6 months (around 06/05/2023), or if symptoms worsen or fail to improve.    Ann Held, DO

## 2022-12-04 LAB — THYROID PANEL WITH TSH
Free Thyroxine Index: 3.4 (ref 1.4–3.8)
T3 Uptake: 32 % (ref 22–35)
T4, Total: 10.5 ug/dL (ref 5.1–11.9)
TSH: 0.6 mIU/L

## 2022-12-05 LAB — DRUG MONITORING PANEL 376104, URINE
Amphetamines: NEGATIVE ng/mL
Barbiturates: NEGATIVE ng/mL
Benzodiazepines: NEGATIVE ng/mL
Cocaine Metabolite: NEGATIVE ng/mL
Desmethyltramadol: NEGATIVE ng/mL
Opiates: NEGATIVE ng/mL
Oxycodone: NEGATIVE ng/mL
Tramadol: NEGATIVE ng/mL

## 2022-12-05 LAB — DM TEMPLATE

## 2022-12-20 ENCOUNTER — Other Ambulatory Visit: Payer: Self-pay | Admitting: Family Medicine

## 2022-12-20 DIAGNOSIS — F988 Other specified behavioral and emotional disorders with onset usually occurring in childhood and adolescence: Secondary | ICD-10-CM

## 2023-01-01 ENCOUNTER — Encounter: Payer: Self-pay | Admitting: Family Medicine

## 2023-01-02 MED ORDER — LEVOTHYROXINE SODIUM 175 MCG PO TABS: 175.0000 ug | ORAL_TABLET | Freq: Every day | ORAL | 1 refills | Status: DC

## 2023-01-11 ENCOUNTER — Inpatient Hospital Stay: Payer: Commercial Managed Care - PPO | Attending: Nurse Practitioner

## 2023-01-11 ENCOUNTER — Inpatient Hospital Stay (HOSPITAL_BASED_OUTPATIENT_CLINIC_OR_DEPARTMENT_OTHER): Payer: Commercial Managed Care - PPO | Admitting: Oncology

## 2023-01-11 ENCOUNTER — Telehealth: Payer: Self-pay

## 2023-01-11 ENCOUNTER — Encounter: Payer: Self-pay | Admitting: Oncology

## 2023-01-11 VITALS — BP 122/79 | HR 86 | Temp 98.1°F | Resp 18 | Ht 66.0 in | Wt 169.0 lb

## 2023-01-11 DIAGNOSIS — C188 Malignant neoplasm of overlapping sites of colon: Secondary | ICD-10-CM | POA: Diagnosis present

## 2023-01-11 DIAGNOSIS — R110 Nausea: Secondary | ICD-10-CM | POA: Insufficient documentation

## 2023-01-11 DIAGNOSIS — C189 Malignant neoplasm of colon, unspecified: Secondary | ICD-10-CM

## 2023-01-11 DIAGNOSIS — Z90710 Acquired absence of both cervix and uterus: Secondary | ICD-10-CM | POA: Insufficient documentation

## 2023-01-11 DIAGNOSIS — J45909 Unspecified asthma, uncomplicated: Secondary | ICD-10-CM | POA: Insufficient documentation

## 2023-01-11 DIAGNOSIS — Z8719 Personal history of other diseases of the digestive system: Secondary | ICD-10-CM | POA: Insufficient documentation

## 2023-01-11 DIAGNOSIS — E039 Hypothyroidism, unspecified: Secondary | ICD-10-CM | POA: Diagnosis not present

## 2023-01-11 DIAGNOSIS — L271 Localized skin eruption due to drugs and medicaments taken internally: Secondary | ICD-10-CM | POA: Diagnosis not present

## 2023-01-11 LAB — CEA (ACCESS): CEA (CHCC): 1.78 ng/mL (ref 0.00–5.00)

## 2023-01-11 NOTE — Progress Notes (Signed)
  [facility] OFFICE PROGRESS NOTE   Diagnosis: Colon cancer  INTERVAL HISTORY:   Shelby Warren returns as scheduled.  She generally feels well.  She continues to have dryness and skin breakdown at the hands and feet.  She reports these changes are improving.  She is losing several of her nails.  She now has constipation.  This is a chronic symptom.  No bleeding.  Objective:  Vital signs in last 24 hours:  Blood pressure 122/79, pulse 86, temperature 98.1 F (36.7 C), temperature source Oral, resp. rate 18, height 5\' 6"  (1.676 m), weight 169 lb (76.7 kg), last menstrual period 07/31/2018, SpO2 100 %.     Lymphatics: No cervical, supraclavicular, axillary, or inguinal nodes Resp: Lungs clear bilaterally Cardio: Regular rate and rhythm GI: No hepatosplenomegaly, no mass, nontender Vascular: No leg edema  Skin: Dryness with areas of superficial desquamation at the palms, callus formation at the soles, breakdown of several fingernails and toenails   Lab Results:  Lab Results  Component Value Date   WBC 3.9 (L) 11/12/2022   HGB 13.0 11/12/2022   HCT 38.5 11/12/2022   MCV 97.2 11/12/2022   PLT 249 11/12/2022   NEUTROABS 1.5 (L) 11/12/2022    CMP  Lab Results  Component Value Date   NA 140 11/12/2022   K 3.9 11/12/2022   CL 103 11/12/2022   CO2 29 11/12/2022   GLUCOSE 114 (H) 11/12/2022   BUN 15 11/12/2022   CREATININE 0.84 11/12/2022   CALCIUM 9.5 11/12/2022   PROT 7.0 11/12/2022   ALBUMIN 4.3 11/12/2022   AST 18 11/12/2022   ALT 16 11/12/2022   ALKPHOS 30 (L) 11/12/2022   BILITOT 0.7 11/12/2022   GFRNONAA >60 11/12/2022    Lab Results  Component Value Date   CEA1 22.0 (H) 04/12/2022   CEA 2.75 10/04/2022   Medications: I have reviewed the patient's current medications.   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, stage IIa (T3N0), status post a right colectomy  04/12/2022  No macroscopic tumor perforation, negative resection margins 0/31 nodes, no tumor deposits, no lymphovascular perineural invasion, no loss of mismatch repair protein expression CT abdomen/pelvis 04/11/2022-cecal mass with obstruction of the ostium of the appendix with evidence of acute appendicitis, lymphadenopathy in the ileocolic mesentery CT chest 04/13/2022-no evidence of metastatic disease, pneumoperitoneum and trace bilateral pleural effusions felt to be secondary to surgery Elevated preoperative CEA; CEA in normal range 05/14/2022 (1.83) Cycle 1 Xeloda 05/14/2022 Cycle 2 Xeloda 06/04/2022 Cycle 3 Xeloda 06/25/2022 Cycle 4 Xeloda 07/16/2022 Colonoscopy 07/31/2022-melanosis in the colon; patent side-to-side ileocolonic anastomosis; no specimens collected Cycle 5 Xeloda 08/06/2022 Cycle 6 Xeloda 08/27/2022, dose reduction secondary to hand/foot syndrome Cycle 7 Xeloda 09/17/2022 Cycle 8 Xeloda 10/04/2022   Asthma ADD G2 P2 Hysterectomy Hypothyroid History of an anal fissure History of a colon polyp-sigmoid colon polyp removed 05/29/2017-mucosal prolapse type polyp, melanosis coli, no dysplasia or malignancy Hand-foot syndrome secondary to Xeloda-persistent despite the addition of Voltaren gel Nausea secondary to Xeloda-Zofran added beginning with cycle 6     Disposition: Shelby Warren completed a course of adjuvant Xeloda in January.  The hand/foot changes appear to be improving.  She will call if these skin findings do not continue to improve.  She will return for an office visit and surveillance CTs in July.  Thornton Papas, MD  01/11/2023  8:08 AM

## 2023-01-11 NOTE — Telephone Encounter (Signed)
I have left a message indicating that a CEA level is within normal range.

## 2023-01-11 NOTE — Telephone Encounter (Signed)
-----   Message from Ladene Artist, MD sent at 01/11/2023 12:16 PM EDT ----- Please call patient, CEA is normal, follow-up as scheduled

## 2023-01-18 ENCOUNTER — Other Ambulatory Visit: Payer: Self-pay | Admitting: Family Medicine

## 2023-01-18 DIAGNOSIS — F988 Other specified behavioral and emotional disorders with onset usually occurring in childhood and adolescence: Secondary | ICD-10-CM

## 2023-01-18 NOTE — Telephone Encounter (Signed)
Mychart message sent to let pt know that [facility] has refills for this medication already.

## 2023-02-19 ENCOUNTER — Other Ambulatory Visit: Payer: Self-pay | Admitting: Family Medicine

## 2023-02-19 DIAGNOSIS — F988 Other specified behavioral and emotional disorders with onset usually occurring in childhood and adolescence: Secondary | ICD-10-CM

## 2023-02-25 ENCOUNTER — Encounter: Payer: Self-pay | Admitting: Oncology

## 2023-03-13 ENCOUNTER — Inpatient Hospital Stay: Payer: Commercial Managed Care - PPO | Attending: Nurse Practitioner | Admitting: Nurse Practitioner

## 2023-03-13 ENCOUNTER — Encounter: Payer: Self-pay | Admitting: Nurse Practitioner

## 2023-03-13 VITALS — BP 126/84 | HR 80 | Temp 98.2°F | Resp 18 | Ht 66.0 in | Wt 166.9 lb

## 2023-03-13 DIAGNOSIS — Z08 Encounter for follow-up examination after completed treatment for malignant neoplasm: Secondary | ICD-10-CM | POA: Diagnosis present

## 2023-03-13 DIAGNOSIS — C189 Malignant neoplasm of colon, unspecified: Secondary | ICD-10-CM

## 2023-03-13 DIAGNOSIS — Z85038 Personal history of other malignant neoplasm of large intestine: Secondary | ICD-10-CM | POA: Diagnosis present

## 2023-03-13 DIAGNOSIS — E039 Hypothyroidism, unspecified: Secondary | ICD-10-CM | POA: Insufficient documentation

## 2023-03-13 NOTE — Progress Notes (Signed)
Shelby Warren   Diagnosis: Colon cancer  INTERVAL HISTORY:   Shelby Warren returns prior to scheduled follow-up for evaluation of intermittent redness involving the hands and feet.  Hands and feet become red and swollen when she is in the heat and also with increased activity.  She notes tingling in the fingertips before other symptoms occur.  She continues to Warren significant fatigue with activity.    Objective:  Vital signs in last 24 hours:  Blood pressure 126/84, pulse 80, temperature 98.2 F (36.8 C), temperature source Oral, resp. rate 18, height 5\' 6"  (1.676 m), weight 166 lb 14.4 oz (75.7 kg), last menstrual period 07/31/2018, SpO2 100 %.    HEENT: No thrush or ulcers. Resp: Lungs clear bilaterally. Cardio: Regular rate and rhythm. GI: Abdomen soft and nontender.  No hepatosplenomegaly. Vascular: No leg edema. Neuro: Vibratory sense intact over the fingertips per tuning fork exam. Skin: Palms mildly erythematous.  No skin breakdown.  Soles are dry appearing.   Lab Results:  Lab Results  Component Value Date   WBC 3.9 (L) 11/12/2022   HGB 13.0 11/12/2022   HCT 38.5 11/12/2022   MCV 97.2 11/12/2022   PLT 249 11/12/2022   NEUTROABS 1.5 (L) 11/12/2022    Imaging:  No results found.  Medications: I have reviewed the patient's current medications.  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, stage IIa (T3N0), status post a right colectomy 04/12/2022  No macroscopic tumor perforation, negative resection margins 0/31 nodes, no tumor deposits, no lymphovascular perineural invasion, no loss of mismatch repair protein expression CT abdomen/pelvis 04/11/2022-cecal mass with obstruction of the ostium of the appendix with evidence of acute appendicitis, lymphadenopathy in the ileocolic mesentery CT chest 04/13/2022-no evidence of metastatic disease, pneumoperitoneum  and trace bilateral pleural effusions felt to be secondary to surgery Elevated preoperative CEA; CEA in normal range 05/14/2022 (1.83) Cycle 1 Xeloda 05/14/2022 Cycle 2 Xeloda 06/04/2022 Cycle 3 Xeloda 06/25/2022 Cycle 4 Xeloda 07/16/2022 Colonoscopy 07/31/2022-melanosis in the colon; patent side-to-side ileocolonic anastomosis; no specimens collected Cycle 5 Xeloda 08/06/2022 Cycle 6 Xeloda 08/27/2022, dose reduction secondary to hand/foot syndrome Cycle 7 Xeloda 09/17/2022 Cycle 8 Xeloda 10/04/2022   Asthma ADD G2 P2 Hysterectomy Hypothyroid History of an anal fissure History of a colon polyp-sigmoid colon polyp removed 05/29/2017-mucosal prolapse type polyp, melanosis coli, no dysplasia or malignancy Hand-foot syndrome secondary to Xeloda-persistent despite the addition of Voltaren gel Nausea secondary to Xeloda-Zofran added beginning with cycle 6  Disposition: Shelby Warren has a history of stage II colon cancer.  She completed a course of adjuvant Xeloda January 2024.  She presents today with intermittent hand/foot pain, swelling, redness. During the course of treatment she had hand-foot syndrome with the same symptoms.  The symptoms seem to be slowly improving.  She will continue supportive measures with lotion and Voltaren gel.  We discussed the possibility of peripheral neuropathy but this seems less likely.  She will return for follow-up as scheduled next month.  Patient seen with Dr. Truett Perna.   Lonna Cobb ANP/GNP-BC   03/13/2023  9:11 AM This was a shared visit with Lonna Cobb.  Shelby Warren was interviewed and examined.  Mr completed adjuvant Xeloda in January of this year.  She has persistent hand/foot symptoms, though the skin changes and discomfort have significantly improved compared to when she was on treatment.  She continues to have erythema and swelling of the digits when she is warm  or physically active.  I suspect her symptoms are related to slowly resolving changes of  hand/foot syndrome.  I have a low suspicion for neuropathy.  We recommended she continue supportive care measures with moisturizers and Voltaren gel.  I was present for greater than 50% of today's visit.  I performed medical decision making.  Mancel Bale, MD

## 2023-03-22 ENCOUNTER — Other Ambulatory Visit: Payer: Self-pay | Admitting: Family Medicine

## 2023-03-22 DIAGNOSIS — F988 Other specified behavioral and emotional disorders with onset usually occurring in childhood and adolescence: Secondary | ICD-10-CM

## 2023-03-22 MED ORDER — METHYLPHENIDATE HCL 10 MG PO TABS: 10.0000 mg | ORAL_TABLET | Freq: Three times a day (TID) | ORAL | 0 refills | Status: DC

## 2023-03-22 NOTE — Telephone Encounter (Signed)
Requesting: Ritalin 10mg   Contract: 12/03/22 UDS: 12/03/22 Last Visit: 12/03/22 Next Visit: None Last Refill: 12/03/22 #90 and 0RF (x2)  Please Advise

## 2023-04-08 ENCOUNTER — Other Ambulatory Visit (HOSPITAL_BASED_OUTPATIENT_CLINIC_OR_DEPARTMENT_OTHER): Payer: Commercial Managed Care - PPO

## 2023-04-08 ENCOUNTER — Inpatient Hospital Stay: Payer: Commercial Managed Care - PPO

## 2023-04-10 ENCOUNTER — Ambulatory Visit: Payer: Commercial Managed Care - PPO | Admitting: Oncology

## 2023-04-15 ENCOUNTER — Inpatient Hospital Stay: Payer: Commercial Managed Care - PPO | Attending: Nurse Practitioner

## 2023-04-15 ENCOUNTER — Ambulatory Visit (HOSPITAL_BASED_OUTPATIENT_CLINIC_OR_DEPARTMENT_OTHER)
Admission: RE | Admit: 2023-04-15 | Discharge: 2023-04-15 | Disposition: A | Discharge status: Home / Self Care | Payer: Commercial Managed Care - PPO | Source: Ambulatory Visit | Attending: Oncology | Admitting: Oncology

## 2023-04-15 ENCOUNTER — Encounter (HOSPITAL_BASED_OUTPATIENT_CLINIC_OR_DEPARTMENT_OTHER): Payer: Self-pay

## 2023-04-15 DIAGNOSIS — C189 Malignant neoplasm of colon, unspecified: Secondary | ICD-10-CM | POA: Insufficient documentation

## 2023-04-15 DIAGNOSIS — C180 Malignant neoplasm of cecum: Secondary | ICD-10-CM | POA: Insufficient documentation

## 2023-04-15 LAB — BASIC METABOLIC PANEL - CANCER CENTER ONLY
Anion gap: 7 (ref 5–15)
BUN: 10 mg/dL (ref 6–20)
CO2: 28 mmol/L (ref 22–32)
Calcium: 9.5 mg/dL (ref 8.9–10.3)
Chloride: 106 mmol/L (ref 98–111)
Creatinine: 0.85 mg/dL (ref 0.44–1.00)
GFR, Estimated: >60 mL/min (ref >60)
Glucose, Bld: 133 mg/dL — ABNORMAL HIGH (ref 70–99)
Potassium: 4 mmol/L (ref 3.5–5.1)
Sodium: 141 mmol/L (ref 135–145)

## 2023-04-15 LAB — CEA (ACCESS): CEA (CHCC): 1.61 ng/mL (ref 0.00–5.00)

## 2023-04-15 MED ORDER — IOHEXOL 300 MG/ML  SOLN
100.0000 mL | Freq: Once | INTRAMUSCULAR | Status: AC | PRN
  Administered 2023-04-15: 75 mL via INTRAVENOUS

## 2023-04-17 ENCOUNTER — Inpatient Hospital Stay: Payer: Commercial Managed Care - PPO | Admitting: Oncology

## 2023-04-17 VITALS — BP 120/83 | HR 83 | Temp 98.2°F | Resp 18 | Ht 66.0 in | Wt 167.4 lb

## 2023-04-17 DIAGNOSIS — C189 Malignant neoplasm of colon, unspecified: Secondary | ICD-10-CM | POA: Diagnosis not present

## 2023-04-17 DIAGNOSIS — C180 Malignant neoplasm of cecum: Secondary | ICD-10-CM | POA: Diagnosis present

## 2023-04-17 NOTE — Progress Notes (Signed)
[facility] OFFICE PROGRESS NOTE   Diagnosis: Colon cancer  INTERVAL HISTORY:   Shelby Warren returns as scheduled.  She feels well.  Her bowel habits have returned to baseline constipation.  She is using parallax and a stool softener.  No bleeding.  The hand and foot skin changes much improved.  She continues moisturizers and intermittent Voltaren gel.  Objective:  Vital signs in last 24 hours:  Blood pressure 120/83, pulse 83, temperature 98.2 F (36.8 C), temperature source Oral, resp. rate 18, height 5\' 6"  (1.676 m), weight 167 lb 6.4 oz (75.9 kg), last menstrual period 07/31/2018, SpO2 100%.    Lymphatics: No cervical, supraclavicular, axillary, or inguinal nodes Resp: Lungs clear bilaterally Cardio: Regular rate and rhythm GI: Nontender, no mass, no hepatosplenomegaly Vascular: No leg edema  Skin: Dryness and callus formation at the soles without erythema, mild dryness of the hands without erythema or skin breakdown   Lab Results:  Lab Results  Component Value Date   WBC 3.9 (L) 11/12/2022   HGB 13.0 11/12/2022   HCT 38.5 11/12/2022   MCV 97.2 11/12/2022   PLT 249 11/12/2022   NEUTROABS 1.5 (L) 11/12/2022    CMP  Lab Results  Component Value Date   NA 141 04/15/2023   K 4.0 04/15/2023   CL 106 04/15/2023   CO2 28 04/15/2023   GLUCOSE 133 (H) 04/15/2023   BUN 10 04/15/2023   CREATININE 0.85 04/15/2023   CALCIUM 9.5 04/15/2023   PROT 7.0 11/12/2022   ALBUMIN 4.3 11/12/2022   AST 18 11/12/2022   ALT 16 11/12/2022   ALKPHOS 30 (L) 11/12/2022   BILITOT 0.7 11/12/2022   GFRNONAA >60 04/15/2023    Lab Results  Component Value Date   CEA1 22.0 (H) 04/12/2022   CEA 1.61 04/15/2023    Imaging:  CT CHEST ABDOMEN PELVIS W CONTRAST  Result Date: 04/15/2023 CLINICAL DATA:  History of colon cancer, surveillance. * Tracking Code: BO * EXAM: CT CHEST, ABDOMEN, AND PELVIS WITH CONTRAST TECHNIQUE: Multidetector CT imaging of the chest, abdomen and  pelvis was performed following the standard protocol during bolus administration of intravenous contrast. RADIATION DOSE REDUCTION: This exam was performed according to the departmental dose-optimization program which includes automated exposure control, adjustment of the mA and/or kV according to patient size and/or use of iterative reconstruction technique. CONTRAST:  75mL OMNIPAQUE IOHEXOL 300 MG/ML  SOLN COMPARISON:  CT April 11, 2022 and April 13, 2022. FINDINGS: CT CHEST FINDINGS Cardiovascular: Normal caliber thoracic aorta. No central pulmonary embolus on this nondedicated study. Normal size heart. No significant pericardial effusion/thickening. Mediastinum/Nodes: No suspicious thyroid nodule. No pathologically enlarged mediastinal, hilar or axillary lymph nodes. The esophagus is grossly unremarkable. Lungs/Pleura: No suspicious pulmonary nodules or masses. Biapical pleuroparenchymal scarring. No pleural effusion. No pneumothorax. Musculoskeletal: No aggressive lytic or blastic lesion of bone. CT ABDOMEN PELVIS FINDINGS Hepatobiliary: No suspicious hepatic lesion. Gallbladder is unremarkable. No biliary ductal dilation. Pancreas: No pancreatic ductal dilation or evidence of acute inflammation. Spleen: No splenomegaly. Adrenals/Urinary Tract: Bilateral adrenal glands appear normal. No hydronephrosis. Kidneys demonstrate symmetric enhancement. Urinary bladder is unremarkable for degree of distension. Stomach/Bowel: Radiopaque enteric contrast material traverses the cecum. Stomach is unremarkable for degree of distension. No pathologic dilation of small or large bowel. Moderate volume of formed stool in the colon. Sigmoid colonic diverticulosis without findings of acute diverticulitis. Surgical changes of partial right hemicolectomy. No suspicious nodularity along the suture line in the right hemipelvis. Vascular/Lymphatic: Normal caliber abdominal aorta.  Smooth IVC contours. The portal, splenic and superior  mesenteric veins are patent. No pathologically enlarged abdominal or pelvic lymph nodes. Reproductive: Status post hysterectomy. No adnexal masses. Other: No significant abdominopelvic free fluid. Musculoskeletal: No aggressive lytic or blastic lesion of bone. IMPRESSION: 1. Surgical changes of partial right hemicolectomy without evidence of local recurrence or metastatic disease in the chest, abdomen or pelvis. 2. Sigmoid colonic diverticulosis without findings of acute diverticulitis. Electronically Signed   By: Maudry Mayhew M.D.   On: 04/15/2023 15:21    Medications: I have reviewed the patient's current medications.   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, stage IIa (T3N0), status post a right colectomy 04/12/2022  No macroscopic tumor perforation, negative resection margins 0/31 nodes, no tumor deposits, no lymphovascular perineural invasion, no loss of mismatch repair protein expression CT abdomen/pelvis 04/11/2022-cecal mass with obstruction of the ostium of the appendix with evidence of acute appendicitis, lymphadenopathy in the ileocolic mesentery CT chest 04/13/2022-no evidence of metastatic disease, pneumoperitoneum and trace bilateral pleural effusions felt to be secondary to surgery Elevated preoperative CEA; CEA in normal range 05/14/2022 (1.83) Cycle 1 Xeloda 05/14/2022 Cycle 2 Xeloda 06/04/2022 Cycle 3 Xeloda 06/25/2022 Cycle 4 Xeloda 07/16/2022 Colonoscopy 07/31/2022-melanosis in the colon; patent side-to-side ileocolonic anastomosis; no specimens collected Cycle 5 Xeloda 08/06/2022 Cycle 6 Xeloda 08/27/2022, dose reduction secondary to hand/foot syndrome Cycle 7 Xeloda 09/17/2022 Cycle 8 Xeloda 10/04/2022 CT 04/15/2023-no evidence of recurrent disease   Asthma ADD G2 P2 Hysterectomy Hypothyroid History of an anal fissure History of a colon polyp-sigmoid colon polyp removed 05/29/2017-mucosal  prolapse type polyp, melanosis coli, no dysplasia or malignancy Hand-foot syndrome secondary to Xeloda-persistent despite the addition of Voltaren gel Nausea secondary to Xeloda-Zofran added beginning with cycle 6    Disposition: Ms. Glanz is in clinical remission from colon cancer.  Hand/foot symptoms and skin changes are much improved.  She will be scheduled for a surveillance colonoscopy with Dr. Tomasa Rand in October.  She will return for an office visit and CEA in 6 months.  Thornton Papas, MD  04/17/2023  8:59 AM

## 2023-04-18 ENCOUNTER — Other Ambulatory Visit: Payer: Self-pay | Admitting: Family Medicine

## 2023-04-18 DIAGNOSIS — F988 Other specified behavioral and emotional disorders with onset usually occurring in childhood and adolescence: Secondary | ICD-10-CM

## 2023-04-18 MED ORDER — METHYLPHENIDATE HCL 10 MG PO TABS
10.0000 mg | ORAL_TABLET | Freq: Three times a day (TID) | ORAL | 0 refills | Status: DC
Start: 2023-04-18 — End: 2023-05-20

## 2023-04-18 NOTE — Telephone Encounter (Signed)
Requesting: methylphenidate 10mg   Contract: 12/03/22 UDS: 12/03/22 Last Visit:  12/03/22 Next Visit: None Last Refill: 03/22/23 #90 and 0RF  Please Advise

## 2023-05-07 ENCOUNTER — Other Ambulatory Visit: Payer: Self-pay | Admitting: Family Medicine

## 2023-05-07 ENCOUNTER — Encounter: Payer: Self-pay | Admitting: Family Medicine

## 2023-05-07 DIAGNOSIS — D229 Melanocytic nevi, unspecified: Secondary | ICD-10-CM

## 2023-05-20 ENCOUNTER — Other Ambulatory Visit: Payer: Self-pay | Admitting: Family Medicine

## 2023-05-20 DIAGNOSIS — F988 Other specified behavioral and emotional disorders with onset usually occurring in childhood and adolescence: Secondary | ICD-10-CM

## 2023-05-20 DIAGNOSIS — G4700 Insomnia, unspecified: Secondary | ICD-10-CM

## 2023-05-20 MED ORDER — METHYLPHENIDATE HCL 10 MG PO TABS
10.0000 mg | ORAL_TABLET | Freq: Three times a day (TID) | ORAL | 0 refills | Status: DC
Start: 2023-05-20 — End: 2023-06-23

## 2023-05-20 NOTE — Telephone Encounter (Signed)
Requesting: Ritalin 10mg   Contract: 12/03/22 UDS: 12/03/22 Last Visit: 12/03/22 Next Visit: None Last Refill: 04/18/23 #90 and 0RF   Please Advise

## 2023-05-27 ENCOUNTER — Encounter: Payer: Self-pay | Admitting: Gastroenterology

## 2023-06-02 ENCOUNTER — Other Ambulatory Visit: Payer: Self-pay | Admitting: Family Medicine

## 2023-06-02 DIAGNOSIS — R1013 Epigastric pain: Secondary | ICD-10-CM

## 2023-06-04 ENCOUNTER — Encounter: Payer: Self-pay | Admitting: Family Medicine

## 2023-06-05 ENCOUNTER — Encounter: Payer: Self-pay | Admitting: Physician Assistant

## 2023-06-05 ENCOUNTER — Other Ambulatory Visit: Payer: Self-pay | Admitting: Physician Assistant

## 2023-06-05 ENCOUNTER — Telehealth (INDEPENDENT_AMBULATORY_CARE_PROVIDER_SITE_OTHER): Payer: Commercial Managed Care - PPO | Admitting: Physician Assistant

## 2023-06-05 ENCOUNTER — Telehealth: Payer: Self-pay

## 2023-06-05 DIAGNOSIS — U071 COVID-19: Secondary | ICD-10-CM

## 2023-06-05 DIAGNOSIS — J4540 Moderate persistent asthma, uncomplicated: Secondary | ICD-10-CM | POA: Diagnosis not present

## 2023-06-05 MED ORDER — NIRMATRELVIR/RITONAVIR (PAXLOVID)TABLET
3.0000 | ORAL_TABLET | Freq: Two times a day (BID) | ORAL | 0 refills | Status: DC
Start: 2023-06-05 — End: 2023-06-05

## 2023-06-05 MED ORDER — ALBUTEROL SULFATE HFA 108 (90 BASE) MCG/ACT IN AERS
2.0000 | INHALATION_SPRAY | Freq: Four times a day (QID) | RESPIRATORY_TRACT | 2 refills | Status: DC | PRN
Start: 2023-06-05 — End: 2023-08-26

## 2023-06-05 MED ORDER — HYDROCOD POLI-CHLORPHE POLI ER 10-8 MG/5ML PO SUER
5.0000 mL | Freq: Two times a day (BID) | ORAL | 0 refills | Status: AC | PRN
Start: 2023-06-05 — End: ?

## 2023-06-05 MED ORDER — NIRMATRELVIR/RITONAVIR (PAXLOVID)TABLET
3.0000 | ORAL_TABLET | Freq: Two times a day (BID) | ORAL | 0 refills | Status: AC
Start: 2023-06-05 — End: 2023-06-10

## 2023-06-05 NOTE — Telephone Encounter (Signed)
Full dose resent with note to pharmacy

## 2023-06-05 NOTE — Progress Notes (Signed)
MyChart Video Visit    Virtual Visit via Video Note   This format is felt to be most appropriate for this patient at this time. Physical exam was limited by quality of the video and audio technology used for the visit.   Patient location: home Provider location: [facility]  I discussed the limitations of evaluation and management by telemedicine and the availability of in person appointments. The patient expressed understanding and agreed to proceed.  Patient: Shelby Warren   DOB: [birth year]   57 y.o. Female  MRN: [id number] Visit Date: 06/05/2023  Today's healthcare provider: Alfredia Ferguson, PA-C   Cc. Covid, wheezing  Subjective    HPI  Pt is a 57 y/o female with a history of right colon cancer and chemotherapy last year.  Pt reports feeling sick starting Monday x 3 days ago with a sore throat, progressed to nasal congestion, headache, fever t max 103F, cough, wheezing. H/o asthma, typically does not need inhaler, used twice yesterday and did not feel it helped, but it is old. Denies SOB.  Using tylenol, which does bring down the fever.  Medications: Outpatient Medications Prior to Visit  Medication Sig   Docusate Sodium (COLACE PO) Take 4 capsules by mouth daily.   famotidine (PEPCID) 40 MG tablet TAKE 1 TABLET BY MOUTH TWICE DAILY   fexofenadine (ALLEGRA) 180 MG tablet Take 180 mg by mouth daily.   levothyroxine (SYNTHROID) 175 MCG tablet Take 1 tablet (175 mcg total) by mouth daily before breakfast.   methylphenidate (RITALIN) 10 MG tablet Take 1 tablet (10 mg total) by mouth 3 (three) times daily with meals.   ondansetron (ZOFRAN) 8 MG tablet Take 1 tablet (8 mg total) by mouth 2 (two) times daily. Take ~ 1 hour prior to each dose of chemotherapy to control nausea (Patient not taking: Reported on 01/11/2023)   phentermine (ADIPEX-P) 37.5 MG tablet Take 37.5 mg by mouth every morning.   Polyethylene Glycol 3350 (MIRALAX PO) Take 17 g by mouth daily.   prochlorperazine  (COMPAZINE) 5 MG tablet Take 1-2 tablets (5-10 mg total) by mouth every 6 (six) hours as needed for nausea or vomiting. (Patient not taking: Reported on 10/04/2022)   senna-docusate (SENOKOT-S) 8.6-50 MG tablet Take 2 tablets by mouth daily as needed for mild constipation.   traZODone (DESYREL) 100 MG tablet TAKE 1 TABLET BY MOUTH AT BEDTIME   [DISCONTINUED] albuterol (VENTOLIN HFA) 108 (90 Base) MCG/ACT inhaler Inhale 2 puffs into the lungs every 6 (six) hours as needed for wheezing or shortness of breath.   No facility-administered medications prior to visit.    Review of Systems  Constitutional:  Positive for fatigue and fever.  HENT:  Positive for congestion, rhinorrhea, sinus pressure and sinus pain.   Respiratory:  Positive for cough and wheezing. Negative for shortness of breath.   Cardiovascular:  Negative for chest pain and leg swelling.  Gastrointestinal:  Negative for abdominal pain.  Neurological:  Positive for headaches. Negative for dizziness.     Objective    LMP 07/31/2018   Physical Exam Constitutional:      Appearance: Normal appearance.  HENT:     Nose: Congestion present.  Neurological:     Mental Status: She is oriented to person, place, and time.  Psychiatric:        Mood and Affect: Mood normal.        Behavior: Behavior normal.        Assessment & Plan     1.  COVID-19 Rx paxlovid advised not to take trazodone  Rx tussionex  Hydrate, rest, tylenol - nirmatrelvir/ritonavir (PAXLOVID) 20 x 150 MG & 10 x 100MG  TABS; Take 3 tablets by mouth 2 (two) times daily for 5 days. (Take nirmatrelvir 150 mg two tablets twice daily for 5 days and ritonavir 100 mg one tablet twice daily for 5 days)  Dispense: 30 tablet; Refill: 0 - albuterol (VENTOLIN HFA) 108 (90 Base) MCG/ACT inhaler; Inhale 2 puffs into the lungs every 6 (six) hours as needed for wheezing or shortness of breath.  Dispense: 8 g; Refill: 2 - chlorpheniramine-HYDROcodone (TUSSIONEX) 10-8 MG/5ML; Take  5 mLs by mouth every 12 (twelve) hours as needed for cough.  Dispense: 115 mL; Refill: 0  2. Moderate persistent asthma without complication Refilled inhaler If wheezing worsens despite inhaler use advised pt to reach out and will send in prednisone course - albuterol (VENTOLIN HFA) 108 (90 Base) MCG/ACT inhaler; Inhale 2 puffs into the lungs every 6 (six) hours as needed for wheezing or shortness of breath.  Dispense: 8 g; Refill: 2   Return if symptoms worsen or fail to improve.     I discussed the assessment and treatment plan with the patient. The patient was provided an opportunity to ask questions and all were answered. The patient agreed with the plan and demonstrated an understanding of the instructions.   The patient was advised to call back or seek an in-person evaluation if the symptoms worsen or if the condition fails to improve as anticipated.  I provided 10 minutes of non-face-to-face time during this encounter.  I, Alfredia Ferguson, PA-C have reviewed all documentation for this visit. The documentation on  06/05/23   for the exam, diagnosis, procedures, and orders are all accurate and complete.   Alfredia Ferguson, PA-C [facility] at [facility] [phone] (phone) [phone] (fax)  [facility]

## 2023-06-11 ENCOUNTER — Other Ambulatory Visit: Payer: Self-pay | Admitting: Family Medicine

## 2023-06-17 ENCOUNTER — Other Ambulatory Visit: Payer: Self-pay | Admitting: Physician Assistant

## 2023-06-17 DIAGNOSIS — J4541 Moderate persistent asthma with (acute) exacerbation: Secondary | ICD-10-CM

## 2023-06-17 MED ORDER — DOXYCYCLINE HYCLATE 100 MG PO TABS
100.0000 mg | ORAL_TABLET | Freq: Two times a day (BID) | ORAL | 0 refills | Status: AC
Start: 2023-06-17 — End: 2023-06-24

## 2023-06-17 MED ORDER — PREDNISONE 20 MG PO TABS: 20.0000 mg | ORAL_TABLET | Freq: Every day | ORAL | 0 refills | Status: DC

## 2023-06-23 ENCOUNTER — Other Ambulatory Visit: Payer: Self-pay | Admitting: Family Medicine

## 2023-06-23 DIAGNOSIS — F988 Other specified behavioral and emotional disorders with onset usually occurring in childhood and adolescence: Secondary | ICD-10-CM

## 2023-06-24 MED ORDER — METHYLPHENIDATE HCL 10 MG PO TABS
10.0000 mg | ORAL_TABLET | Freq: Three times a day (TID) | ORAL | 0 refills | Status: DC
Start: 2023-06-24 — End: 2023-07-23

## 2023-06-24 NOTE — Telephone Encounter (Signed)
Requesting: methylphenidate 10mg   Contract: 12/03/22 UDS: 12/03/22 Last Visit: 12/03/22 Next Visit: None Last Refill: 05/20/23 #90 and 0RF   Please Advise

## 2023-07-04 ENCOUNTER — Ambulatory Visit (AMBULATORY_SURGERY_CENTER): Payer: Commercial Managed Care - PPO

## 2023-07-04 VITALS — Ht 66.0 in | Wt 160.0 lb

## 2023-07-04 DIAGNOSIS — C180 Malignant neoplasm of cecum: Secondary | ICD-10-CM

## 2023-07-04 DIAGNOSIS — Z85038 Personal history of other malignant neoplasm of large intestine: Secondary | ICD-10-CM

## 2023-07-04 MED ORDER — NA SULFATE-K SULFATE-MG SULF 17.5-3.13-1.6 GM/177ML PO SOLN
1.0000 | Freq: Once | ORAL | 0 refills | Status: AC
Start: 2023-07-04 — End: 2023-07-04

## 2023-07-04 NOTE — Progress Notes (Signed)

## 2023-07-22 ENCOUNTER — Encounter: Payer: Self-pay | Admitting: Gastroenterology

## 2023-07-23 ENCOUNTER — Other Ambulatory Visit: Payer: Self-pay | Admitting: Family Medicine

## 2023-07-23 ENCOUNTER — Encounter: Payer: Self-pay | Admitting: Family Medicine

## 2023-07-23 DIAGNOSIS — F988 Other specified behavioral and emotional disorders with onset usually occurring in childhood and adolescence: Secondary | ICD-10-CM

## 2023-07-23 MED ORDER — METHYLPHENIDATE HCL 10 MG PO TABS
10.0000 mg | ORAL_TABLET | Freq: Three times a day (TID) | ORAL | 0 refills | Status: DC
Start: 2023-07-23 — End: 2023-08-27

## 2023-07-23 NOTE — Telephone Encounter (Signed)
Requesting: methylphenidate 10mg   Contract: 12/03/22 UDS: 12/03/22 Last Visit: 12/03/22 Next Visit: None Last Refill: 06/24/23 #90 and 0RF   Please Advise

## 2023-07-25 ENCOUNTER — Other Ambulatory Visit (HOSPITAL_COMMUNITY): Payer: Self-pay

## 2023-07-25 ENCOUNTER — Telehealth: Payer: Self-pay

## 2023-07-25 NOTE — Telephone Encounter (Signed)
PA has been submitted and documented in separate encounter, please see telephone encounter from 07/25/2023 for follow up, thanks.

## 2023-07-25 NOTE — Telephone Encounter (Signed)
Pharmacy Patient Advocate Encounter   Received notification from Patient Advice Request messages that prior authorization for Methylphenidate 10mg  is required/requested.   Insurance verification completed.   The patient is insured through [facility] ADVANTAGE/RX ADVANCE .   Per test claim: PA required; PA started via CoverMyMeds. KEY [id number] . Waiting for clinical questions to populate.  Pharmacy Patient Advocate Encounter  Received notification from  [facility]  that Prior Authorization for Methylphenidate has been CANCELLED due to     Ran test claim that still showed PA required, through [facility]  Pharmacy Patient Advocate Encounter   Received notification from Patient Advice Request messages that prior authorization for Methylphenidate is required/requested.   Insurance verification completed.   The patient is insured through [facility] ADVANTAGE/RX ADVANCE .   Per test claim: PA required; PA submitted to [facility] ADVANTAGE/RX ADVANCE via Prompt PA Key/confirmation #/EOC [id number] Status is pending

## 2023-07-29 ENCOUNTER — Other Ambulatory Visit (HOSPITAL_COMMUNITY): Payer: Self-pay

## 2023-07-30 NOTE — Telephone Encounter (Signed)
PA submitted via fax to plan 10/29. Insurance keeps cancelling request.

## 2023-07-30 NOTE — Telephone Encounter (Signed)
Pharmacy Patient Advocate Encounter   Received notification from Fax that prior authorization for Methylphenidate 10mg  is required/requested.   Insurance verification completed.   The patient is insured through [facility] .   Per test claim: PA required; PA submitted to [facility] via Fax Key/confirmation #/EOC [id number] Status is pending

## 2023-08-01 ENCOUNTER — Encounter: Payer: Self-pay | Admitting: Gastroenterology

## 2023-08-01 ENCOUNTER — Ambulatory Visit (AMBULATORY_SURGERY_CENTER): Payer: Commercial Managed Care - PPO | Admitting: Gastroenterology

## 2023-08-01 VITALS — BP 155/93 | HR 70 | Temp 98.2°F | Resp 13 | Ht 66.0 in | Wt 160.0 lb

## 2023-08-01 DIAGNOSIS — Z85038 Personal history of other malignant neoplasm of large intestine: Secondary | ICD-10-CM

## 2023-08-01 DIAGNOSIS — Z08 Encounter for follow-up examination after completed treatment for malignant neoplasm: Secondary | ICD-10-CM | POA: Diagnosis present

## 2023-08-01 MED ORDER — SODIUM CHLORIDE 0.9 % IV SOLN: 500.0000 mL | Freq: Once | INTRAVENOUS | Status: DC

## 2023-08-01 MED ORDER — SODIUM CHLORIDE 0.9 % IV SOLN: 500.0000 mL | INTRAVENOUS | Status: DC

## 2023-08-01 NOTE — Progress Notes (Signed)
[facility] History and Physical   Primary Care Physician:  Zola Button, Grayling Congress, DO   Reason for Procedure:   History of colon cancer  Plan:    Surveillance colonoscopy     HPI: LAZARIA KUDRICK is a 57 y.o. female undergoing surveillance colonoscopy following a recent diagnosis of colon cancer after she presented with appendicitis (April 11, 2022). She underwent a R hemicolectomy and is being treated with Xeloda. She had a small sigmoid polyp in 2018.  She underwent a colonoscopy after her surgery in Oct 2023 to exclude synchronous lesions which was unremarkable.   Past Medical History:  Diagnosis Date   ADD (attention deficit disorder)    Allergic rhinitis    Asthma    B12 deficiency    Cancer (HCC)    Hypothyroidism     Past Surgical History:  Procedure Laterality Date   ANAL FISSURE REPAIR     APPENDECTOMY N/A 04/12/2022   Procedure: APPENDECTOMY;  Surgeon: Fritzi Mandes, MD;  Location: [facility] OR;  Service: General;  Laterality: N/A;   LAPAROSCOPIC RIGHT HEMI COLECTOMY N/A 04/12/2022   Procedure: LAPAROSCOPIC RIGHT HEMI COLECTOMY AND TERMINAL ILEUM;  Surgeon: Fritzi Mandes, MD;  Location: [facility] OR;  Service: General;  Laterality: N/A;   laproscopic knee Right 2018   lasic eye surgery     LIVER BIOPSY Right 04/12/2022   Procedure: LIVER BIOPSY;  Surgeon: Fritzi Mandes, MD;  Location: [facility] OR;  Service: General;  Laterality: Right;   spincture muscle     TOTAL ABDOMINAL HYSTERECTOMY  07/04/2020   dr Vincente Poli    Prior to Admission medications   Medication Sig Start Date End Date Taking? Authorizing Provider  albuterol (VENTOLIN HFA) 108 (90 Base) MCG/ACT inhaler Inhale 2 puffs into the lungs every 6 (six) hours as needed for wheezing or shortness of breath. 06/05/23   Alfredia Ferguson, PA-C  b complex vitamins capsule Take 1 capsule by mouth daily.    [provider]  Cholecalciferol (VITAMIN D3) 20 MCG (800 UNIT) TABS Take by mouth.    [provider]  Docusate Sodium (COLACE PO) Take 4 capsules by mouth daily.    [provider]  famotidine (PEPCID) 40 MG tablet TAKE 1 TABLET BY MOUTH TWICE DAILY 06/04/23   Zola Button, Grayling Congress, DO  fexofenadine (ALLEGRA) 180 MG tablet Take 180 mg by mouth daily.    [provider]  Iron Combinations (IRON COMPLEX PO) Take by mouth.    [provider]  levothyroxine (SYNTHROID) 175 MCG tablet TAKE 1 TABLET(175 MCG) BY MOUTH DAILY BEFORE BREAKFAST 06/11/23   Zola Button, Grayling Congress, DO  methylphenidate (RITALIN) 10 MG tablet Take 1 tablet (10 mg total) by mouth 3 (three) times daily with meals. 07/23/23   Seabron Spates R, DO  ondansetron (ZOFRAN) 8 MG tablet Take 1 tablet (8 mg total) by mouth 2 (two) times daily. Take ~ 1 hour prior to each dose of chemotherapy to control nausea 09/03/22   Ladene Artist, MD  phentermine (ADIPEX-P) 37.5 MG tablet Take 37.5 mg by mouth every morning. 06/21/22   [provider]  Polyethylene Glycol 3350 (MIRALAX PO) Take 17 g by mouth daily.    [provider]  traZODone (DESYREL) 100 MG tablet TAKE 1 TABLET BY MOUTH AT BEDTIME 05/20/23   Zola Button, Myrene Buddy R, DO  Zinc 30 MG TABS Take by mouth.    [provider]    Current Outpatient Medications  Medication  Sig Dispense Refill   albuterol (VENTOLIN HFA) 108 (90 Base) MCG/ACT inhaler Inhale 2 puffs into the lungs every 6 (six) hours as needed for wheezing or shortness of breath. 8 g 2   b complex vitamins capsule Take 1 capsule by mouth daily.     Cholecalciferol (VITAMIN D3) 20 MCG (800 UNIT) TABS Take by mouth.     Docusate Sodium (COLACE PO) Take 4 capsules by mouth daily.     famotidine (PEPCID) 40 MG tablet TAKE 1 TABLET BY MOUTH TWICE DAILY 180 tablet 1   fexofenadine (ALLEGRA) 180 MG tablet Take 180 mg by mouth daily.     Iron Combinations (IRON COMPLEX PO) Take by mouth.     levothyroxine (SYNTHROID) 175 MCG tablet TAKE 1 TABLET(175 MCG) BY MOUTH DAILY BEFORE  BREAKFAST 90 tablet 0   methylphenidate (RITALIN) 10 MG tablet Take 1 tablet (10 mg total) by mouth 3 (three) times daily with meals. 90 tablet 0   ondansetron (ZOFRAN) 8 MG tablet Take 1 tablet (8 mg total) by mouth 2 (two) times daily. Take ~ 1 hour prior to each dose of chemotherapy to control nausea 60 tablet 2   phentermine (ADIPEX-P) 37.5 MG tablet Take 37.5 mg by mouth every morning.     Polyethylene Glycol 3350 (MIRALAX PO) Take 17 g by mouth daily.     traZODone (DESYREL) 100 MG tablet TAKE 1 TABLET BY MOUTH AT BEDTIME 90 tablet 1   Zinc 30 MG TABS Take by mouth.     No current facility-administered medications for this visit.    Allergies as of 08/01/2023 - Review Complete 08/01/2023  Allergen Reaction Noted   Penicillins Hives and Swelling 03/10/2007   Fish-derived products  10/16/2021   Penicillin g Other (See Comments) 10/16/2021   Strawberry extract  10/16/2021    Family History  Problem Relation Age of Onset   Diabetes Maternal Grandfather    Breast cancer Paternal Grandmother    Thyroid disease Paternal Grandmother    Colon cancer Neg Hx    Esophageal cancer Neg Hx    Rectal cancer Neg Hx    Stomach cancer Neg Hx    Colon polyps Neg Hx     Social History   Socioeconomic History   Marital status: Married    Spouse name: Not on file   Number of children: 2   Years of education: Not on file   Highest education level: Not on file  Occupational History    Employer: [employer]   Occupation: Homemaker  Tobacco Use   Smoking status: Never   Smokeless tobacco: Never  Vaping Use   Vaping status: Never Used  Substance and Sexual Activity   Alcohol use: Yes    Comment: occasional    Drug use: No   Sexual activity: Not on file  Other Topics Concern   Not on file  Social History Narrative   Not on file   Social Determinants of Health   Financial Resource Strain: Not on file  Food Insecurity: Not on file  Transportation Needs: Not on file  Physical  Activity: Not on file  Stress: Not on file  Social Connections: Not on file  Intimate Partner Violence: Not on file    Review of Systems:  All other review of systems negative except as mentioned in the HPI.  Physical Exam: Vital signs LMP 07/31/2018   General:   Alert,  Well-developed, well-nourished, pleasant and cooperative in NAD Airway:  Mallampati 2 Lungs:  Clear  throughout to auscultation.   Heart:  Regular rate and rhythm; no murmurs, clicks, rubs,  or gallops. Abdomen:  Soft, nontender and nondistended. Normal bowel sounds.   Neuro/Psych:  Normal mood and affect. A and O x 3   Jedadiah Abdallah E. Tomasa Rand, MD [facility]

## 2023-08-01 NOTE — Op Note (Signed)
[facility] Patient Name: Shelby Warren Procedure Date: 08/01/2023 8:26 AM MRN: [id number] Endoscopist: Lorin Picket E. Tomasa Rand , MD, [id number] Age: 57 Referring MD:  Date of Birth: 03-26-1966 Gender: Female Account #: [id number] Procedure:                Colonoscopy Indications:              High risk colon cancer surveillance: Personal                            history of colon cancer (diagnosed July 2023), s/p                            R hemicolectomy + Xeloda (completed Jan 2024) Medicines:                Monitored Anesthesia Care Procedure:                Pre-Anesthesia Assessment:                           - Prior to the procedure, a History and Physical                            was performed, and patient medications and                            allergies were reviewed. The patient's tolerance of                            previous anesthesia was also reviewed. The risks                            and benefits of the procedure and the sedation                            options and risks were discussed with the patient.                            All questions were answered, and informed consent                            was obtained. Prior Anticoagulants: The patient has                            taken no anticoagulant or antiplatelet agents. ASA                            Grade Assessment: II - A patient with mild systemic                            disease. After reviewing the risks and benefits,                            the patient was deemed in satisfactory condition to  undergo the procedure.                           After obtaining informed consent, the colonoscope                            was passed under direct vision. Throughout the                            procedure, the patient's blood pressure, pulse, and                            oxygen saturations were monitored continuously. The                            Olympus  Scope [id number] was introduced through the                            anus and advanced to the the ileocolonic                            anastomosis. The colonoscopy was performed without                            difficulty. The patient tolerated the procedure                            well. The quality of the bowel preparation was                            adequate. Ileocolonic anastomosis, neoterminal                            ileum were photographed. The bowel preparation used                            was Miralax and SUPREP via extended prep with split                            dose instruction. Scope In: 8:34:31 AM Scope Out: 8:47:23 AM Scope Withdrawal Time: 0 hours 8 minutes 24 seconds  Total Procedure Duration: 0 hours 12 minutes 52 seconds  Findings:                 The perianal and digital rectal examinations were                            normal. Pertinent negatives include normal                            sphincter tone and no palpable rectal lesions.                           There was evidence of a prior side-to-side  ileo-colonic anastomosis in the ascending colon.                            This was patent and was characterized by healthy                            appearing mucosa. The anastomosis was traversed.                           The exam was otherwise normal throughout the                            examined colon.                           The neo-terminal ileum appeared normal.                           Anal papilla(e) were hypertrophied.                           No additional abnormalities were found on                            retroflexion. Complications:            No immediate complications. Estimated Blood Loss:     Estimated blood loss: none. Impression:               - Patent side-to-side ileo-colonic anastomosis,                            characterized by healthy appearing mucosa.                           -  The examined portion of the ileum was normal.                           - Anal papilla(e) were hypertrophied.                           - No specimens collected. Recommendation:           - Patient has a contact number available for                            emergencies. The signs and symptoms of potential                            delayed complications were discussed with the                            patient. Return to normal activities tomorrow.                            Written discharge instructions were provided to the  patient.                           - Resume previous diet.                           - Continue present medications.                           - Repeat colonoscopy in 3 years for surveillance. Arleta Ostrum E. Tomasa Rand, MD 08/01/2023 8:56:52 AM This report has been signed electronically.

## 2023-08-01 NOTE — Telephone Encounter (Signed)
Pharmacy Patient Advocate Encounter  Received notification from  RX benefits  that Prior Authorization for methylphenidate has been APPROVED from 07/30/23 to 07/28/25   PA #/Case ID/Reference #: [id number]

## 2023-08-01 NOTE — Progress Notes (Signed)
Vss nad trans to pacu 

## 2023-08-01 NOTE — Patient Instructions (Signed)
Resume previous diet.  Continue present medications.  Repeat colonoscopy in 3 years for surveillance.   YOU HAD AN ENDOSCOPIC PROCEDURE TODAY AT [facility]:   Refer to the procedure report that was given to you for any specific questions about what was found during the examination.  If the procedure report does not answer your questions, please call your gastroenterologist to clarify.  If you requested that your care partner not be given the details of your procedure findings, then the procedure report has been included in a sealed envelope for you to review at your convenience later.  YOU SHOULD EXPECT: Some feelings of bloating in the abdomen. Passage of more gas than usual.  Walking can help get rid of the air that was put into your GI tract during the procedure and reduce the bloating. If you had a lower endoscopy (such as a colonoscopy or flexible sigmoidoscopy) you may notice spotting of blood in your stool or on the toilet paper. If you underwent a bowel prep for your procedure, you may not have a normal bowel movement for a few days.  Please Note:  You might notice some irritation and congestion in your nose or some drainage.  This is from the oxygen used during your procedure.  There is no need for concern and it should clear up in a day or so.  SYMPTOMS TO REPORT IMMEDIATELY:  Following lower endoscopy (colonoscopy or flexible sigmoidoscopy):  Excessive amounts of blood in the stool  Significant tenderness or worsening of abdominal pains  Swelling of the abdomen that is new, acute  Fever of 100F or higher  For urgent or emergent issues, a gastroenterologist can be reached at any hour by calling [phone]. Do not use MyChart messaging for urgent concerns.    DIET:  We do recommend a small meal at first, but then you may proceed to your regular diet.  Drink plenty of fluids but you should avoid alcoholic beverages for 24 hours.  ACTIVITY:  You should plan to  take it easy for the rest of today and you should NOT DRIVE or use heavy machinery until tomorrow (because of the sedation medicines used during the test).    FOLLOW UP: Our staff will call the number listed on your records the next business day following your procedure.  We will call around 7:15- 8:00 am to check on you and address any questions or concerns that you may have regarding the information given to you following your procedure. If we do not reach you, we will leave a message.     If any biopsies were taken you will be contacted by phone or by letter within the next 1-3 weeks.  Please call us at [phone] if you have not heard about the biopsies in 3 weeks.    SIGNATURES/CONFIDENTIALITY: You and/or your care partner have signed paperwork which will be entered into your electronic medical record.  These signatures attest to the fact that that the information above on your After Visit Summary has been reviewed and is understood.  Full responsibility of the confidentiality of this discharge information lies with you and/or your care-partner.

## 2023-08-02 ENCOUNTER — Telehealth: Payer: Self-pay | Admitting: *Deleted

## 2023-08-02 NOTE — Telephone Encounter (Signed)
  Follow up Call-     08/01/2023    7:30 AM 07/31/2022    7:12 AM 07/12/2022    1:50 PM  Call back number  Post procedure Call Back phone  # [phone]  Permission to leave phone message Yes Yes Yes     Patient questions:  Do you have a fever, pain , or abdominal swelling? No. Pain Score  0 *  Have you tolerated food without any problems? Yes.    Have you been able to return to your normal activities? Yes.    Do you have any questions about your discharge instructions: Diet   No. Medications  No. Follow up visit  No.  Do you have questions or concerns about your Care? No.  Actions: * If pain score is 4 or above: No action needed, pain <4.

## 2023-08-09 ENCOUNTER — Other Ambulatory Visit: Payer: Self-pay | Admitting: Family Medicine

## 2023-08-09 DIAGNOSIS — R1013 Epigastric pain: Secondary | ICD-10-CM

## 2023-08-12 ENCOUNTER — Encounter: Payer: Self-pay | Admitting: *Deleted

## 2023-08-13 LAB — HM MAMMOGRAPHY

## 2023-08-24 ENCOUNTER — Other Ambulatory Visit: Payer: Self-pay | Admitting: Physician Assistant

## 2023-08-24 DIAGNOSIS — J4540 Moderate persistent asthma, uncomplicated: Secondary | ICD-10-CM

## 2023-08-24 DIAGNOSIS — U071 COVID-19: Secondary | ICD-10-CM

## 2023-08-27 ENCOUNTER — Ambulatory Visit: Payer: Commercial Managed Care - PPO | Admitting: Family Medicine

## 2023-08-27 ENCOUNTER — Encounter: Payer: Self-pay | Admitting: Family Medicine

## 2023-08-27 VITALS — BP 126/80 | HR 71 | Temp 98.1°F | Resp 18 | Ht 66.0 in | Wt 165.4 lb

## 2023-08-27 DIAGNOSIS — Z23 Encounter for immunization: Secondary | ICD-10-CM

## 2023-08-27 DIAGNOSIS — F988 Other specified behavioral and emotional disorders with onset usually occurring in childhood and adolescence: Secondary | ICD-10-CM

## 2023-08-27 MED ORDER — METHYLPHENIDATE HCL 10 MG PO TABS
10.0000 mg | ORAL_TABLET | Freq: Three times a day (TID) | ORAL | 0 refills | Status: DC
Start: 2023-08-27 — End: 2023-09-24

## 2023-08-27 NOTE — Patient Instructions (Signed)
Living With Attention Deficit Hyperactivity Disorder If you have been diagnosed with attention deficit hyperactivity disorder (ADHD), you may be relieved that you now know why you have felt or behaved a certain way. Still, you may feel overwhelmed about the treatment ahead. You may also wonder how to get the support you need and how to deal with the condition day-to-day. With treatment and support, you can live with ADHD and manage your symptoms. How to manage lifestyle changes Managing lifestyle changes can be challenging. Seeking support from your healthcare provider, therapist, family, and friends can be helpful. How to recognize changes in your condition The following signs may mean that your treatment is working well and your condition is improving: Consistently being on time for appointments. Being more organized at home and work. Other people noticing improvements in your behavior. Achieving goals that you set for yourself. Thinking more clearly. The following signs may mean that your treatment is not working very well: Feeling impatience or more confusion. Missing, forgetting, or being late for appointments. An increasing sense of disorganization and messiness. More difficulty in reaching goals that you set for yourself. Loved ones becoming angry or frustrated with you. Follow these instructions at home: Medicines Take over-the-counter and prescription medicines only as told by your health care provider. Check with your health care provider before taking any new medicines. General instructions Create structure and an organized atmosphere at home. For example: Make a list of tasks, then rank them from most important to least important. Work on one task at a time until your listed tasks are done. Make a daily schedule and follow it consistently every day. Use an appointment calendar, and check it 2-3 times a day to keep on track. Keep it with you when you leave the house. Create  spaces where you keep certain things, and always put things back in their places after you use them. Keep all follow-up visits. Your health care provider will need to monitor your condition and adjust your treatment over time. Where to find support Talking to others  Keep emotion out of important discussions and speak in a calm, logical way. Listen closely and patiently to your loved ones. Try to understand their point of view, and try to avoid getting defensive. Take responsibility for the consequences of your actions. Ask that others do not take your behaviors personally. Aim to solve problems as they come up, and express your feelings instead of bottling them up. Talk openly about what you need from your loved ones and how they can support you. Consider going to family therapy sessions or having your family meet with a specialist who deals with ADHD-related behavior problems. Finances Not all insurance plans cover mental health care, so it is important to check with your insurance carrier. If paying for co-pays or counseling services is a problem, search for a local or county mental health care center. Public mental health care services may be offered there at a low cost or no cost when you are not able to see a private health care provider. If you are taking medicine for ADHD, you may be able to get the generic form, which may be less expensive than brand-name medicine. Some makers of prescription medicines also offer help to patients who cannot afford the medicines that they need. Therapy and support groups Talking with a mental health care provider and participating in support groups can help to improve your quality of life, daily functioning, and overall symptoms. Questions to ask your health   care provider: What are the risks and benefits of taking medicines? Would I benefit from therapy? How often should I follow up with a health care provider? Where to find more information Learn more  about ADHD from: Children and Adults with Attention Deficit Hyperactivity Disorder: chadd.org National Institute of Mental Health: nimh.nih.gov Centers for Disease Control and Prevention: cdc.gov Contact a health care provider if: You have side effects from your medicines, such as: Repeated muscle twitches, coughing, or speech outbursts. Sleep problems. Loss of appetite. Dizziness. Unusually fast heartbeat. Stomach pains. Headaches. You have new or worsening behavior problems. You are struggling with anxiety, depression, or substance abuse. Get help right away if: You have a severe reaction to a medicine. These symptoms may be an emergency. Get help right away. Call 911. Do not wait to see if the symptoms will go away. Do not drive yourself to the hospital. Take one of these steps if you feel like you may hurt yourself or others, or have thoughts about taking your own life: Go to your nearest emergency room. Call 911. Call the National Suicide Prevention Lifeline at 1-800-273-8255 or 988. This is open 24 hours a day. Text the Crisis Text Line at 741741. Summary With treatment and support, you can live with ADHD and manage your symptoms. Consider taking part in family therapy or self-help groups with family members or friends. When you talk with friends and family about your ADHD, be patient and communicate openly. Keep all follow-up visits. Your health care provider will need to monitor your condition and adjust your treatment over time. This information is not intended to replace advice given to you by your health care provider. Make sure you discuss any questions you have with your health care provider. Document Revised: 01/05/2022 Document Reviewed: 01/05/2022 Elsevier Patient Education  2024 Elsevier Inc.  

## 2023-08-27 NOTE — Progress Notes (Addendum)
 Established Patient Office Visit  Subjective   Patient ID: Shelby Warren, female    DOB: [birth year]  Age: 57 y.o. MRN: [id number]  Chief Complaint  Patient presents with   Follow-up    Concerns/ questions: none Flu shot today: yes Shingrix: none yet    HPI Discussed the use of AI scribe software for clinical note transcription with the patient, who gave verbal consent to proceed.  History of Present Illness   The patient, with a history of cancer, reports having completed a one-year colonoscopy which was clear. The patient also mentions having contracted COVID-19 a couple of months ago, but has since recovered.  The patient has been trying to adjust to a new normal after her cancer treatment, which has included changes in her social activities. She mentions a decrease in her attendance at state games, partly due to her treatment last year and partly due to changes in ticket pricing and seating arrangements.  The patient also reports that she is due for a flu shot, which she plans to get during her current visit. She is currently on Ritalin, for which she will need a refill soon. She also takes Synthroid for her thyroid condition, which she reports is well-managed with no current signs of acting up.  The patient's spouse recently had back surgery, which has increased the patient's physical activity as she is now responsible for all household tasks. This has resulted in some back pain for the patient. Despite these challenges, the patient feels she is doing well overall.      Patient Active Problem List   Diagnosis Date Noted   Cancer of right colon (HCC) 04/20/2022   Colonic mass 04/11/2022   Acute appendicitis 04/11/2022   Hot flashes 10/24/2021   Iron deficiency anemia 10/24/2021   Morbid obesity (HCC) 04/14/2019   Acne vulgaris 01/04/2017   Idiopathic urticaria 09/02/2012   Anxiety 07/17/2012   B12 deficiency 07/17/2012   ACUTE PHARYNGITIS 05/07/2008   GERD 03/24/2008    Allergic asthma, mild intermittent, uncomplicated 11/06/2007   Hypothyroidism 01/21/2007   Attention deficit disorder 01/21/2007   Seasonal and perennial allergic rhinitis 01/21/2007   EYE SURGERY, HX OF 01/21/2007   Past Medical History:  Diagnosis Date   ADD (attention deficit disorder)    Allergic rhinitis    Asthma    B12 deficiency    Cancer (HCC)    Hypothyroidism    Past Surgical History:  Procedure Laterality Date   ANAL FISSURE REPAIR     APPENDECTOMY N/A 04/12/2022   Procedure: APPENDECTOMY;  Surgeon: Fritzi Mandes, MD;  Location: [facility] OR;  Service: General;  Laterality: N/A;   LAPAROSCOPIC RIGHT HEMI COLECTOMY N/A 04/12/2022   Procedure: LAPAROSCOPIC RIGHT HEMI COLECTOMY AND TERMINAL ILEUM;  Surgeon: Fritzi Mandes, MD;  Location: [facility] OR;  Service: General;  Laterality: N/A;   laproscopic knee Right 2018   lasic eye surgery     LIVER BIOPSY Right 04/12/2022   Procedure: LIVER BIOPSY;  Surgeon: Fritzi Mandes, MD;  Location: [facility] OR;  Service: General;  Laterality: Right;   spincture muscle     TOTAL ABDOMINAL HYSTERECTOMY  07/04/2020   dr Vincente Poli   Social History   Tobacco Use   Smoking status: Never   Smokeless tobacco: Never  Vaping Use   Vaping status: Never Used  Substance Use Topics   Alcohol use: Yes    Comment: occasional    Drug use: No   Social History   Socioeconomic History  Marital status: Married    Spouse name: Not on file   Number of children: 2   Years of education: Not on file   Highest education level: Not on file  Occupational History    Employer: [employer]   Occupation: Homemaker  Tobacco Use   Smoking status: Never   Smokeless tobacco: Never  Vaping Use   Vaping status: Never Used  Substance and Sexual Activity   Alcohol use: Yes    Comment: occasional    Drug use: No   Sexual activity: Not on file  Other Topics Concern   Not on file  Social History Narrative   Not on file   Social Drivers of Health    Financial Resource Strain: Not on file  Food Insecurity: Not on file  Transportation Needs: Not on file  Physical Activity: Not on file  Stress: Not on file  Social Connections: Not on file  Intimate Partner Violence: Not on file   Family Status  Relation Name Status   MGF  (Not Specified)   PGM  (Not Specified)   Neg Hx  (Not Specified)  No partnership data on file   Family History  Problem Relation Age of Onset   Diabetes Maternal Grandfather    Breast cancer Paternal Grandmother    Thyroid disease Paternal Grandmother    Colon cancer Neg Hx    Esophageal cancer Neg Hx    Rectal cancer Neg Hx    Stomach cancer Neg Hx    Colon polyps Neg Hx    Allergies  Allergen Reactions   Penicillins Hives and Swelling   Fish-Derived Products    Penicillin G Other (See Comments)   Strawberry Extract       Review of Systems  Constitutional:  Negative for fever and malaise/fatigue.  HENT:  Negative for congestion.   Eyes:  Negative for blurred vision.  Respiratory:  Negative for shortness of breath.   Cardiovascular:  Negative for chest pain, palpitations and leg swelling.  Gastrointestinal:  Negative for abdominal pain, blood in stool and nausea.  Genitourinary:  Negative for dysuria and frequency.  Musculoskeletal:  Negative for falls.  Skin:  Negative for rash.  Neurological:  Negative for dizziness, loss of consciousness and headaches.  Endo/Heme/Allergies:  Negative for environmental allergies.  Psychiatric/Behavioral:  Negative for depression. The patient is not nervous/anxious.       Objective:     BP 126/80 (BP Location: Left Arm, Patient Position: Sitting, Cuff Size: Normal)   Pulse 71   Temp 98.1 F (36.7 C) (Temporal)   Resp 18   Ht 5\' 6"  (1.676 m)   Wt 165 lb 6.4 oz (75 kg)   LMP 07/31/2018   SpO2 97%   BMI 26.70 kg/m  BP Readings from Last 3 Encounters:  10/31/23 (!) 144/74  10/18/23 132/84  08/27/23 126/80   Wt Readings from Last 3 Encounters:   10/18/23 162 lb 12.8 oz (73.8 kg)  08/27/23 165 lb 6.4 oz (75 kg)  08/01/23 160 lb (72.6 kg)   SpO2 Readings from Last 3 Encounters:  10/18/23 100%  08/27/23 97%  08/01/23 96%      Physical Exam Vitals and nursing note reviewed.  Constitutional:      General: She is not in acute distress.    Appearance: Normal appearance. She is well-developed.  HENT:     Head: Normocephalic and atraumatic.  Eyes:     General: No scleral icterus.       Right eye:  No discharge.        Left eye: No discharge.  Cardiovascular:     Rate and Rhythm: Normal rate and regular rhythm.     Heart sounds: No murmur heard. Pulmonary:     Effort: Pulmonary effort is normal. No respiratory distress.     Breath sounds: Normal breath sounds.  Musculoskeletal:        General: Normal range of motion.     Cervical back: Normal range of motion and neck supple.     Right lower leg: No edema.     Left lower leg: No edema.  Skin:    General: Skin is warm and dry.  Neurological:     Mental Status: She is alert and oriented to person, place, and time.  Psychiatric:        Mood and Affect: Mood normal.        Behavior: Behavior normal.        Thought Content: Thought content normal.        Judgment: Judgment normal.      No results found for any visits on 08/27/23.  Last CBC Lab Results  Component Value Date   WBC 3.9 (L) 11/12/2022   HGB 13.0 11/12/2022   HCT 38.5 11/12/2022   MCV 97.2 11/12/2022   MCH 32.8 11/12/2022   RDW 12.7 11/12/2022   PLT 249 11/12/2022   Last metabolic panel Lab Results  Component Value Date   GLUCOSE 133 (H) 04/15/2023   NA 141 04/15/2023   K 4.0 04/15/2023   CL 106 04/15/2023   CO2 28 04/15/2023   BUN 10 04/15/2023   CREATININE 0.85 04/15/2023   GFRNONAA >60 04/15/2023   CALCIUM 9.5 04/15/2023   PROT 7.0 11/12/2022   ALBUMIN 4.3 11/12/2022   BILITOT 0.7 11/12/2022   ALKPHOS 30 (L) 11/12/2022   AST 18 11/12/2022   ALT 16 11/12/2022   ANIONGAP 7 04/15/2023    Last lipids Lab Results  Component Value Date   CHOL 200 07/04/2012   HDL 61.30 07/04/2012   LDLCALC 123 (H) 07/04/2012   TRIG 77.0 07/04/2012   CHOLHDL 3 07/04/2012   Last hemoglobin A1c Lab Results  Component Value Date   HGBA1C 4.8 04/12/2022   Last thyroid functions Lab Results  Component Value Date   TSH 0.30 (L) 11/22/2023   T4TOTAL 11.8 11/22/2023   Last vitamin D No results found for: "25OHVITD2", "25OHVITD3", "VD25OH" Last vitamin B12 and Folate Lab Results  Component Value Date   VITAMINB12 >1500 (H) 05/13/2015      The ASCVD Risk score (Arnett DK, et al., 2019) failed to calculate for the following reasons:   Cannot find a previous HDL lab   Cannot find a previous total cholesterol lab    Assessment & Plan:   Problem List Items Addressed This Visit   None Visit Diagnoses       Attention deficit disorder (ADD) without hyperactivity    -  Primary     Immunization due       Relevant Orders   Flu vaccine trivalent PF, 6mos and older(Flulaval,Afluria,Fluarix,Fluzone) (Completed)       Return in about 6 months (around 02/24/2024), or if symptoms worsen or fail to improve, for annual exam, fasting.    Donato Schultz, DO

## 2023-09-09 ENCOUNTER — Other Ambulatory Visit: Payer: Self-pay | Admitting: Family Medicine

## 2023-09-20 IMAGING — DX DG CHEST 2V
2 series · 2 of 2 positions shown · non-contrast
Comparison: 01/22/2004.

CLINICAL DATA: History of cough.

EXAM:
CHEST - 2 VIEW

[chest pa]
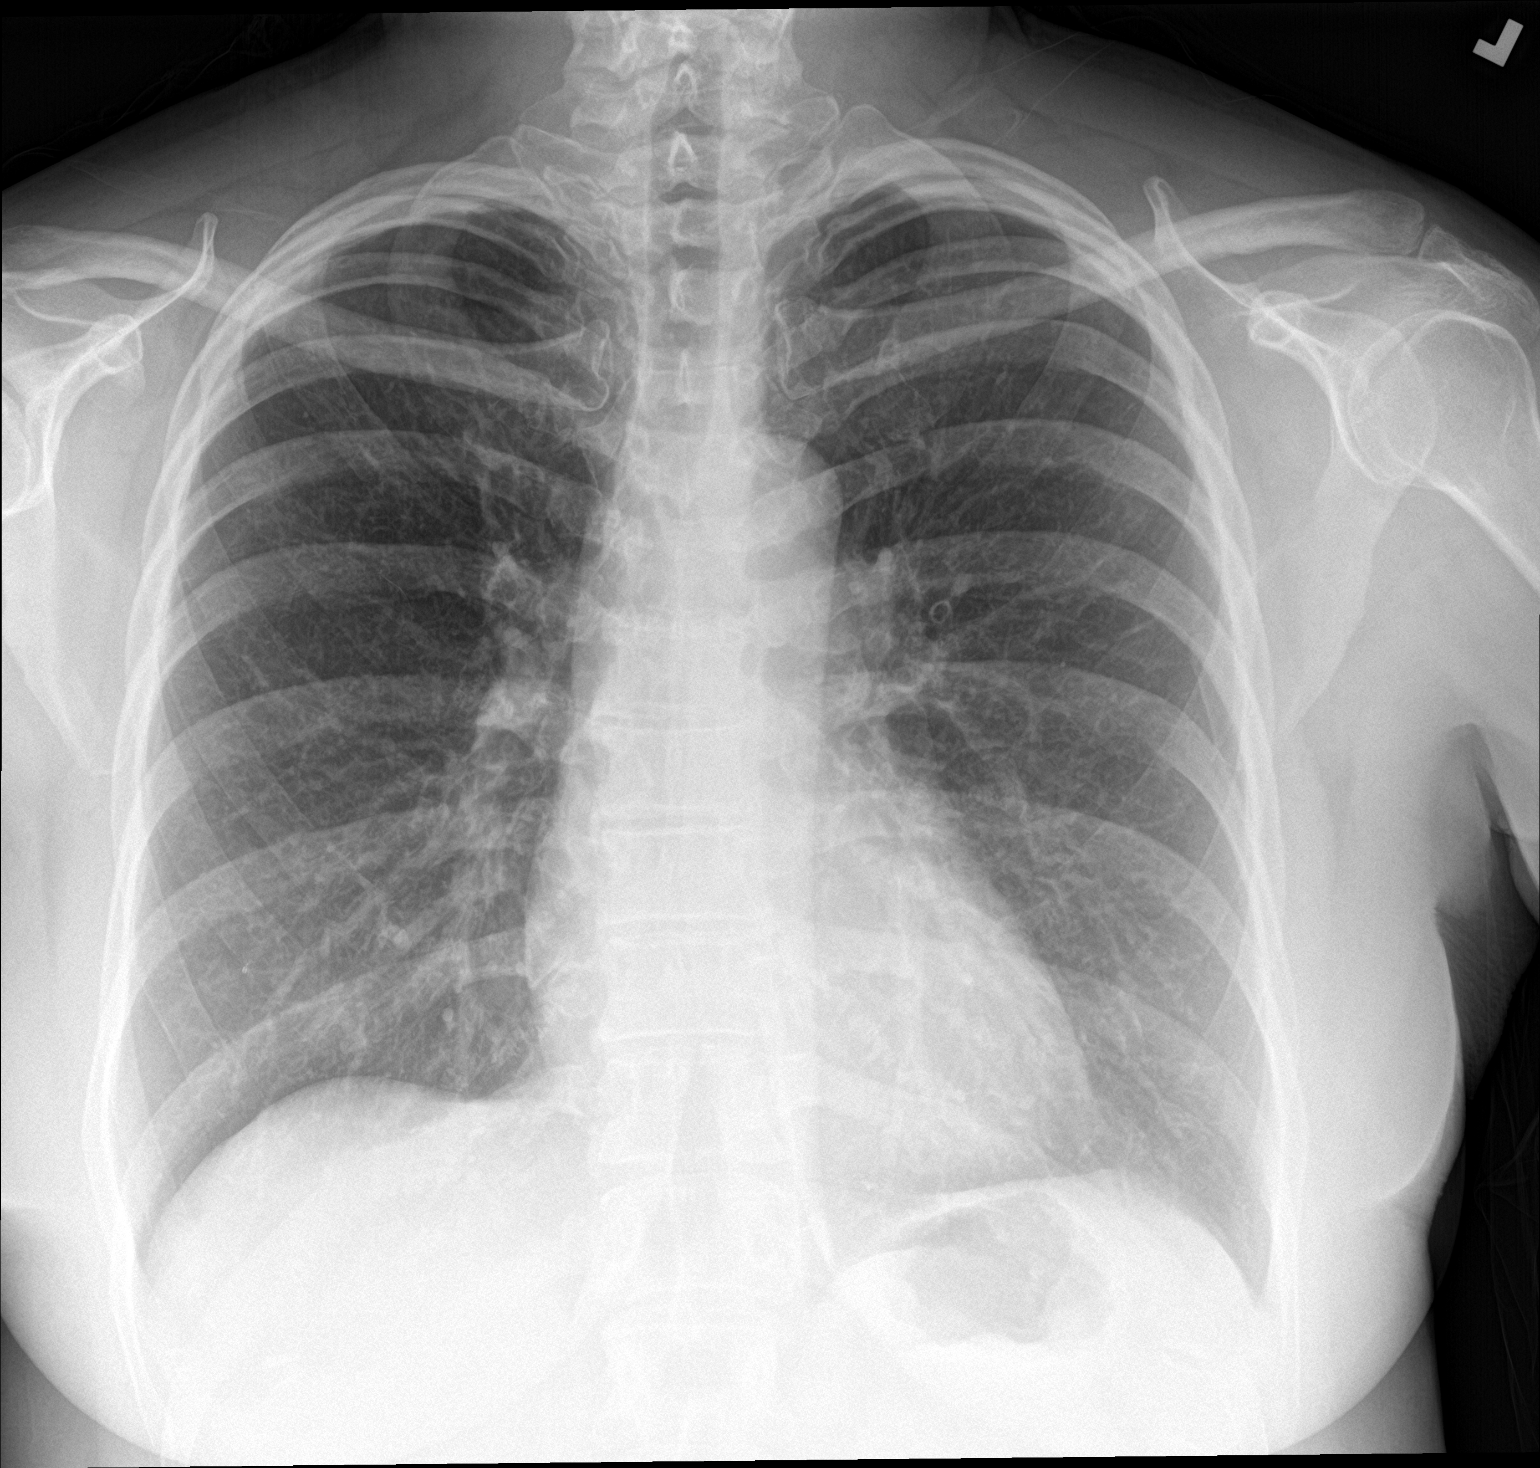

[chest lat]
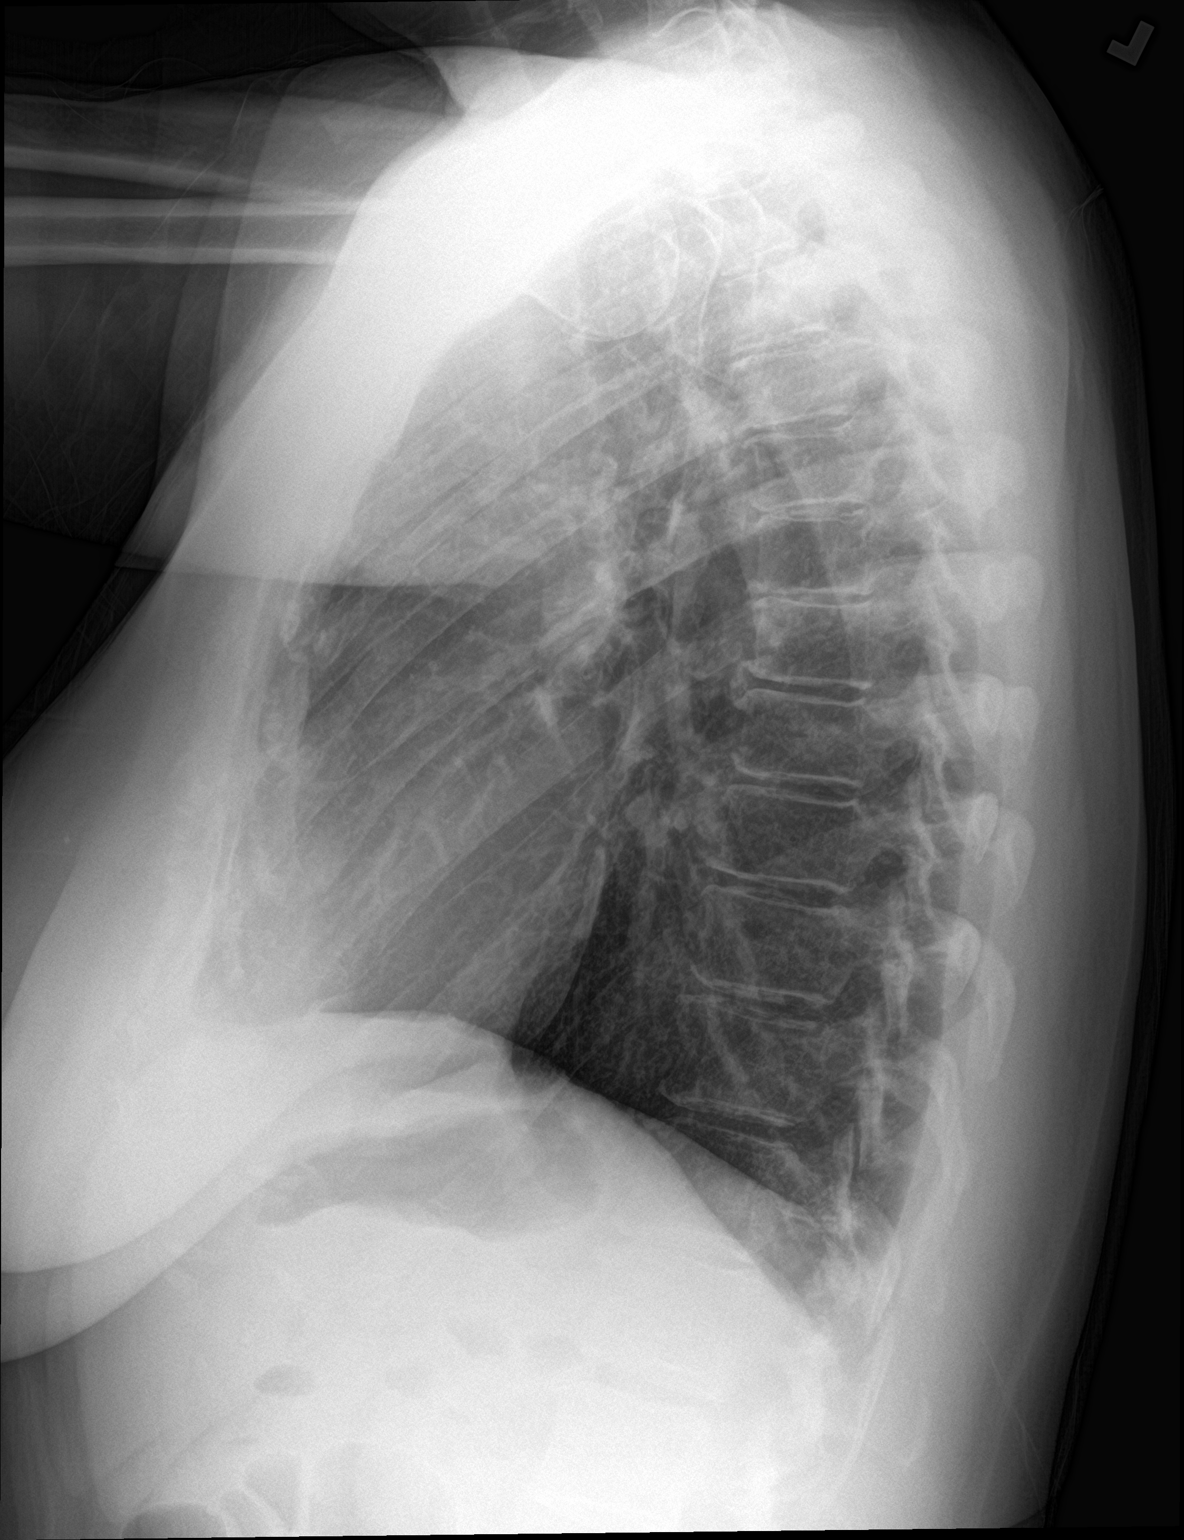

[2 of 2 positions shown; findings below may reference images not displayed]

FINDINGS: Mediastinum and hilar structures normal. Heart size normal. No focal
infiltrate. No pleural effusion or pneumothorax. Mild scoliosis and
degenerative change thoracic spine.
IMPRESSION: No acute cardiopulmonary disease.

## 2023-09-24 ENCOUNTER — Other Ambulatory Visit: Payer: Self-pay | Admitting: Family Medicine

## 2023-09-24 DIAGNOSIS — F988 Other specified behavioral and emotional disorders with onset usually occurring in childhood and adolescence: Secondary | ICD-10-CM

## 2023-09-24 MED ORDER — METHYLPHENIDATE HCL 10 MG PO TABS
10.0000 mg | ORAL_TABLET | Freq: Three times a day (TID) | ORAL | 0 refills | Status: DC
Start: 2023-09-24 — End: 2023-10-28

## 2023-09-24 NOTE — Telephone Encounter (Signed)
Requesting: methylphenidate 10mg   Contract: 12/03/22 UDS: 12/03/22 Last Visit: 08/27/23 Next Visit: None Last Refill: 08/27/23 #90 and 0RF   Please Advise

## 2023-10-18 ENCOUNTER — Other Ambulatory Visit: Payer: Commercial Managed Care - PPO

## 2023-10-18 ENCOUNTER — Inpatient Hospital Stay: Payer: Commercial Managed Care - PPO | Attending: Oncology | Admitting: Oncology

## 2023-10-18 ENCOUNTER — Inpatient Hospital Stay: Payer: Commercial Managed Care - PPO

## 2023-10-18 VITALS — BP 132/84 | HR 80 | Temp 98.2°F | Resp 18 | Ht 66.0 in | Wt 162.8 lb

## 2023-10-18 DIAGNOSIS — J45909 Unspecified asthma, uncomplicated: Secondary | ICD-10-CM | POA: Diagnosis not present

## 2023-10-18 DIAGNOSIS — Z9221 Personal history of antineoplastic chemotherapy: Secondary | ICD-10-CM | POA: Diagnosis not present

## 2023-10-18 DIAGNOSIS — C189 Malignant neoplasm of colon, unspecified: Secondary | ICD-10-CM | POA: Diagnosis not present

## 2023-10-18 DIAGNOSIS — Z9049 Acquired absence of other specified parts of digestive tract: Secondary | ICD-10-CM | POA: Diagnosis not present

## 2023-10-18 DIAGNOSIS — E039 Hypothyroidism, unspecified: Secondary | ICD-10-CM | POA: Diagnosis not present

## 2023-10-18 DIAGNOSIS — Z85038 Personal history of other malignant neoplasm of large intestine: Secondary | ICD-10-CM | POA: Insufficient documentation

## 2023-10-18 LAB — CEA (ACCESS): CEA (CHCC): 2.31 ng/mL (ref 0.00–5.00)

## 2023-10-18 NOTE — Progress Notes (Signed)
[facility] OFFICE PROGRESS NOTE   Diagnosis: Colon cancer  INTERVAL HISTORY:   Ms. Shelby Warren returns as scheduled.  She feels well.  Good appetite.  No difficulty with bowel function.  No bleeding.  She underwent a colonoscopy 08/01/2023.  No polyps.  The skin changes at the hands and feet have improved.  She reports mild swelling in the fingers when she is hot.  The hand and foot changes do not interfere with activity.  Objective:  Vital signs in last 24 hours:  Blood pressure 132/84, pulse 80, temperature 98.2 F (36.8 C), temperature source Temporal, resp. rate 18, height 5\' 6"  (1.676 m), weight 162 lb 12.8 oz (73.8 kg), last menstrual period 07/31/2018, SpO2 100%.    Lymphatics: No cervical, supraclavicular, axillary, or inguinal nodes Resp: Lungs clear bilaterally Cardio: Regular rate and rhythm GI: No hepatosplenomegaly, no mass, nontender Vascular: No leg edema  Skin: Mild dryness of the hands, skin thickening at the fingertips.  Callus formation and dryness of the soles.  Portacath/PICC-without erythema  Lab Results:  Lab Results  Component Value Date   WBC 3.9 (L) 11/12/2022   HGB 13.0 11/12/2022   HCT 38.5 11/12/2022   MCV 97.2 11/12/2022   PLT 249 11/12/2022   NEUTROABS 1.5 (L) 11/12/2022    CMP  Lab Results  Component Value Date   NA 141 04/15/2023   K 4.0 04/15/2023   CL 106 04/15/2023   CO2 28 04/15/2023   GLUCOSE 133 (H) 04/15/2023   BUN 10 04/15/2023   CREATININE 0.85 04/15/2023   CALCIUM 9.5 04/15/2023   PROT 7.0 11/12/2022   ALBUMIN 4.3 11/12/2022   AST 18 11/12/2022   ALT 16 11/12/2022   ALKPHOS 30 (L) 11/12/2022   BILITOT 0.7 11/12/2022   GFRNONAA >60 04/15/2023    Lab Results  Component Value Date   CEA1 22.0 (H) 04/12/2022   CEA 1.61 04/15/2023     Medications: I have reviewed the patient's current medications.   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, stage IIa (T3N0), status post a right colectomy 04/12/2022  No macroscopic tumor perforation, negative resection margins 0/31 nodes, no tumor deposits, no lymphovascular perineural invasion, no loss of mismatch repair protein expression CT abdomen/pelvis 04/11/2022-cecal mass with obstruction of the ostium of the appendix with evidence of acute appendicitis, lymphadenopathy in the ileocolic mesentery CT chest 04/13/2022-no evidence of metastatic disease, pneumoperitoneum and trace bilateral pleural effusions felt to be secondary to surgery Elevated preoperative CEA; CEA in normal range 05/14/2022 (1.83) Cycle 1 Xeloda 05/14/2022 Cycle 2 Xeloda 06/04/2022 Cycle 3 Xeloda 06/25/2022 Cycle 4 Xeloda 07/16/2022 Colonoscopy 07/31/2022-melanosis in the colon; patent side-to-side ileocolonic anastomosis; no specimens collected Cycle 5 Xeloda 08/06/2022 Cycle 6 Xeloda 08/27/2022, dose reduction secondary to hand/foot syndrome Cycle 7 Xeloda 09/17/2022 Cycle 8 Xeloda 10/04/2022 CT 04/15/2023-no evidence of recurrent disease Colonoscopy 08/01/2023-negative   Asthma ADD G2 P2 Hysterectomy Hypothyroid History of an anal fissure History of a colon polyp-sigmoid colon polyp removed 05/29/2017-mucosal prolapse type polyp, melanosis coli, no dysplasia or malignancy Hand-foot syndrome secondary to Xeloda-persistent despite the addition of Voltaren gel Nausea secondary to Xeloda-Zofran added beginning with cycle 6     Disposition: Shelby Warren is in clinical remission from colon cancer.  We will follow-up on the CEA from today.  She will be scheduled for surveillance CTs and an office visit in 6 months. She will discontinue iron and B complex vitamin.  She will discuss  the indication for continuing vitamin D with Dr. Laury Axon. Thornton Papas, MD  10/18/2023  8:03 AM

## 2023-10-27 ENCOUNTER — Encounter: Payer: Self-pay | Admitting: Family Medicine

## 2023-10-28 ENCOUNTER — Other Ambulatory Visit: Payer: Self-pay | Admitting: Family Medicine

## 2023-10-28 DIAGNOSIS — F988 Other specified behavioral and emotional disorders with onset usually occurring in childhood and adolescence: Secondary | ICD-10-CM

## 2023-10-28 MED ORDER — METHYLPHENIDATE HCL 10 MG PO TABS: 10.0000 mg | ORAL_TABLET | Freq: Three times a day (TID) | ORAL | 0 refills | Status: DC

## 2023-10-29 ENCOUNTER — Other Ambulatory Visit: Payer: Self-pay | Admitting: Family Medicine

## 2023-10-29 DIAGNOSIS — G4700 Insomnia, unspecified: Secondary | ICD-10-CM

## 2023-10-31 ENCOUNTER — Ambulatory Visit: Payer: Commercial Managed Care - PPO | Admitting: Dermatology

## 2023-10-31 ENCOUNTER — Encounter: Payer: Self-pay | Admitting: Dermatology

## 2023-10-31 VITALS — BP 144/74 | HR 81

## 2023-10-31 DIAGNOSIS — Z872 Personal history of diseases of the skin and subcutaneous tissue: Secondary | ICD-10-CM

## 2023-10-31 DIAGNOSIS — Z1283 Encounter for screening for malignant neoplasm of skin: Secondary | ICD-10-CM | POA: Diagnosis not present

## 2023-10-31 DIAGNOSIS — W908XXA Exposure to other nonionizing radiation, initial encounter: Secondary | ICD-10-CM | POA: Diagnosis not present

## 2023-10-31 DIAGNOSIS — L568 Other specified acute skin changes due to ultraviolet radiation: Secondary | ICD-10-CM

## 2023-10-31 DIAGNOSIS — L814 Other melanin hyperpigmentation: Secondary | ICD-10-CM | POA: Diagnosis not present

## 2023-10-31 DIAGNOSIS — L821 Other seborrheic keratosis: Secondary | ICD-10-CM

## 2023-10-31 DIAGNOSIS — L578 Other skin changes due to chronic exposure to nonionizing radiation: Secondary | ICD-10-CM

## 2023-10-31 DIAGNOSIS — L570 Actinic keratosis: Secondary | ICD-10-CM

## 2023-10-31 DIAGNOSIS — L271 Localized skin eruption due to drugs and medicaments taken internally: Secondary | ICD-10-CM

## 2023-10-31 DIAGNOSIS — D1801 Hemangioma of skin and subcutaneous tissue: Secondary | ICD-10-CM

## 2023-10-31 NOTE — Progress Notes (Signed)
New Patient Visit   Subjective  Shelby Warren is a 58 y.o. female who presents for the following: Skin Cancer Screening and Full Body Skin Exam.  No hx or family hx of skin cancer.  The patient presents for Total-Body Skin Exam (TBSE) for skin cancer screening and mole check. The patient has spots, moles and lesions to be evaluated, some may be new or changing. She finished chemotherapy for colorectal cancer a year ago. She states that she gets a rash on her chest and face when exposed to the sun since she finished her chemotherapy (capecitabine)  She also has a spot of concern on her left cheek, present for several months, scaly at times.  The following portions of the chart were reviewed this encounter and updated as appropriate: medications, allergies, medical history  Review of Systems:  No other skin or systemic complaints except as noted in HPI or Assessment and Plan.  Objective  Well appearing patient in no apparent distress; mood and affect are within normal limits.  A full examination was performed including scalp, head, eyes, ears, nose, lips, neck, chest, axillae, abdomen, back, buttocks, bilateral upper extremities, bilateral lower extremities, hands, feet, fingers, toes, fingernails, and toenails. All findings within normal limits unless otherwise noted below.   Relevant physical exam findings are noted in the Assessment and Plan.    Assessment & Plan   SKIN CANCER SCREENING PERFORMED TODAY.  ACTINIC DAMAGE - Chronic condition, secondary to cumulative UV/sun exposure - diffuse scaly erythematous macules with underlying dyspigmentation - Recommend daily broad spectrum sunscreen SPF 30+ to sun-exposed areas, reapply every 2 hours as needed.  - Staying in the shade or wearing long sleeves, sun glasses (UVA+UVB protection) and wide brim hats (4-inch brim around the entire circumference of the hat) are also recommended for sun protection.  - Call for new or changing  lesions.  LENTIGINES, SEBORRHEIC KERATOSES, HEMANGIOMAS - Benign normal skin lesions - Benign-appearing - Call for any changes  MELANOCYTIC NEVI - Tan-brown and/or pink-flesh-colored symmetric macules and papules - Benign appearing on exam today - Observation - Call clinic for new or changing moles - Recommend daily use of broad spectrum spf 30+ sunscreen to sun-exposed areas.   ACTINIC KERATOSIS Exam: Erythematous thin papules/macules with gritty scale  Actinic keratoses are precancerous spots that appear secondary to cumulative UV radiation exposure/sun exposure over time. They are chronic with expected duration over 1 year. A portion of actinic keratoses will progress to squamous cell carcinoma of the skin. It is not possible to reliably predict which spots will progress to skin cancer and so treatment is recommended to prevent development of skin cancer.  Recommend daily broad spectrum sunscreen SPF 30+ to sun-exposed areas, reapply every 2 hours as needed.  Recommend staying in the shade or wearing long sleeves, sun glasses (UVA+UVB protection) and wide brim hats (4-inch brim around the entire circumference of the hat). Call for new or changing lesions.  Treatment Plan:  Prior to procedure, discussed risks of blister formation, small wound, skin dyspigmentation, or rare scar following cryotherapy. Recommend Vaseline ointment to treated areas while healing.  Destruction Procedure Note Destruction method: cryotherapy   Informed consent: discussed and consent obtained   Lesion destroyed using liquid nitrogen: Yes   Outcome: patient tolerated procedure well with no complications   Post-procedure details: wound care instructions given   Locations: left cheek # of Lesions Treated: 1  AK (ACTINIC KERATOSIS) Left Parotid Area Destruction of lesion - Left Parotid Area Complexity:  extensive   Destruction method: cryotherapy   Informed consent: discussed and consent obtained    Timeout:  patient name, date of birth, surgical site, and procedure verified Lesion destroyed using liquid nitrogen: Yes   Region frozen until ice ball extended beyond lesion: Yes   Cryotherapy cycles:  2 Outcome: patient tolerated procedure well with no complications   Post-procedure details: wound care instructions given    History of Hand Foot Syndrome 2/2 Capecitabine The patient was counseled regarding their previous history of hand-foot syndrome (palmar-plantar erythrodysesthesia) associated with capecitabine use. It was discussed that this condition can present with redness, swelling, tenderness, peeling, and, in severe cases, blistering and pain on the palms and soles. Given the prior occurrence, there is an increased risk of recurrence with future capecitabine treatment. Preventative measures, including regular moisturizing with thick emollients, avoiding excessive heat or friction on the hands and feet, and considering dose modifications if symptoms reoccur, were reviewed. The patient was advised to monitor for early signs of recurrence and to report any symptoms promptly for potential intervention. All questions were addressed, and the importance of proactive symptom management was emphasized  Drug Induced Photosensitivity- 2/2 Previous Capecitabine use- mottled pink patches and macules on the chest, upper back, shoulders, and face The patient was counseled on residual photosensitivity secondary to previous capecitabine use. It was discussed that capecitabine can cause increased skin sensitivity to ultraviolet (UV) radiation, which may persist even after discontinuation of the medication. The patient was advised to take strict sun protection measures, including daily use of broad-spectrum sunscreen with SPF 30 or higher, wearing protective clothing such as wide-brimmed hats and long sleeves, and avoiding prolonged sun exposure, especially during peak hours. The importance of monitoring for  any new or persistent skin changes, such as redness, blistering, or hyperpigmentation, was emphasized. The patient's questions were addressed, and ongoing vigilance in sun protection was strongly recommended.  Return in about 1 year (around 10/30/2024) for fbsc.  IBernette Redbird, Surg Tech III, am acting as scribe for Gwenith Daily, MD.   We spent 45 min reviewing records, taking the patient history, providing face to face care with the patient, sending prescriptions.  Documentation: I have reviewed the above documentation for accuracy and completeness, and I agree with the above.  Gwenith Daily, MD

## 2023-10-31 NOTE — Patient Instructions (Signed)
Important Information  Due to recent changes in healthcare laws, you may see results of your pathology and/or laboratory studies on MyChart before the doctors have had a chance to review them. We understand that in some cases there may be results that are confusing or concerning to you. Please understand that not all results are received at the same time and often the doctors may need to interpret multiple results in order to provide you with the best plan of care or course of treatment. Therefore, we ask that you please give [place] 2 business days to thoroughly review all your results before contacting the office for clarification. Should we see a critical lab result, you will be contacted sooner.   If You Need Anything After Your Visit  If you have any questions or concerns for your doctor, please call our main line at [phone] If no one answers, please leave a voicemail as directed and we will return your call as soon as possible. Messages left after 4 pm will be answered the following business day.   You may also send [place] a message via MyChart. We typically respond to MyChart messages within 1-2 business days.  For prescription refills, please ask your pharmacy to contact our office. Our fax number is [phone].  If you have an urgent issue when the clinic is closed that cannot wait until the next business day, you can page your doctor at the number below.    Please note that while we do our best to be available for urgent issues outside of office hours, we are not available 24/7.   If you have an urgent issue and are unable to reach [place], you may choose to seek medical care at your doctor's office, retail clinic, urgent care center, or emergency room.  If you have a medical emergency, please immediately call 911 or go to the emergency department. In the event of inclement weather, please call our main line at [phone] for an update on the status of any delays or  closures.  Dermatology Medication Tips: Please keep the boxes that topical medications come in in order to help keep track of the instructions about where and how to use these. Pharmacies typically print the medication instructions only on the boxes and not directly on the medication tubes.   If your medication is too expensive, please contact our office at [phone] or send [place] a message through MyChart.   We are unable to tell what your co-pay for medications will be in advance as this is different depending on your insurance coverage. However, we may be able to find a substitute medication at lower cost or fill out paperwork to get insurance to cover a needed medication.   If a prior authorization is required to get your medication covered by your insurance company, please allow [place] 1-2 business days to complete this process.  Drug prices often vary depending on where the prescription is filled and some pharmacies may offer cheaper prices.  The website www.goodrx.com contains coupons for medications through different pharmacies. The prices here do not account for what the cost may be with help from insurance (it may be cheaper with your insurance), but the website can give you the price if you did not use any insurance.  - You can print the associated coupon and take it with your prescription to the pharmacy.  - You may also stop by our office during regular business hours and pick up a GoodRx coupon card.  - If  you need your prescription sent electronically to a different pharmacy, notify our office through [facility] or by phone at [phone]    For areas treated with Liquid Nitrogen:  Keep clean with soap and water.  Apply Vaseline or Aquaphor twice daily.

## 2023-11-20 ENCOUNTER — Encounter: Payer: Self-pay | Admitting: Family Medicine

## 2023-11-21 ENCOUNTER — Other Ambulatory Visit: Payer: Self-pay | Admitting: Family Medicine

## 2023-11-21 DIAGNOSIS — E039 Hypothyroidism, unspecified: Secondary | ICD-10-CM

## 2023-11-22 ENCOUNTER — Other Ambulatory Visit (INDEPENDENT_AMBULATORY_CARE_PROVIDER_SITE_OTHER): Payer: Commercial Managed Care - PPO

## 2023-11-22 DIAGNOSIS — E039 Hypothyroidism, unspecified: Secondary | ICD-10-CM

## 2023-11-23 LAB — THYROID PANEL WITH TSH
Free Thyroxine Index: 3.9 — ABNORMAL HIGH (ref 1.4–3.8)
T3 Uptake: 33 % (ref 22–35)
T4, Total: 11.8 ug/dL (ref 5.1–11.9)
TSH: 0.3 m[IU]/L — ABNORMAL LOW (ref 0.40–4.50)

## 2023-11-25 ENCOUNTER — Other Ambulatory Visit: Payer: Self-pay | Admitting: Family Medicine

## 2023-11-25 ENCOUNTER — Encounter: Payer: Self-pay | Admitting: Family Medicine

## 2023-11-25 DIAGNOSIS — E039 Hypothyroidism, unspecified: Secondary | ICD-10-CM

## 2023-11-25 DIAGNOSIS — F988 Other specified behavioral and emotional disorders with onset usually occurring in childhood and adolescence: Secondary | ICD-10-CM

## 2023-11-25 MED ORDER — LEVOTHYROXINE SODIUM 150 MCG PO TABS: 150.0000 ug | ORAL_TABLET | Freq: Every day | ORAL | 3 refills | Status: DC

## 2023-11-25 NOTE — Telephone Encounter (Signed)
 Requesting: methylphenidate 10mg  Contract: Yes UDS: 12/03/22 Last Visit: 08/27/2023 Next Visit: Visit date not found Last Refill: 10/28/23  Please Advise

## 2023-11-27 MED ORDER — METHYLPHENIDATE HCL 10 MG PO TABS: 10.0000 mg | ORAL_TABLET | Freq: Three times a day (TID) | ORAL | 0 refills | Status: DC

## 2023-12-08 ENCOUNTER — Encounter: Payer: Self-pay | Admitting: Oncology

## 2023-12-09 ENCOUNTER — Other Ambulatory Visit: Payer: Self-pay | Admitting: *Deleted

## 2023-12-09 DIAGNOSIS — C189 Malignant neoplasm of colon, unspecified: Secondary | ICD-10-CM

## 2023-12-09 NOTE — Telephone Encounter (Signed)
Printed message for MD to review

## 2023-12-10 ENCOUNTER — Telehealth: Payer: Self-pay | Admitting: Oncology

## 2023-12-10 NOTE — Telephone Encounter (Signed)
 Left patient a vm regarding upcoming appointment

## 2023-12-12 ENCOUNTER — Inpatient Hospital Stay: Attending: Oncology

## 2023-12-25 ENCOUNTER — Other Ambulatory Visit: Payer: Self-pay | Admitting: Family Medicine

## 2023-12-25 DIAGNOSIS — F988 Other specified behavioral and emotional disorders with onset usually occurring in childhood and adolescence: Secondary | ICD-10-CM

## 2023-12-26 MED ORDER — METHYLPHENIDATE HCL 10 MG PO TABS: 10.0000 mg | ORAL_TABLET | Freq: Three times a day (TID) | ORAL | 0 refills | Status: DC

## 2023-12-26 NOTE — Telephone Encounter (Signed)
 Requesting: Ritalin 10mg   Contract:12/03/22 UDS: 12/03/22 Last Visit: 08/27/23 Next Visit: None Last Refill: 11/27/23 #90 and 0RF   Please Advise

## 2024-01-13 ENCOUNTER — Encounter (INDEPENDENT_AMBULATORY_CARE_PROVIDER_SITE_OTHER): Payer: Self-pay

## 2024-01-26 ENCOUNTER — Encounter: Payer: Self-pay | Admitting: Family Medicine

## 2024-01-26 DIAGNOSIS — F988 Other specified behavioral and emotional disorders with onset usually occurring in childhood and adolescence: Secondary | ICD-10-CM

## 2024-01-27 NOTE — Telephone Encounter (Signed)
 Requesting: Ritalin  Contract: 12/03/2022 UDS: 12/03/2022 Last OV: 08/27/2023 Next OV: n/a Last Refill: 12/26/2023, #90--0 RF Database:   Please advise

## 2024-01-28 MED ORDER — METHYLPHENIDATE HCL 10 MG PO TABS: 10.0000 mg | ORAL_TABLET | Freq: Three times a day (TID) | ORAL | 0 refills | Status: DC

## 2024-02-25 ENCOUNTER — Encounter: Payer: Self-pay | Admitting: Family Medicine

## 2024-02-25 DIAGNOSIS — F988 Other specified behavioral and emotional disorders with onset usually occurring in childhood and adolescence: Secondary | ICD-10-CM

## 2024-02-25 MED ORDER — METHYLPHENIDATE HCL 10 MG PO TABS
10.0000 mg | ORAL_TABLET | Freq: Three times a day (TID) | ORAL | 0 refills | Status: DC
Start: 2024-02-25 — End: 2024-03-27

## 2024-02-25 NOTE — Telephone Encounter (Signed)
 Requesting: Ritalin  Contract: 11/2022 UDS: 11/2022 Last OV: 08/27/23 Next OV: n/a Last Refill: 01/28/24, #90--0 RF Database:   Please advise

## 2024-03-03 ENCOUNTER — Ambulatory Visit: Payer: Self-pay | Admitting: Family Medicine

## 2024-03-03 ENCOUNTER — Other Ambulatory Visit (INDEPENDENT_AMBULATORY_CARE_PROVIDER_SITE_OTHER)

## 2024-03-03 DIAGNOSIS — E039 Hypothyroidism, unspecified: Secondary | ICD-10-CM | POA: Diagnosis not present

## 2024-03-03 LAB — TSH: TSH: 3.09 u[IU]/mL (ref 0.35–5.50)

## 2024-03-06 ENCOUNTER — Encounter: Payer: Self-pay | Admitting: *Deleted

## 2024-03-11 ENCOUNTER — Encounter: Payer: Self-pay | Admitting: Family Medicine

## 2024-03-11 ENCOUNTER — Telehealth: Admitting: Family Medicine

## 2024-03-11 VITALS — Ht 66.0 in | Wt 162.0 lb

## 2024-03-11 DIAGNOSIS — M62830 Muscle spasm of back: Secondary | ICD-10-CM

## 2024-03-11 MED ORDER — CYCLOBENZAPRINE HCL 5 MG PO TABS: 5.0000 mg | ORAL_TABLET | Freq: Three times a day (TID) | ORAL | 1 refills | Status: DC | PRN

## 2024-03-11 MED ORDER — PREDNISONE 20 MG PO TABS: 40.0000 mg | ORAL_TABLET | Freq: Every day | ORAL | 0 refills | Status: AC

## 2024-03-11 NOTE — Progress Notes (Signed)
 Low right sided back pain Friday - leaned over and pulled back - pain Advil/ rest over the weekend Last two days okay  Last night leaned over and pulled again - pain Now having muscle spasms  Sitting makes it worse  Now doing advil and heating pad   Leaves Saturday on vacation Thinks related to lack of exercise due to chemo/cancer over the last 2 years

## 2024-03-11 NOTE — Progress Notes (Signed)
 Virtual Video Visit via MyChart Note  I connected with  [name] on 03/11/24 at 10:40 AM EDT by the video enabled telemedicine application for MyChart, and verified that I am speaking with the correct person using two identifiers.   I introduced myself as a Publishing rights manager with the practice. We discussed the limitations of evaluation and management by telemedicine and the availability of in person appointments. The patient expressed understanding and agreed to proceed.  Participating parties in this visit include: The patient and the nurse practitioner listed.  The patient is: At home I am: In the office - [facility]  Subjective:    CC:  Chief Complaint  Patient presents with   Back Pain    HPI: Shelby Warren is a 58 y.o. year old female presenting today via MyChart today for back pain.   Discussed the use of AI scribe software for clinical note transcription with the patient, who gave verbal consent to proceed.  History of Present Illness Shelby Warren is a 58 year old female with a history of cancer who presents with acute lower right back pain.  She has been experiencing acute lower right back pain that began on Friday when she leaned over to unplug a modem. The pain is localized to the middle of the right side of her lower back. Initially manageable, the pain worsened significantly after a similar movement on Tuesday evening while preparing for a trip.  The pain is severe, with muscle spasms that require her to use breathing techniques to manage. Movement exacerbates the pain, making it difficult for her to transition from standing to lying down. Last night, the pain was so intense that she required assistance from her husband to walk to the bathroom, taking 45 minutes to cover a short distance.  Currently, the pain is rated as a 7 or 8 out of 10, but she describes it as a 20 out of 10 during the night due to muscle spasms. The spasms cause  a tightening sensation across her back but do not radiate down her legs or cause numbness or tingling.  She has been taking Advil three times a day since the onset of pain, which provided some relief initially. She has a history of cancer and completed chemotherapy last year, resulting in a loss of muscle mass. She has been gradually increasing her physical activity, currently walking two miles a day, down from four miles prior to her illness.             Past medical history, Surgical history, Family history not pertinant except as noted below, Social history, Allergies, and medications have been entered into the medical record, reviewed, and corrections made.   Review of Systems:  All review of systems negative except what is listed in the HPI   Objective:    General:  Speaking clearly in complete sentences. Absent shortness of breath noted.   Alert and oriented x3.   Normal judgment.  Absent acute distress.   Impression and Recommendations:    Problem List Items Addressed This Visit   None Visit Diagnoses       Spasm of muscle of lower back    -  Primary   Relevant Medications   predniSONE  (DELTASONE ) 20 MG tablet   cyclobenzaprine  (FLEXERIL ) 5 MG tablet      Assessment & Plan Acute lower back pain with muscle spasms Severe lower right-sided back pain likely due to muscle spasm or strain.  Recent chemotherapy and reduced activity may have led to muscle deconditioning. Prednisone  expected to reduce inflammation; muscle relaxers to alleviate spasms. Advised against phentermine while on prednisone . - Initiate 5-day prednisone  course. Avoid concurrent NSAIDs. Discussed medication, risk/benefit, and symptoms to monitor for. - Prescribe muscle relaxer as needed, up to three times daily. - Recommend acetaminophen  for additional pain relief. - Advise heat therapy, gentle stretching, massage. - Suggest heating pad or lidocaine  patches, especially during travel. -  Encourage frequent stretching and movement during travel. - Consider imaging if symptoms persist or worsen.       Follow-up if symptoms worsen or fail to improve.    I discussed the assessment and treatment plan with the patient. The patient was provided an opportunity to ask questions and all were answered. The patient agreed with the plan and demonstrated an understanding of the instructions.   The patient was advised to call back or seek an in-person evaluation if the symptoms worsen or if the condition fails to improve as anticipated.   Everlina Hock, NP

## 2024-03-12 ENCOUNTER — Encounter: Payer: Self-pay | Admitting: Family Medicine

## 2024-03-12 DIAGNOSIS — M545 Low back pain, unspecified: Secondary | ICD-10-CM

## 2024-03-25 MED ORDER — NAPROXEN 500 MG PO TABS: 500.0000 mg | ORAL_TABLET | Freq: Two times a day (BID) | ORAL | 1 refills | Status: DC

## 2024-03-25 NOTE — Addendum Note (Signed)
 Addended by: ALMARIE WADDELL NOVAK on: 03/25/2024 09:57 AM   Modules accepted: Orders

## 2024-03-26 ENCOUNTER — Ambulatory Visit (HOSPITAL_BASED_OUTPATIENT_CLINIC_OR_DEPARTMENT_OTHER)
Admission: RE | Admit: 2024-03-26 | Discharge: 2024-03-26 | Disposition: A | Discharge status: Home / Self Care | Source: Ambulatory Visit | Attending: Family Medicine | Admitting: Family Medicine

## 2024-03-26 ENCOUNTER — Other Ambulatory Visit: Payer: Self-pay | Admitting: Family Medicine

## 2024-03-26 DIAGNOSIS — M545 Low back pain, unspecified: Secondary | ICD-10-CM | POA: Diagnosis present

## 2024-03-26 DIAGNOSIS — G4700 Insomnia, unspecified: Secondary | ICD-10-CM

## 2024-03-26 MED ORDER — PREDNISONE 20 MG PO TABS: 40.0000 mg | ORAL_TABLET | Freq: Every day | ORAL | 0 refills | Status: AC

## 2024-03-26 MED ORDER — TRAZODONE HCL 100 MG PO TABS
100.0000 mg | ORAL_TABLET | Freq: Every day | ORAL | 0 refills | Status: AC
Start: 2024-03-26 — End: ?

## 2024-03-26 NOTE — Addendum Note (Signed)
 Addended by: ALMARIE BIRMINGHAM B on: 03/26/2024 11:28 AM   Modules accepted: Orders

## 2024-03-26 NOTE — Telephone Encounter (Signed)
 Copied from CRM [id number]. Topic: Clinical - Medication Refill >> Mar 26, 2024  2:14 PM Geroldine GRADE wrote: Medication: traZODone  (DESYREL ) 100 MG tablet   Has the patient contacted their pharmacy? Yes (Agent: If no, request that the patient contact the pharmacy for the refill. If patient does not wish to contact the pharmacy document the reason why and proceed with request.) (Agent: If yes, when and what did the pharmacy advise?)  This is the patient's preferred pharmacy:  [id number] - [place], [place] - [phone] [place] [address] Phone: [phone] Fax: [phone]  Is this the correct pharmacy for this prescription? Yes If no, delete pharmacy and type the correct one.   Has the prescription been filled recently? No  Is the patient out of the medication? No  Has the patient been seen for an appointment in the last year OR does the patient have an upcoming appointment? Yes  Can we respond through MyChart? Yes  Agent: Please be advised that Rx refills may take up to 3 business days. We ask that you follow-up with your pharmacy.

## 2024-03-27 ENCOUNTER — Encounter: Payer: Self-pay | Admitting: Family Medicine

## 2024-03-27 ENCOUNTER — Ambulatory Visit: Payer: Self-pay | Admitting: Family Medicine

## 2024-03-27 ENCOUNTER — Other Ambulatory Visit: Payer: Self-pay | Admitting: Family Medicine

## 2024-03-27 DIAGNOSIS — F988 Other specified behavioral and emotional disorders with onset usually occurring in childhood and adolescence: Secondary | ICD-10-CM

## 2024-03-27 MED ORDER — METHYLPHENIDATE HCL 10 MG PO TABS
10.0000 mg | ORAL_TABLET | Freq: Three times a day (TID) | ORAL | 0 refills | Status: DC
Start: 2024-03-27 — End: 2024-03-30

## 2024-03-27 NOTE — Telephone Encounter (Signed)
 Requesting: methylphenidate  10mg   Contract: 12/03/22 UDS: 12/03/22 Last Visit: 08/27/23 Next Visit: None Last Refill: 02/25/24 #90 and 0RF   Please Advise

## 2024-03-30 ENCOUNTER — Other Ambulatory Visit: Payer: Self-pay

## 2024-03-30 DIAGNOSIS — F988 Other specified behavioral and emotional disorders with onset usually occurring in childhood and adolescence: Secondary | ICD-10-CM

## 2024-03-30 NOTE — Telephone Encounter (Signed)
 Copied from CRM [id number]. Topic: Clinical - Prescription Issue >> Mar 30, 2024  9:51 AM Larissa RAMAN wrote: Reason for CRM: Patient requesting to have medication, methylphenidate  (RITALIN ) 10 MG tablet, sent to a different pharmacy. She states her original pharmacy is out of stock and will not have the medication to mid July. Please resend prescription to:   [facility] [address] [place], [place] [address] [phone]

## 2024-03-30 NOTE — Telephone Encounter (Signed)
 Pharmacy change due to medication being out of stock

## 2024-03-31 ENCOUNTER — Other Ambulatory Visit: Payer: Self-pay | Admitting: Family Medicine

## 2024-03-31 MED ORDER — METHYLPHENIDATE HCL 10 MG PO TABS
10.0000 mg | ORAL_TABLET | Freq: Three times a day (TID) | ORAL | 0 refills | Status: DC
Start: 2024-03-31 — End: 2024-04-28

## 2024-03-31 MED ORDER — METHYLPHENIDATE HCL 10 MG PO TABS: 10.0000 mg | ORAL_TABLET | Freq: Three times a day (TID) | ORAL | 0 refills | Status: DC

## 2024-04-01 ENCOUNTER — Other Ambulatory Visit: Payer: Self-pay | Admitting: *Deleted

## 2024-04-01 DIAGNOSIS — C189 Malignant neoplasm of colon, unspecified: Secondary | ICD-10-CM

## 2024-04-09 ENCOUNTER — Other Ambulatory Visit: Payer: Self-pay | Admitting: Family Medicine

## 2024-04-09 DIAGNOSIS — M62830 Muscle spasm of back: Secondary | ICD-10-CM

## 2024-04-15 ENCOUNTER — Ambulatory Visit (HOSPITAL_BASED_OUTPATIENT_CLINIC_OR_DEPARTMENT_OTHER)
Admission: RE | Admit: 2024-04-15 | Discharge: 2024-04-15 | Disposition: A | Discharge status: Home / Self Care | Source: Ambulatory Visit | Attending: Oncology | Admitting: Oncology

## 2024-04-15 ENCOUNTER — Inpatient Hospital Stay: Attending: Oncology

## 2024-04-15 DIAGNOSIS — Z853 Personal history of malignant neoplasm of breast: Secondary | ICD-10-CM | POA: Diagnosis present

## 2024-04-15 DIAGNOSIS — E039 Hypothyroidism, unspecified: Secondary | ICD-10-CM | POA: Diagnosis not present

## 2024-04-15 DIAGNOSIS — J45909 Unspecified asthma, uncomplicated: Secondary | ICD-10-CM | POA: Diagnosis not present

## 2024-04-15 DIAGNOSIS — C189 Malignant neoplasm of colon, unspecified: Secondary | ICD-10-CM

## 2024-04-15 DIAGNOSIS — Z08 Encounter for follow-up examination after completed treatment for malignant neoplasm: Secondary | ICD-10-CM | POA: Diagnosis present

## 2024-04-15 LAB — CBC WITH DIFFERENTIAL (CANCER CENTER ONLY)
Abs Immature Granulocytes: 0.01 K/uL (ref 0.00–0.07)
Basophils Absolute: 0 K/uL (ref 0.0–0.1)
Basophils Relative: 1 %
Eosinophils Absolute: 0.4 K/uL (ref 0.0–0.5)
Eosinophils Relative: 8 %
HCT: 39.6 % (ref 36.0–46.0)
Hemoglobin: 13.4 g/dL (ref 12.0–15.0)
Immature Granulocytes: 0 %
Lymphocytes Relative: 27 %
Lymphs Abs: 1.4 K/uL (ref 0.7–4.0)
MCH: 30.8 pg (ref 26.0–34.0)
MCHC: 33.8 g/dL (ref 30.0–36.0)
MCV: 91 fL (ref 80.0–100.0)
Monocytes Absolute: 0.4 K/uL (ref 0.1–1.0)
Monocytes Relative: 7 %
Neutro Abs: 3 K/uL (ref 1.7–7.7)
Neutrophils Relative %: 57 %
Platelet Count: 268 K/uL (ref 150–400)
RBC: 4.35 MIL/uL (ref 3.87–5.11)
RDW: 12 % (ref 11.5–15.5)
WBC Count: 5.1 K/uL (ref 4.0–10.5)
nRBC: 0 % (ref 0.0–0.2)

## 2024-04-15 LAB — BASIC METABOLIC PANEL - CANCER CENTER ONLY
Anion gap: 9 (ref 5–15)
BUN: 15 mg/dL (ref 6–20)
CO2: 29 mmol/L (ref 22–32)
Calcium: 10 mg/dL (ref 8.9–10.3)
Chloride: 105 mmol/L (ref 98–111)
Creatinine: 0.91 mg/dL (ref 0.44–1.00)
GFR, Estimated: >60 mL/min (ref >60)
Glucose, Bld: 94 mg/dL (ref 70–99)
Potassium: 4.4 mmol/L (ref 3.5–5.1)
Sodium: 143 mmol/L (ref 135–145)

## 2024-04-15 LAB — CEA (ACCESS): CEA (CHCC): 2.07 ng/mL (ref 0.00–5.00)

## 2024-04-15 LAB — FERRITIN: Ferritin: 94 ng/mL (ref 11–307)

## 2024-04-15 MED ORDER — IOHEXOL 300 MG/ML  SOLN
100.0000 mL | Freq: Once | INTRAMUSCULAR | Status: AC | PRN
  Administered 2024-04-15: 80 mL via INTRAVENOUS

## 2024-04-20 ENCOUNTER — Ambulatory Visit: Admitting: Physical Medicine and Rehabilitation

## 2024-04-23 ENCOUNTER — Inpatient Hospital Stay (HOSPITAL_BASED_OUTPATIENT_CLINIC_OR_DEPARTMENT_OTHER): Payer: Commercial Managed Care - PPO | Admitting: Oncology

## 2024-04-23 VITALS — BP 125/83 | HR 83 | Temp 98.2°F | Resp 18 | Ht 66.0 in | Wt 166.9 lb

## 2024-04-23 DIAGNOSIS — C189 Malignant neoplasm of colon, unspecified: Secondary | ICD-10-CM

## 2024-04-23 DIAGNOSIS — Z08 Encounter for follow-up examination after completed treatment for malignant neoplasm: Secondary | ICD-10-CM | POA: Diagnosis not present

## 2024-04-23 NOTE — Progress Notes (Signed)
  [facility] OFFICE PROGRESS NOTE   Diagnosis: Colon cancer  INTERVAL HISTORY:   Shelby Warren returns as scheduled.  She generally feels well.  The hand/foot symptoms have resolved.  She has intermittent headaches.  She has nausea when she gets hot with exercise.  Objective:  Vital signs in last 24 hours:  Blood pressure 125/83, pulse 83, temperature 98.2 F (36.8 C), temperature source Temporal, resp. rate 18, height 5' 6 (1.676 m), weight 166 lb 14.4 oz (75.7 kg), last menstrual period 07/31/2018, SpO2 100%.     Lymphatics: No cervical, supraclavicular, axillary, or inguinal nodes Resp: Lungs clear bilaterally Cardio: Regular rate and rhythm GI: No hepatosplenomegaly, nontender, no mass Vascular: No leg edema  Skin: Palms and soles without erythema, mild callus formation at the soles   Lab Results:  Lab Results  Component Value Date   WBC 5.1 04/15/2024   HGB 13.4 04/15/2024   HCT 39.6 04/15/2024   MCV 91.0 04/15/2024   PLT 268 04/15/2024   NEUTROABS 3.0 04/15/2024    CMP  Lab Results  Component Value Date   NA 143 04/15/2024   K 4.4 04/15/2024   CL 105 04/15/2024   CO2 29 04/15/2024   GLUCOSE 94 04/15/2024   BUN 15 04/15/2024   CREATININE 0.91 04/15/2024   CALCIUM 10.0 04/15/2024   PROT 7.0 11/12/2022   ALBUMIN 4.3 11/12/2022   AST 18 11/12/2022   ALT 16 11/12/2022   ALKPHOS 30 (L) 11/12/2022   BILITOT 0.7 11/12/2022   GFRNONAA >60 04/15/2024    Lab Results  Component Value Date   CEA1 22.0 (H) 04/12/2022   CEA 2.07 04/15/2024    Medications: I have reviewed the patient's current medications.   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, stage IIa (T3N0), status post a right colectomy 04/12/2022  No macroscopic tumor perforation, negative resection margins 0/31 nodes, no tumor deposits, no lymphovascular perineural invasion, no loss of mismatch repair  protein expression CT abdomen/pelvis 04/11/2022-cecal mass with obstruction of the ostium of the appendix with evidence of acute appendicitis, lymphadenopathy in the ileocolic mesentery CT chest 04/13/2022-no evidence of metastatic disease, pneumoperitoneum and trace bilateral pleural effusions felt to be secondary to surgery Elevated preoperative CEA; CEA in normal range 05/14/2022 (1.83) Cycle 1 Xeloda  05/14/2022 Cycle 2 Xeloda  06/04/2022 Cycle 3 Xeloda  06/25/2022 Cycle 4 Xeloda  07/16/2022 Colonoscopy 07/31/2022-melanosis in the colon; patent side-to-side ileocolonic anastomosis; no specimens collected Cycle 5 Xeloda  08/06/2022 Cycle 6 Xeloda  08/27/2022, dose reduction secondary to hand/foot syndrome Cycle 7 Xeloda  09/17/2022 Cycle 8 Xeloda  10/04/2022 CT 04/15/2023-no evidence of recurrent disease Colonoscopy 08/01/2023-negative CTs 04/15/2024: No evidence of local recurrence or metastatic disease   Asthma ADD G2 P2 Hysterectomy Hypothyroid History of an anal fissure History of a colon polyp-sigmoid colon polyp removed 05/29/2017-mucosal prolapse type polyp, melanosis coli, no dysplasia or malignancy Hand-foot syndrome secondary to Xeloda -persistent despite the addition of Voltaren  gel Nausea secondary to Xeloda -Zofran  added beginning with cycle 6       Disposition: Ms. Silman is in clinical remission from colon cancer.  She will return for an office visit and CEA in 6 months.  She will continue colonoscopy surveillance with Dr. Stacia.  Arley Hof, MD  04/23/2024  11:40 AM

## 2024-04-28 ENCOUNTER — Encounter: Payer: Self-pay | Admitting: Family Medicine

## 2024-04-28 DIAGNOSIS — F988 Other specified behavioral and emotional disorders with onset usually occurring in childhood and adolescence: Secondary | ICD-10-CM

## 2024-04-28 MED ORDER — METHYLPHENIDATE HCL 10 MG PO TABS: 10.0000 mg | ORAL_TABLET | Freq: Three times a day (TID) | ORAL | 0 refills | Status: DC

## 2024-04-29 ENCOUNTER — Other Ambulatory Visit: Payer: Self-pay | Admitting: Family Medicine

## 2024-04-29 DIAGNOSIS — M545 Low back pain, unspecified: Secondary | ICD-10-CM

## 2024-05-11 ENCOUNTER — Ambulatory Visit: Admitting: Family Medicine

## 2024-05-11 ENCOUNTER — Encounter: Payer: Self-pay | Admitting: Family Medicine

## 2024-05-11 VITALS — BP 120/84 | HR 74 | Temp 97.8°F | Resp 18 | Ht 66.0 in | Wt 169.4 lb

## 2024-05-11 DIAGNOSIS — E038 Other specified hypothyroidism: Secondary | ICD-10-CM | POA: Diagnosis not present

## 2024-05-11 DIAGNOSIS — C182 Malignant neoplasm of ascending colon: Secondary | ICD-10-CM

## 2024-05-11 DIAGNOSIS — R5383 Other fatigue: Secondary | ICD-10-CM

## 2024-05-11 DIAGNOSIS — E039 Hypothyroidism, unspecified: Secondary | ICD-10-CM

## 2024-05-11 DIAGNOSIS — F988 Other specified behavioral and emotional disorders with onset usually occurring in childhood and adolescence: Secondary | ICD-10-CM

## 2024-05-11 DIAGNOSIS — U071 COVID-19: Secondary | ICD-10-CM

## 2024-05-11 DIAGNOSIS — J4540 Moderate persistent asthma, uncomplicated: Secondary | ICD-10-CM

## 2024-05-11 MED ORDER — ALBUTEROL SULFATE HFA 108 (90 BASE) MCG/ACT IN AERS: 2.0000 | INHALATION_SPRAY | Freq: Four times a day (QID) | RESPIRATORY_TRACT | 5 refills | Status: AC | PRN

## 2024-05-11 NOTE — Progress Notes (Signed)
 Subjective:    Patient ID: Shelby Warren, female    DOB: 03-Sep-1966, 58 y.o.   MRN: [id number]  Chief Complaint  Patient presents with   Hypothyroidism   ADD   Follow-up    HPI Patient is in today for f/u add and thyroid  .   Discussed the use of AI scribe software for clinical note transcription with the patient, who gave verbal consent to proceed.  History of Present Illness Shelby Warren is a 58 year old female who presents for a six-month follow-up.  She experiences persistent fatigue and questions whether it could be related to her thyroid , as she recalls having a thyroid  check two or three months ago. She is not currently on any cancer treatment as she has been in remission since January or February of this year. A CT scan was performed a couple of weeks ago.  She is currently taking Ritalin , which was prescribed a few weeks ago, and Synthroid , which she has enough of until February. She also takes Trazodone  for sleep but reports that it does not always help. She mentions sleeping only 45 minutes the night before last despite taking Trazodone . She took one and a quarter of her 100 mg dose.  She has a history of a partial hysterectomy and still has her ovaries. She is unsure if she is going through menopause as she has not been diagnosed with it. She is considering whether her symptoms could be related to hormonal changes.  She used her inhaler this morning due to seasonal allergies but is unsure of its effectiveness as it may be old. She has not used it in a long time and is unsure if she needs a new one.  She reports an incident where she was swarmed by wasps while watering flowers, causing her to panic due to her allergy  to bees. She does not currently have an EpiPen due to cost and uncertainty about her reaction to wasp stings.    Past Medical History:  Diagnosis Date   ADD (attention deficit disorder)    Allergic rhinitis    Asthma    B12 deficiency    Cancer  (HCC)    Hypothyroidism     Past Surgical History:  Procedure Laterality Date   ANAL FISSURE REPAIR     APPENDECTOMY N/A 04/12/2022   Procedure: APPENDECTOMY;  Surgeon: Dasie Leonor CROME, MD;  Location: [facility] OR;  Service: General;  Laterality: N/A;   LAPAROSCOPIC RIGHT HEMI COLECTOMY N/A 04/12/2022   Procedure: LAPAROSCOPIC RIGHT HEMI COLECTOMY AND TERMINAL ILEUM;  Surgeon: Dasie Leonor CROME, MD;  Location: [facility] OR;  Service: General;  Laterality: N/A;   laproscopic knee Right 2018   lasic eye surgery     LIVER BIOPSY Right 04/12/2022   Procedure: LIVER BIOPSY;  Surgeon: Dasie Leonor CROME, MD;  Location: [facility] OR;  Service: General;  Laterality: Right;   spincture muscle     TOTAL ABDOMINAL HYSTERECTOMY  07/04/2020   dr mat    Family History  Problem Relation Age of Onset   Diabetes Maternal Grandfather    Breast cancer Paternal Grandmother    Thyroid  disease Paternal Grandmother    Colon cancer Neg Hx    Esophageal cancer Neg Hx    Rectal cancer Neg Hx    Stomach cancer Neg Hx    Colon polyps Neg Hx     Social History   Socioeconomic History   Marital status: Married    Spouse name: Not on file  Number of children: 2   Years of education: Not on file   Highest education level: Not on file  Occupational History    Employer: [employer]   Occupation: Homemaker  Tobacco Use   Smoking status: Never   Smokeless tobacco: Never  Vaping Use   Vaping status: Never Used  Substance and Sexual Activity   Alcohol use: Yes    Comment: occasional    Drug use: No   Sexual activity: Not on file  Other Topics Concern   Not on file  Social History Narrative   Not on file   Social Drivers of Health   Financial Resource Strain: Not on file  Food Insecurity: Not on file  Transportation Needs: Not on file  Physical Activity: Not on file  Stress: Not on file  Social Connections: Not on file  Intimate Partner Violence: Not on file    Outpatient Medications Prior to Visit   Medication Sig Dispense Refill   Docusate Sodium (COLACE PO) Take 4 capsules by mouth daily.     famotidine  (PEPCID ) 40 MG tablet TAKE 1 TABLET(40 MG) BY MOUTH TWICE DAILY 180 tablet 1   fexofenadine (ALLEGRA) 180 MG tablet Take 180 mg by mouth daily.     levothyroxine  (SYNTHROID ) 150 MCG tablet Take 1 tablet (150 mcg total) by mouth daily. 90 tablet 3   methylphenidate  (RITALIN ) 10 MG tablet Take 1 tablet (10 mg total) by mouth 3 (three) times daily with meals. 90 tablet 0   methylphenidate  (RITALIN ) 10 MG tablet Take 1 tablet (10 mg total) by mouth 3 (three) times daily with meals. 90 tablet 0   naproxen  (NAPROSYN ) 500 MG tablet Take 1 tablet (500 mg total) by mouth 2 (two) times daily with a meal. NEEDS APPT FOR REFILLS 60 tablet 0   ondansetron  (ZOFRAN ) 8 MG tablet 8 mg every 8 (eight) hours as needed.     phentermine (ADIPEX-P) 37.5 MG tablet Take 37.5 mg by mouth every morning.     Polyethylene Glycol 3350  (MIRALAX  PO) Take 17 g by mouth daily.     prochlorperazine  (COMPAZINE ) 5 MG tablet Take 5 mg by mouth every 6 (six) hours as needed.     traZODone  (DESYREL ) 100 MG tablet Take 1 tablet (100 mg total) by mouth at bedtime. 90 tablet 0   albuterol  (VENTOLIN  HFA) 108 (90 Base) MCG/ACT inhaler Inhale 2 puffs into the lungs every 6 (six) hours as needed for wheezing or shortness of breath. (Patient not taking: Reported on 05/11/2024) 18 g 5   cyclobenzaprine  (FLEXERIL ) 5 MG tablet Take 2 tablets (10 mg total) by mouth at bedtime. (Patient not taking: Reported on 05/11/2024) 60 tablet 1   No facility-administered medications prior to visit.    Allergies  Allergen Reactions   Penicillins Hives and Swelling   Fish-Derived Products    Penicillin G Other (See Comments)   Strawberry Extract     Review of Systems  Constitutional:  Negative for fever and malaise/fatigue.  HENT:  Negative for congestion.   Eyes:  Negative for blurred vision.  Respiratory:  Negative for cough and shortness of  breath.   Cardiovascular:  Negative for chest pain, palpitations and leg swelling.  Gastrointestinal:  Negative for abdominal pain, blood in stool, nausea and vomiting.  Genitourinary:  Negative for dysuria and frequency.  Musculoskeletal:  Negative for back pain and falls.  Skin:  Negative for rash.  Neurological:  Negative for dizziness, loss of consciousness and headaches.  Endo/Heme/Allergies:  Negative  for environmental allergies.  Psychiatric/Behavioral:  Negative for depression. The patient is not nervous/anxious.        Objective:    Physical Exam Vitals and nursing note reviewed.  Constitutional:      General: She is not in acute distress.    Appearance: Normal appearance. She is well-developed.  HENT:     Head: Normocephalic and atraumatic.  Eyes:     General: No scleral icterus.       Right eye: No discharge.        Left eye: No discharge.  Cardiovascular:     Rate and Rhythm: Normal rate and regular rhythm.     Heart sounds: No murmur heard. Pulmonary:     Effort: Pulmonary effort is normal. No respiratory distress.     Breath sounds: Normal breath sounds.  Musculoskeletal:        General: Normal range of motion.     Cervical back: Normal range of motion and neck supple.     Right lower leg: No edema.     Left lower leg: No edema.  Skin:    General: Skin is warm and dry.  Neurological:     Mental Status: She is alert and oriented to person, place, and time.  Psychiatric:        Mood and Affect: Mood normal.        Behavior: Behavior normal.        Thought Content: Thought content normal.        Judgment: Judgment normal.     BP 120/84 (BP Location: Left Arm, Patient Position: Sitting, Cuff Size: Normal)   Pulse 74   Temp 97.8 F (36.6 C) (Oral)   Resp 18   Ht 5' 6 (1.676 m)   Wt 169 lb 6.4 oz (76.8 kg)   LMP 07/31/2018   SpO2 100%   BMI 27.34 kg/m  Wt Readings from Last 3 Encounters:  05/11/24 169 lb 6.4 oz (76.8 kg)  04/23/24 166 lb 14.4 oz  (75.7 kg)  03/11/24 162 lb (73.5 kg)    Diabetic Foot Exam - Simple   No data filed    Lab Results  Component Value Date   WBC 5.1 04/15/2024   HGB 13.4 04/15/2024   HCT 39.6 04/15/2024   PLT 268 04/15/2024   GLUCOSE 94 04/15/2024   CHOL 200 07/04/2012   TRIG 77.0 07/04/2012   HDL 61.30 07/04/2012   LDLCALC 123 (H) 07/04/2012   ALT 16 11/12/2022   AST 18 11/12/2022   NA 143 04/15/2024   K 4.4 04/15/2024   CL 105 04/15/2024   CREATININE 0.91 04/15/2024   BUN 15 04/15/2024   CO2 29 04/15/2024   TSH 3.09 03/03/2024   INR 1.0 04/11/2022   HGBA1C 4.8 04/12/2022    Lab Results  Component Value Date   TSH 3.09 03/03/2024   Lab Results  Component Value Date   WBC 5.1 04/15/2024   HGB 13.4 04/15/2024   HCT 39.6 04/15/2024   MCV 91.0 04/15/2024   PLT 268 04/15/2024   Lab Results  Component Value Date   NA 143 04/15/2024   K 4.4 04/15/2024   CO2 29 04/15/2024   GLUCOSE 94 04/15/2024   BUN 15 04/15/2024   CREATININE 0.91 04/15/2024   BILITOT 0.7 11/12/2022   ALKPHOS 30 (L) 11/12/2022   AST 18 11/12/2022   ALT 16 11/12/2022   PROT 7.0 11/12/2022   ALBUMIN 4.3 11/12/2022   CALCIUM 10.0 04/15/2024   ANIONGAP 9  04/15/2024   GFR 74.28 10/24/2021   Lab Results  Component Value Date   CHOL 200 07/04/2012   Lab Results  Component Value Date   HDL 61.30 07/04/2012   Lab Results  Component Value Date   LDLCALC 123 (H) 07/04/2012   Lab Results  Component Value Date   TRIG 77.0 07/04/2012   Lab Results  Component Value Date   CHOLHDL 3 07/04/2012   Lab Results  Component Value Date   HGBA1C 4.8 04/12/2022       Assessment & Plan:  Attention deficit disorder (ADD) without hyperactivity -     Drug Monitoring Panel [phone] , Urine  Hypothyroidism, unspecified type -     Thyroid  Panel With TSH  Cancer of right colon (HCC)  Other specified hypothyroidism -     Thyroid  Panel With TSH  Other fatigue -     Thyroid  Panel With TSH -     CBC with  Differential/Platelet -     Comprehensive metabolic panel with GFR -     Vitamin B12 -     VITAMIN D  25 Hydroxy (Vit-D Deficiency, Fractures) -     Estradiol   COVID-19 -     Albuterol  Sulfate HFA; Inhale 2 puffs into the lungs every 6 (six) hours as needed for wheezing or shortness of breath.  Dispense: 18 g; Refill: 5  Moderate persistent asthma without complication -     Albuterol  Sulfate HFA; Inhale 2 puffs into the lungs every 6 (six) hours as needed for wheezing or shortness of breath.  Dispense: 18 g; Refill: 5  Assessment and Plan Assessment & Plan Fatigue and Insomnia   Persistent fatigue and insomnia may be related to post-cancer treatment effects. Trazodone  at 100 mg is sometimes ineffective but worked after a night of poor sleep. Menopausal symptoms are considered due to retained ovaries post-hysterectomy. A recent CT scan showed cancer in remission. Order blood work to assess potential causes, including thyroid  function and menopausal status.  Hypothyroidism   Hypothyroidism is well-managed with Synthroid . Recent thyroid  function tests are normal, making fatigue unlikely due to thyroid  dysfunction.  Asthma   She used her inhaler this morning but is unsure of its effectiveness due to potential expiration. Prescribe a new inhaler to [facility] in [place].    Oseas Detty R Lowne Chase, DO

## 2024-05-12 ENCOUNTER — Ambulatory Visit: Payer: Self-pay | Admitting: Family Medicine

## 2024-05-12 LAB — COMPREHENSIVE METABOLIC PANEL WITH GFR
ALT: 19 U/L (ref 0–35)
AST: 21 U/L (ref 0–37)
Albumin: 4.3 g/dL (ref 3.5–5.2)
Alkaline Phosphatase: 35 U/L — ABNORMAL LOW (ref 39–117)
BUN: 13 mg/dL (ref 6–23)
CO2: 32 meq/L (ref 19–32)
Calcium: 9.3 mg/dL (ref 8.4–10.5)
Chloride: 102 meq/L (ref 96–112)
Creatinine, Ser: 0.81 mg/dL (ref 0.40–1.20)
GFR: 80.59 mL/min (ref >60.00)
Glucose, Bld: 119 mg/dL — ABNORMAL HIGH (ref 70–99)
Potassium: 4.2 meq/L (ref 3.5–5.1)
Sodium: 141 meq/L (ref 135–145)
Total Bilirubin: 0.5 mg/dL (ref 0.2–1.2)
Total Protein: 6.5 g/dL (ref 6.0–8.3)

## 2024-05-12 LAB — CBC WITH DIFFERENTIAL/PLATELET
Basophils Absolute: 0 K/uL (ref 0.0–0.1)
Basophils Relative: 1 % (ref 0.0–3.0)
Eosinophils Absolute: 0.4 K/uL (ref 0.0–0.7)
Eosinophils Relative: 7.6 % — ABNORMAL HIGH (ref 0.0–5.0)
HCT: 36.9 % (ref 36.0–46.0)
Hemoglobin: 12.5 g/dL (ref 12.0–15.0)
Lymphocytes Relative: 30.7 % (ref 12.0–46.0)
Lymphs Abs: 1.5 K/uL (ref 0.7–4.0)
MCHC: 33.9 g/dL (ref 30.0–36.0)
MCV: 92.2 fl (ref 78.0–100.0)
Monocytes Absolute: 0.4 K/uL (ref 0.1–1.0)
Monocytes Relative: 8.4 % (ref 3.0–12.0)
Neutro Abs: 2.5 K/uL (ref 1.4–7.7)
Neutrophils Relative %: 52.3 % (ref 43.0–77.0)
Platelets: 288 K/uL (ref 150.0–400.0)
RBC: 4 Mil/uL (ref 3.87–5.11)
RDW: 13.3 % (ref 11.5–15.5)
WBC: 4.8 K/uL (ref 4.0–10.5)

## 2024-05-12 LAB — THYROID PANEL WITH TSH
Free Thyroxine Index: 3.3 (ref 1.4–3.8)
T3 Uptake: 32 % (ref 22–35)
T4, Total: 10.3 ug/dL (ref 5.1–11.9)
TSH: 1.83 m[IU]/L (ref 0.40–4.50)

## 2024-05-12 LAB — VITAMIN B12: Vitamin B-12: 179 pg/mL — ABNORMAL LOW (ref 211–911)

## 2024-05-12 LAB — VITAMIN D 25 HYDROXY (VIT D DEFICIENCY, FRACTURES): VITD: 18.38 ng/mL — ABNORMAL LOW (ref 30.00–100.00)

## 2024-05-12 LAB — ESTRADIOL: Estradiol: 16 pg/mL

## 2024-05-13 LAB — DM TEMPLATE

## 2024-05-13 LAB — DRUG MONITORING PANEL 376104, URINE
Amphetamines: NEGATIVE ng/mL (ref <500)
Barbiturates: NEGATIVE ng/mL (ref <300)
Benzodiazepines: NEGATIVE ng/mL (ref <100)
Cocaine Metabolite: NEGATIVE ng/mL (ref <150)
Desmethyltramadol: NEGATIVE ng/mL (ref <100)
Opiates: NEGATIVE ng/mL (ref <100)
Oxycodone: NEGATIVE ng/mL (ref <100)
Tramadol: NEGATIVE ng/mL (ref <100)

## 2024-05-25 ENCOUNTER — Encounter: Payer: Self-pay | Admitting: Family Medicine

## 2024-05-25 DIAGNOSIS — F988 Other specified behavioral and emotional disorders with onset usually occurring in childhood and adolescence: Secondary | ICD-10-CM

## 2024-05-25 MED ORDER — METHYLPHENIDATE HCL 10 MG PO TABS
10.0000 mg | ORAL_TABLET | Freq: Three times a day (TID) | ORAL | 0 refills | Status: DC
Start: 2024-05-25 — End: 2024-06-24

## 2024-05-25 NOTE — Telephone Encounter (Signed)
 Requesting: Ritalin  Contract: 05/11/2024 UDS: 05/11/2024 Last OV: 05/11/2024 Next OV: n/a Last Refill: 04/28/2024, #90--0 RF Database:   Please advise

## 2024-06-24 ENCOUNTER — Encounter: Payer: Self-pay | Admitting: Family Medicine

## 2024-06-24 DIAGNOSIS — F988 Other specified behavioral and emotional disorders with onset usually occurring in childhood and adolescence: Secondary | ICD-10-CM

## 2024-06-24 NOTE — Telephone Encounter (Signed)
 Requesting: Ritalin  10mg   Contract:05/11/24 UDS: 05/11/24 Last Visit: 05/11/24 Next Visit: None Last Refill: 05/25/24 #90 and 0RF   Please Advise

## 2024-06-25 ENCOUNTER — Telehealth: Payer: Self-pay

## 2024-06-25 MED ORDER — METHYLPHENIDATE HCL 10 MG PO TABS: 10.0000 mg | ORAL_TABLET | Freq: Three times a day (TID) | ORAL | 0 refills | Status: DC

## 2024-06-25 NOTE — Telephone Encounter (Signed)
 The patient's appointment has been rescheduled to October 23, 2023, to accommodate the provider's updated office hours. A letter confirming the new appointment date has been mailed to the patient.

## 2024-07-09 ENCOUNTER — Encounter: Payer: Self-pay | Admitting: Family Medicine

## 2024-07-09 DIAGNOSIS — R1013 Epigastric pain: Secondary | ICD-10-CM

## 2024-07-09 MED ORDER — FAMOTIDINE 40 MG PO TABS: 40.0000 mg | ORAL_TABLET | Freq: Two times a day (BID) | ORAL | 1 refills | Status: AC

## 2024-07-10 ENCOUNTER — Other Ambulatory Visit: Payer: Self-pay | Admitting: Family Medicine

## 2024-07-10 DIAGNOSIS — M545 Low back pain, unspecified: Secondary | ICD-10-CM

## 2024-07-27 ENCOUNTER — Encounter: Payer: Self-pay | Admitting: Family Medicine

## 2024-07-27 ENCOUNTER — Other Ambulatory Visit: Payer: Self-pay | Admitting: Family Medicine

## 2024-07-27 DIAGNOSIS — F988 Other specified behavioral and emotional disorders with onset usually occurring in childhood and adolescence: Secondary | ICD-10-CM

## 2024-07-27 MED ORDER — METHYLPHENIDATE HCL 10 MG PO TABS: 10.0000 mg | ORAL_TABLET | Freq: Three times a day (TID) | ORAL | 0 refills | Status: DC

## 2024-07-27 NOTE — Telephone Encounter (Signed)
 Requesting: methylphenidate  10mg   Contract: 05/11/24 UDS: 05/11/24 Last Visit: 05/11/24 Next Visit: None Last Refill: 06/25/24 #90 and 0RF   Please Advise

## 2024-08-03 ENCOUNTER — Encounter: Payer: Self-pay | Admitting: Radiology

## 2024-08-18 ENCOUNTER — Encounter: Payer: Self-pay | Admitting: Family Medicine

## 2024-08-24 ENCOUNTER — Encounter: Payer: Self-pay | Admitting: Family Medicine

## 2024-08-24 DIAGNOSIS — F988 Other specified behavioral and emotional disorders with onset usually occurring in childhood and adolescence: Secondary | ICD-10-CM

## 2024-08-24 MED ORDER — METHYLPHENIDATE HCL 10 MG PO TABS: 10.0000 mg | ORAL_TABLET | Freq: Three times a day (TID) | ORAL | 0 refills | Status: DC

## 2024-08-24 NOTE — Telephone Encounter (Signed)
 Requesting: methylphenidate  10mg   Contract: 05/11/24 UDS: 05/11/24 Last Visit: 05/11/24 Next Visit: None Last Refill: 07/27/24 #90 and 0RF   Please Advise

## 2024-09-08 ENCOUNTER — Encounter: Payer: Self-pay | Admitting: Family Medicine

## 2024-09-13 ENCOUNTER — Encounter: Payer: Self-pay | Admitting: Family Medicine

## 2024-09-13 DIAGNOSIS — G4700 Insomnia, unspecified: Secondary | ICD-10-CM

## 2024-09-14 MED ORDER — TRAZODONE HCL 100 MG PO TABS: 100.0000 mg | ORAL_TABLET | Freq: Every day | ORAL | 1 refills | Status: AC

## 2024-09-28 ENCOUNTER — Encounter: Payer: Self-pay | Admitting: Family Medicine

## 2024-09-28 DIAGNOSIS — F988 Other specified behavioral and emotional disorders with onset usually occurring in childhood and adolescence: Secondary | ICD-10-CM

## 2024-09-28 MED ORDER — METHYLPHENIDATE HCL 10 MG PO TABS: 10.0000 mg | ORAL_TABLET | Freq: Three times a day (TID) | ORAL | 0 refills | Status: DC

## 2024-09-28 NOTE — Telephone Encounter (Signed)
 Requesting: Ritalin   Contract: 05/11/2024 UDS: 05/11/2024 Last OV: 05/11/2024 Next OV: n/a Last Refill: 08/24/2024, #90--0RF Database:   Please advise

## 2024-10-02 ENCOUNTER — Encounter: Payer: Self-pay | Admitting: Oncology

## 2024-10-21 ENCOUNTER — Encounter: Payer: Self-pay | Admitting: Family Medicine

## 2024-10-21 DIAGNOSIS — E039 Hypothyroidism, unspecified: Secondary | ICD-10-CM

## 2024-10-22 ENCOUNTER — Inpatient Hospital Stay: Attending: Nurse Practitioner

## 2024-10-22 ENCOUNTER — Inpatient Hospital Stay: Admitting: Nurse Practitioner

## 2024-10-22 ENCOUNTER — Ambulatory Visit: Admitting: Oncology

## 2024-10-22 ENCOUNTER — Encounter: Payer: Self-pay | Admitting: Nurse Practitioner

## 2024-10-22 ENCOUNTER — Encounter: Payer: Self-pay | Admitting: *Deleted

## 2024-10-22 VITALS — BP 124/80 | HR 76 | Temp 98.0°F | Resp 18 | Ht 66.0 in | Wt 164.5 lb

## 2024-10-22 DIAGNOSIS — C189 Malignant neoplasm of colon, unspecified: Secondary | ICD-10-CM | POA: Diagnosis not present

## 2024-10-22 LAB — CEA (ACCESS): CEA (CHCC): 2.85 ng/mL (ref 0.00–5.00)

## 2024-10-22 MED ORDER — LEVOTHYROXINE SODIUM 150 MCG PO TABS: 150.0000 ug | ORAL_TABLET | Freq: Every day | ORAL | 3 refills | Status: AC

## 2024-10-22 NOTE — Progress Notes (Signed)
 " [facility] OFFICE PROGRESS NOTE   Diagnosis: Colon cancer  INTERVAL HISTORY:   Shelby Warren returns as scheduled.  Beginning last week she has noted that her hands and feet flareup with redness and swelling when she becomes hot and or tired.  She has also noted facial flushing.  She notes increased dryness on her feet.  Symptoms are similar to when she was taking capecitabine . She complains of headaches and nausea if she eats normal.  To prevent the symptoms she feels she has to overeat.  No mouth sores.  No diarrhea.  Recent constipation despite a laxative and stool softener.  Objective:  Vital signs in last 24 hours:  Blood pressure 124/80, pulse 76, temperature 98 F (36.7 C), temperature source Temporal, resp. rate 18, height 5' 6 (1.676 m), weight 164 lb 8 oz (74.6 kg), last menstrual period 07/31/2018, SpO2 100%.    HEENT: No thrush or ulcers. Lymphatics: No palpable cervical, supraclavicular, axillary or inguinal lymph nodes. Resp: Lungs clear bilaterally. Cardio: Regular rate and rhythm. GI: No hepatosplenomegaly.  Nontender. Vascular: No leg edema. Neuro: Alert and oriented. Skin: Palms with mild erythema.  Soles are dry appearing.   Lab Results:  Lab Results  Component Value Date   WBC 4.8 05/11/2024   HGB 12.5 05/11/2024   HCT 36.9 05/11/2024   MCV 92.2 05/11/2024   PLT 288.0 05/11/2024   NEUTROABS 2.5 05/11/2024    Imaging:  No results found.  Medications: I have reviewed the patient's current medications.  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, stage IIa (T3N0), status post a right colectomy 04/12/2022  No macroscopic tumor perforation, negative resection margins 0/31 nodes, no tumor deposits, no lymphovascular perineural invasion, no loss of mismatch repair protein expression CT abdomen/pelvis 04/11/2022-cecal mass with obstruction of the ostium of the  appendix with evidence of acute appendicitis, lymphadenopathy in the ileocolic mesentery CT chest 04/13/2022-no evidence of metastatic disease, pneumoperitoneum and trace bilateral pleural effusions felt to be secondary to surgery Elevated preoperative CEA; CEA in normal range 05/14/2022 (1.83) Cycle 1 Xeloda  05/14/2022 Cycle 2 Xeloda  06/04/2022 Cycle 3 Xeloda  06/25/2022 Cycle 4 Xeloda  07/16/2022 Colonoscopy 07/31/2022-melanosis in the colon; patent side-to-side ileocolonic anastomosis; no specimens collected Cycle 5 Xeloda  08/06/2022 Cycle 6 Xeloda  08/27/2022, dose reduction secondary to hand/foot syndrome Cycle 7 Xeloda  09/17/2022 Cycle 8 Xeloda  10/04/2022 CT 04/15/2023-no evidence of recurrent disease Colonoscopy 08/01/2023-negative CTs 04/15/2024: No evidence of local recurrence or metastatic disease   Asthma ADD G2 P2 Hysterectomy Hypothyroid History of an anal fissure History of a colon polyp-sigmoid colon polyp removed 05/29/2017-mucosal prolapse type polyp, melanosis coli, no dysplasia or malignancy Hand-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  CEA remains in normal range.  She is experiencing hand-foot symptoms similar to when she was taking capecitabine , also having intermittent facial flushing.  Etiology unclear.  Question late onset symptoms related to capecitabine .  Referral placed to dermatology.  For the nausea and constipation we placed a referral to Dr. Stacia.  She will return for a CEA and office visit in 6 months.  We are available to see her sooner if needed.  Patient seen with Dr. Cloretta.  Shelby Warren ANP/GNP-BC   10/22/2024  8:14 AM  This was a shared visit with Shelby Warren.  Shelby Warren is  in remission from colon cancer.  She has recurrent dryness and erythema on her hands and feet.   This would be a very unusual presentation of delayed toxicity from capecitabine .  Her symptoms had resolved when I saw her in July 2025.  She may have a primary dermatology diagnosis such as eczema.  We will make a referral to dermatology.  The facial erythema could be related to a collagen vascular disease.  I doubt the nausea and constipation are related to capecitabine .  She will schedule an appointment with Dr. Stacia.  Arvella Hof, MD      "

## 2024-10-22 NOTE — Progress Notes (Signed)
 Routed today's office note and referral request to Dr. Stacia and Dr. Corey.

## 2024-10-23 ENCOUNTER — Other Ambulatory Visit

## 2024-10-23 ENCOUNTER — Ambulatory Visit: Admitting: Oncology

## 2024-10-26 ENCOUNTER — Telehealth: Payer: Self-pay | Admitting: *Deleted

## 2024-10-26 ENCOUNTER — Encounter: Payer: Self-pay | Admitting: Family Medicine

## 2024-10-26 NOTE — Telephone Encounter (Signed)
 Left message to call back

## 2024-10-26 NOTE — Telephone Encounter (Signed)
 Patient has been scheduled on 11/12/24 at 340 pm to see Dr Stacia in follow up.

## 2024-10-26 NOTE — Telephone Encounter (Signed)
-----   Message from Glendia Holt, MD sent at 10/25/2024  3:45 PM EST ----- Regarding: Clinic appointment Team,  Please book office visit with myself or Pod A APP for ongoing constipation, nausea. ----- Message ----- From: Steva Devere CROME, RN Sent: 10/22/2024   4:59 PM EST To: Glendia FORBES Holt, MD  Est pt--h/o stage II colon cancer; recent constipation and nausea. Requesting appointment

## 2024-10-27 ENCOUNTER — Other Ambulatory Visit: Payer: Self-pay | Admitting: Family Medicine

## 2024-10-27 DIAGNOSIS — F988 Other specified behavioral and emotional disorders with onset usually occurring in childhood and adolescence: Secondary | ICD-10-CM

## 2024-10-27 MED ORDER — METHYLPHENIDATE HCL 10 MG PO TABS: 10.0000 mg | ORAL_TABLET | Freq: Three times a day (TID) | ORAL | 0 refills | Status: AC

## 2024-10-28 ENCOUNTER — Other Ambulatory Visit: Payer: Self-pay | Admitting: Family Medicine

## 2024-10-28 DIAGNOSIS — M545 Low back pain, unspecified: Secondary | ICD-10-CM

## 2024-10-28 NOTE — Telephone Encounter (Signed)
 Left message to call back

## 2024-10-29 NOTE — Telephone Encounter (Signed)
 3rd message left for patient to call back. Following 3 failed attempts to speak with patient, I have also sent a mychart message since it appears she does check this frequently.

## 2024-11-05 ENCOUNTER — Ambulatory Visit: Admitting: Physician Assistant

## 2024-11-05 ENCOUNTER — Encounter: Payer: Self-pay | Admitting: Physician Assistant

## 2024-11-05 VITALS — BP 148/86

## 2024-11-05 DIAGNOSIS — L719 Rosacea, unspecified: Secondary | ICD-10-CM

## 2024-11-05 DIAGNOSIS — I781 Nevus, non-neoplastic: Secondary | ICD-10-CM

## 2024-11-05 DIAGNOSIS — R232 Flushing: Secondary | ICD-10-CM

## 2024-11-05 DIAGNOSIS — L989 Disorder of the skin and subcutaneous tissue, unspecified: Secondary | ICD-10-CM

## 2024-11-05 DIAGNOSIS — L309 Dermatitis, unspecified: Secondary | ICD-10-CM

## 2024-11-05 MED ORDER — IVERMECTIN 1 % EX CREA: 1.0000 "application " | TOPICAL_CREAM | Freq: Every day | CUTANEOUS | 5 refills | Status: AC

## 2024-11-05 MED ORDER — TRIAMCINOLONE ACETONIDE 0.1 % EX CREA: 1.0000 | TOPICAL_CREAM | Freq: Every day | CUTANEOUS | 1 refills | Status: AC | PRN

## 2024-11-05 NOTE — Patient Instructions (Signed)

## 2024-11-05 NOTE — Progress Notes (Signed)
" ° ° °  Follow-Up Visit   Subjective  Shelby Warren is a 59 y.o. female ESTABLISHED PATIENT who is here for urgent referral for: Flushing of face, neck and hands. It started while she was on chemotherapy for colon cancer (Xeloda ). She finished chemo about 2 years ago but the flushing has continued. It is worse when she is hot. She denies pain but is bothered because people ask her what is wrong when her face turns so red. She gets similar areas on her hands and feet. Today she is not flushing but does have photos on her iPhone to share.   Also has itchy areas on her upper back.    The following portions of the chart were reviewed this encounter and updated as appropriate: medications, allergies, medical history  Review of Systems:  No other skin or systemic complaints except as noted in HPI or Assessment and Plan.  Objective  Well appearing patient in no apparent distress; mood and affect are within normal limits.   A focused examination was performed of the following areas: Face, neck, arms, legs and images on her iPhone.    Relevant exam findings are noted in the Assessment and Plan.    Assessment & Plan   FLUSHING  - DDX: ROSACEA VS OTHER Exam Mid face erythema with telangiectasias   Rosacea is a chronic progressive skin condition usually affecting the face of adults, causing redness and/or acne bumps. It is treatable but not curable. It sometimes affects the eyes (ocular rosacea) as well. It may respond to topical and/or systemic medication and can flare with stress, sun exposure, alcohol, exercise, topical steroids (including hydrocortisone/cortisone 10) and some foods.  Daily application of broad spectrum spf 30+ sunscreen to face is recommended to reduce flares.  Treatment Plan Discussed laser treatments   Start Ivermectin  1% cream daily.  Discussed evaluation with endocrinologist if condition does not improve with rosacea treatment. Recommend keeping a diary of when flushing  happens and what was going on at the time.   DERMATITIS UNSPECIFIED -- UPPER BACK  Exam: Pink patches of upper back  Treatment Plan:  TMC 0.1% cream Apply to affected area of upper back twice daily as needed.    FLUSHING   ROSACEA   DERMATITIS, UNSPECIFIED    Return for 3-4 months TBSE with Shelby Warren.  I, Roseline Hutchinson, CMA, am acting as scribe for Cyan Clippinger K, PA-C .   Documentation: I have reviewed the above documentation for accuracy and completeness, and I agree with the above.  Ainhoa Rallo K, PA-C    "

## 2024-11-06 ENCOUNTER — Other Ambulatory Visit: Payer: Self-pay | Admitting: Physician Assistant

## 2024-11-06 ENCOUNTER — Encounter: Payer: Self-pay | Admitting: Physician Assistant

## 2024-11-06 MED ORDER — IVERMECTIN 1 % EX CREA: TOPICAL_CREAM | CUTANEOUS | 2 refills | Status: AC

## 2024-11-06 NOTE — Progress Notes (Signed)
 See pt's MyChart message.  Will try [facility] pharmacy in [place].

## 2024-11-12 ENCOUNTER — Ambulatory Visit: Admitting: Gastroenterology

## 2024-11-20 ENCOUNTER — Ambulatory Visit: Admitting: Family Medicine

## 2025-02-15 ENCOUNTER — Ambulatory Visit: Admitting: Physician Assistant

## 2025-04-21 ENCOUNTER — Inpatient Hospital Stay: Admitting: Oncology

## 2025-04-21 ENCOUNTER — Inpatient Hospital Stay
